# Patient Record
Sex: Female | Born: 1965 | Race: White | Hispanic: No | Marital: Married | Smoking: Former smoker
Health system: Southern US, Community
[De-identification: ages and names within clinical notes are randomized; demographics above are authoritative.]

## PROBLEM LIST (undated history)

## (undated) DIAGNOSIS — J189 Pneumonia, unspecified organism: Secondary | ICD-10-CM

## (undated) DIAGNOSIS — I6529 Occlusion and stenosis of unspecified carotid artery: Secondary | ICD-10-CM

## (undated) DIAGNOSIS — I2510 Atherosclerotic heart disease of native coronary artery without angina pectoris: Secondary | ICD-10-CM

## (undated) DIAGNOSIS — E119 Type 2 diabetes mellitus without complications: Secondary | ICD-10-CM

## (undated) DIAGNOSIS — F32A Depression, unspecified: Secondary | ICD-10-CM

## (undated) DIAGNOSIS — R0602 Shortness of breath: Secondary | ICD-10-CM

## (undated) DIAGNOSIS — Z951 Presence of aortocoronary bypass graft: Secondary | ICD-10-CM

## (undated) DIAGNOSIS — F1991 Other psychoactive substance use, unspecified, in remission: Secondary | ICD-10-CM

## (undated) DIAGNOSIS — E538 Deficiency of other specified B group vitamins: Secondary | ICD-10-CM

## (undated) DIAGNOSIS — E785 Hyperlipidemia, unspecified: Secondary | ICD-10-CM

## (undated) DIAGNOSIS — F419 Anxiety disorder, unspecified: Secondary | ICD-10-CM

## (undated) DIAGNOSIS — T7840XA Allergy, unspecified, initial encounter: Secondary | ICD-10-CM

## (undated) DIAGNOSIS — R011 Cardiac murmur, unspecified: Secondary | ICD-10-CM

## (undated) DIAGNOSIS — Z87442 Personal history of urinary calculi: Secondary | ICD-10-CM

## (undated) DIAGNOSIS — I4891 Unspecified atrial fibrillation: Secondary | ICD-10-CM

## (undated) DIAGNOSIS — M1990 Unspecified osteoarthritis, unspecified site: Secondary | ICD-10-CM

## (undated) DIAGNOSIS — I639 Cerebral infarction, unspecified: Secondary | ICD-10-CM

## (undated) DIAGNOSIS — Z83438 Family history of other disorder of lipoprotein metabolism and other lipidemia: Secondary | ICD-10-CM

## (undated) DIAGNOSIS — I10 Essential (primary) hypertension: Secondary | ICD-10-CM

## (undated) DIAGNOSIS — E669 Obesity, unspecified: Secondary | ICD-10-CM

## (undated) DIAGNOSIS — R079 Chest pain, unspecified: Secondary | ICD-10-CM

## (undated) HISTORY — DX: Hyperlipidemia, unspecified: E78.5

## (undated) HISTORY — DX: Family history of other disorder of lipoprotein metabolism and other lipidemia: Z83.438

## (undated) HISTORY — DX: Other psychoactive substance use, unspecified, in remission: F19.91

## (undated) HISTORY — DX: Chest pain, unspecified: R07.9

## (undated) HISTORY — DX: Cerebral infarction, unspecified: I63.9

## (undated) HISTORY — DX: Anxiety disorder, unspecified: F41.9

## (undated) HISTORY — DX: Deficiency of other specified B group vitamins: E53.8

## (undated) HISTORY — DX: Shortness of breath: R06.02

## (undated) HISTORY — PX: DILATION AND CURETTAGE OF UTERUS: SHX78

## (undated) HISTORY — DX: Occlusion and stenosis of unspecified carotid artery: I65.29

## (undated) HISTORY — DX: Allergy, unspecified, initial encounter: T78.40XA

## (undated) HISTORY — DX: Obesity, unspecified: E66.9

## (undated) HISTORY — DX: Presence of aortocoronary bypass graft: Z95.1

## (undated) HISTORY — DX: Cardiac murmur, unspecified: R01.1

---

## 1998-05-23 ENCOUNTER — Encounter: Admission: RE | Admit: 1998-05-23 | Discharge: 1998-08-21 | Payer: Self-pay | Admitting: *Deleted

## 1998-12-29 ENCOUNTER — Inpatient Hospital Stay (HOSPITAL_COMMUNITY): Admission: AD | Admit: 1998-12-29 | Discharge: 1998-12-31 | Payer: Self-pay | Admitting: *Deleted

## 1999-01-01 ENCOUNTER — Encounter (HOSPITAL_COMMUNITY): Admission: RE | Admit: 1999-01-01 | Discharge: 1999-04-01 | Payer: Self-pay | Admitting: *Deleted

## 1999-04-05 ENCOUNTER — Encounter (HOSPITAL_COMMUNITY): Admission: RE | Admit: 1999-04-05 | Discharge: 1999-07-04 | Payer: Self-pay | Admitting: *Deleted

## 1999-07-27 ENCOUNTER — Encounter (HOSPITAL_COMMUNITY): Admission: RE | Admit: 1999-07-27 | Discharge: 1999-10-25 | Payer: Self-pay | Admitting: *Deleted

## 1999-10-27 ENCOUNTER — Encounter: Admission: RE | Admit: 1999-10-27 | Discharge: 2000-01-25 | Payer: Self-pay | Admitting: *Deleted

## 2000-02-24 ENCOUNTER — Encounter: Admission: RE | Admit: 2000-02-24 | Discharge: 2000-05-24 | Payer: Self-pay | Admitting: *Deleted

## 2000-05-26 ENCOUNTER — Encounter: Admission: RE | Admit: 2000-05-26 | Discharge: 2000-08-24 | Payer: Self-pay | Admitting: *Deleted

## 2000-08-25 ENCOUNTER — Encounter: Admission: RE | Admit: 2000-08-25 | Discharge: 2000-11-02 | Payer: Self-pay | Admitting: *Deleted

## 2002-04-26 ENCOUNTER — Ambulatory Visit (HOSPITAL_COMMUNITY): Admission: RE | Admit: 2002-04-26 | Discharge: 2002-04-26 | Payer: Self-pay | Admitting: Obstetrics and Gynecology

## 2002-04-26 ENCOUNTER — Encounter: Payer: Self-pay | Admitting: *Deleted

## 2006-04-05 ENCOUNTER — Encounter: Admission: RE | Admit: 2006-04-05 | Discharge: 2006-04-05 | Payer: Self-pay | Admitting: Internal Medicine

## 2009-09-14 HISTORY — PX: HYSTEROSCOPY W/ ENDOMETRIAL ABLATION: SUR665

## 2009-09-23 ENCOUNTER — Observation Stay (HOSPITAL_COMMUNITY): Admission: AD | Admit: 2009-09-23 | Discharge: 2009-09-25 | Payer: Self-pay | Admitting: Obstetrics and Gynecology

## 2009-09-30 ENCOUNTER — Ambulatory Visit (HOSPITAL_COMMUNITY): Admission: RE | Admit: 2009-09-30 | Discharge: 2009-09-30 | Payer: Self-pay | Admitting: Obstetrics & Gynecology

## 2009-10-27 ENCOUNTER — Inpatient Hospital Stay (HOSPITAL_COMMUNITY): Admission: AD | Admit: 2009-10-27 | Discharge: 2009-10-27 | Payer: Self-pay | Admitting: Obstetrics

## 2009-11-06 ENCOUNTER — Other Ambulatory Visit: Payer: Self-pay | Admitting: Obstetrics & Gynecology

## 2009-11-06 ENCOUNTER — Observation Stay (HOSPITAL_COMMUNITY): Admission: AD | Admit: 2009-11-06 | Discharge: 2009-11-06 | Payer: Self-pay | Admitting: Obstetrics & Gynecology

## 2009-11-12 ENCOUNTER — Telehealth: Payer: Self-pay

## 2010-11-30 LAB — CBC
HCT: 21.7 % — ABNORMAL LOW (ref 36.0–46.0)
HCT: 28.2 % — ABNORMAL LOW (ref 36.0–46.0)
Hemoglobin: 7.2 g/dL — ABNORMAL LOW (ref 12.0–15.0)
MCHC: 33.3 g/dL (ref 30.0–36.0)
MCHC: 33.4 g/dL (ref 30.0–36.0)
MCV: 88.8 fL (ref 78.0–100.0)
MCV: 90.2 fL (ref 78.0–100.0)
MCV: 91.5 fL (ref 78.0–100.0)
Platelets: 281 10*3/uL (ref 150–400)
Platelets: 354 10*3/uL (ref 150–400)
RBC: 2.44 MIL/uL — ABNORMAL LOW (ref 3.87–5.11)
RBC: 3.45 MIL/uL — ABNORMAL LOW (ref 3.87–5.11)
RDW: 17.6 % — ABNORMAL HIGH (ref 11.5–15.5)
RDW: 18.3 % — ABNORMAL HIGH (ref 11.5–15.5)
WBC: 10.6 10*3/uL — ABNORMAL HIGH (ref 4.0–10.5)
WBC: 12.4 10*3/uL — ABNORMAL HIGH (ref 4.0–10.5)

## 2010-11-30 LAB — CROSSMATCH
ABO/RH(D): O POS
Antibody Screen: NEGATIVE

## 2010-11-30 LAB — ABO/RH: ABO/RH(D): O POS

## 2010-11-30 LAB — PREGNANCY, URINE: Preg Test, Ur: NEGATIVE

## 2010-11-30 LAB — DIFFERENTIAL
Basophils Relative: 0 % (ref 0–1)
Eosinophils Absolute: 0.1 10*3/uL (ref 0.0–0.7)
Eosinophils Relative: 1 % (ref 0–5)
Lymphs Abs: 2.8 10*3/uL (ref 0.7–4.0)
Neutrophils Relative %: 72 % (ref 43–77)

## 2010-11-30 LAB — HEMOGLOBIN AND HEMATOCRIT, BLOOD
HCT: 23 % — ABNORMAL LOW (ref 36.0–46.0)
Hemoglobin: 7.6 g/dL — ABNORMAL LOW (ref 12.0–15.0)

## 2010-12-04 LAB — HEMOGLOBIN AND HEMATOCRIT, BLOOD: HCT: 33.1 % — ABNORMAL LOW (ref 36.0–46.0)

## 2010-12-04 LAB — CROSSMATCH: Antibody Screen: NEGATIVE

## 2010-12-04 LAB — CBC
Hemoglobin: 8.2 g/dL — ABNORMAL LOW (ref 12.0–15.0)
Platelets: 344 10*3/uL (ref 150–400)
Platelets: 345 10*3/uL (ref 150–400)
RBC: 2.67 MIL/uL — ABNORMAL LOW (ref 3.87–5.11)
RDW: 15.2 % (ref 11.5–15.5)
WBC: 11.5 10*3/uL — ABNORMAL HIGH (ref 4.0–10.5)
WBC: 9.7 10*3/uL (ref 4.0–10.5)

## 2010-12-04 LAB — PREGNANCY, URINE: Preg Test, Ur: NEGATIVE

## 2011-02-17 ENCOUNTER — Ambulatory Visit (INDEPENDENT_AMBULATORY_CARE_PROVIDER_SITE_OTHER): Payer: BC Managed Care – PPO | Admitting: Family Medicine

## 2011-02-17 ENCOUNTER — Encounter: Payer: Self-pay | Admitting: Family Medicine

## 2011-02-17 DIAGNOSIS — J309 Allergic rhinitis, unspecified: Secondary | ICD-10-CM

## 2011-02-17 DIAGNOSIS — F419 Anxiety disorder, unspecified: Secondary | ICD-10-CM | POA: Insufficient documentation

## 2011-02-17 DIAGNOSIS — J302 Other seasonal allergic rhinitis: Secondary | ICD-10-CM | POA: Insufficient documentation

## 2011-02-17 DIAGNOSIS — F411 Generalized anxiety disorder: Secondary | ICD-10-CM

## 2011-02-17 DIAGNOSIS — R011 Cardiac murmur, unspecified: Secondary | ICD-10-CM

## 2011-02-17 MED ORDER — FLUTICASONE PROPIONATE 50 MCG/ACT NA SUSP: 1.0000 | Freq: Every day | NASAL | Status: DC

## 2011-02-17 MED ORDER — CLONAZEPAM 0.5 MG PO TABS: ORAL_TABLET | ORAL | Status: DC

## 2011-02-17 NOTE — Progress Notes (Signed)
  Subjective:     Penny Huerta is a 45 y.o. female who presents for new evaluation and treatment of anxiety disorder. She has the following anxiety symptoms: difficulty concentrating, dizziness, feelings of losing control and psychomotor agitation. Onset of symptoms was approximately several years ago. Symptoms have been gradually improving since that time. She denies current suicidal and homicidal ideation. Family history significant for no psychiatric illness. Risk factors: hx anxiety and panic attack . Previous treatment includes benzodiazepines klonopin. She complains of the following medication side effects: none. The following portions of the patient's history were reviewed and updated as appropriate: allergies, current medications, past family history, past medical history, past social history, past surgical history and problem list.  Review of Systems Pertinent items are noted in HPI.    Objective:    BP 124/72  Pulse 89  Temp(Src) 98.1 F (36.7 C) (Oral)  Ht 5' 6.25" (1.683 m)  Wt 271 lb 9.6 oz (123.197 kg)  BMI 43.51 kg/m2  SpO2 98%  LMP 01/26/2011 General appearance: alert, cooperative, appears stated age and no distress Lungs: clear to auscultation bilaterally Heart: regular rate and rhythm, S1, S2 normal, no murmur, click, rub or gallop Neurologic: Alert and oriented X 3, normal strength and tone. Normal symmetric reflexes. Normal coordination and gait psych-- not suicidal, not anxious    Assessment:    anxiety . Possible organic contributing causes are: none.  Seasonal allergies Heart murmur Plan:    Medications: benzodiazepines klonopin. Handouts describing disease, natural history, and treatment were given to the patient. Instructed patient to contact office or on-call physician promptly should condition worsen or any new symptoms appear and provided on-call telephone numbers. IF THE PATIENT HAS ANY SUICIDAL OR HOMICIDAL IDEATIONS, CALL THE OFFICE, DISCUSS WITH A  SUPPORT MEMBER, OR GO TO THE ER IMMEDIATELY. Patient was agreeable with this plan. Follow up: 3 months.  con't flonase 2D echo

## 2011-02-17 NOTE — Patient Instructions (Signed)
Anxiety and Panic Attacks Your caregiver has informed you that you are having an anxiety or panic attack. There may be many forms of this. Most of the time these attacks come suddenly and without warning. They come at any time of day, including periods of sleep, and at any time of life. They may be strong and unexplained. Although panic attacks are very scary, they are physically harmless. Sometimes the cause of your anxiety is not known. Anxiety is a protective mechanism of the body in its fight or flight mechanism. Most of these perceived danger situations are actually nonphysical situations (such as anxiety over losing a job). CAUSES The causes of an anxiety or panic attack are many. Panic attacks may occur in otherwise healthy people given a certain set of circumstances. There may be a genetic cause for panic attacks. Some medications may also have anxiety as a side effect. SYMPTOMS Some of the most common feelings are:  Intense terror.  Dizziness, feeling faint.   Hot and cold flashes.   Fear of going crazy.   Feelings that nothing is real.   Sweating.   Shaking.   Chest pain or a fast heartbeat (palpitations).  Smothering, choking sensations.   Feelings of impending doom and that death is near.   Tingling of extremities, this may be from over breathing.   Altered reality (derealization).   Being detached from yourself (depersonalization).   Several symptoms can be present to make up anxiety or panic attacks. DIAGNOSIS The evaluation by your caregiver will depend on the type of symptoms you are experiencing. The diagnosis of anxiety or pain attack is made when no physical illness can be determined to be a cause of the symptoms. TREATMENT Treatment to prevent anxiety and panic attacks may include:  Avoidance of circumstances that cause anxiety.   Reassurance and relaxation.   Regular exercise.   Relaxation therapies, such as yoga.   Psychotherapy with a psychiatrist  or therapist.   Avoidance of caffeine, alcohol and illegal drugs.   Prescribed medication.  SEEK IMMEDIATE MEDICAL CARE IF:  You experience panic attack symptoms that are different than your usual symptoms.   You have any worsening or concerning symptoms.  Document Released: 08/31/2005 Document Re-Released: 02/18/2010 [employer] Patient Information 2011 [employer]. 

## 2011-02-24 ENCOUNTER — Other Ambulatory Visit (HOSPITAL_COMMUNITY): Payer: Self-pay | Admitting: Family Medicine

## 2011-02-24 DIAGNOSIS — R011 Cardiac murmur, unspecified: Secondary | ICD-10-CM

## 2011-02-26 ENCOUNTER — Ambulatory Visit (HOSPITAL_COMMUNITY): Payer: BC Managed Care – PPO

## 2011-03-10 ENCOUNTER — Other Ambulatory Visit: Payer: Self-pay | Admitting: Cardiology

## 2011-03-10 ENCOUNTER — Ambulatory Visit (INDEPENDENT_AMBULATORY_CARE_PROVIDER_SITE_OTHER): Payer: BC Managed Care – PPO | Admitting: *Deleted

## 2011-03-10 DIAGNOSIS — R011 Cardiac murmur, unspecified: Secondary | ICD-10-CM

## 2011-03-10 DIAGNOSIS — R002 Palpitations: Secondary | ICD-10-CM

## 2011-05-21 ENCOUNTER — Encounter: Payer: Self-pay | Admitting: Family Medicine

## 2011-05-21 ENCOUNTER — Ambulatory Visit (INDEPENDENT_AMBULATORY_CARE_PROVIDER_SITE_OTHER): Payer: BC Managed Care – PPO | Admitting: Family Medicine

## 2011-05-21 VITALS — BP 110/74 | HR 85 | Temp 99.1°F | Ht 67.0 in | Wt 269.4 lb

## 2011-05-21 DIAGNOSIS — Z1231 Encounter for screening mammogram for malignant neoplasm of breast: Secondary | ICD-10-CM

## 2011-05-21 DIAGNOSIS — J302 Other seasonal allergic rhinitis: Secondary | ICD-10-CM

## 2011-05-21 DIAGNOSIS — Z0000 Encounter for general adult medical examination without abnormal findings: Secondary | ICD-10-CM

## 2011-05-21 DIAGNOSIS — J309 Allergic rhinitis, unspecified: Secondary | ICD-10-CM

## 2011-05-21 LAB — CBC WITH DIFFERENTIAL/PLATELET
Basophils Absolute: 0 10*3/uL (ref 0.0–0.1)
Basophils Relative: 0.4 % (ref 0.0–3.0)
Eosinophils Relative: 0.8 % (ref 0.0–5.0)
Hemoglobin: 13.3 g/dL (ref 12.0–15.0)
Lymphocytes Relative: 32.6 % (ref 12.0–46.0)
Monocytes Relative: 6.1 % (ref 3.0–12.0)
Neutro Abs: 4.7 10*3/uL (ref 1.4–7.7)
RBC: 4.43 Mil/uL (ref 3.87–5.11)
RDW: 13.8 % (ref 11.5–14.6)
WBC: 7.9 10*3/uL (ref 4.5–10.5)

## 2011-05-21 LAB — HEPATIC FUNCTION PANEL
AST: 22 U/L (ref 0–37)
Albumin: 3.7 g/dL (ref 3.5–5.2)
Alkaline Phosphatase: 64 U/L (ref 39–117)

## 2011-05-21 LAB — BASIC METABOLIC PANEL
Calcium: 8.8 mg/dL (ref 8.4–10.5)
GFR: 119.97 mL/min (ref >60.00)
Sodium: 141 mEq/L (ref 135–145)

## 2011-05-21 LAB — LDL CHOLESTEROL, DIRECT: Direct LDL: 228 mg/dL

## 2011-05-21 LAB — LIPID PANEL: VLDL: 42 mg/dL — ABNORMAL HIGH (ref 0.0–40.0)

## 2011-05-21 NOTE — Progress Notes (Signed)
Subjective:     Penny Huerta is a 45 y.o. female and is here for a comprehensive physical exam. The patient reports no problems.  History   Social History  . Marital Status: Married    Spouse Name: N/A    Number of Children: N/A  . Years of Education: N/A   Occupational History  . educating Curator    Social History Main Topics  . Smoking status: Never Smoker   . Smokeless tobacco: Never Used  . Alcohol Use: No  . Drug Use: No  . Sexually Active: Yes -- Female partner(s)   Other Topics Concern  . Not on file   Social History Narrative  . No narrative on file   Health Maintenance  Topic Date Due  . Pap Smear  09/19/2012  . Tetanus/tdap  09/14/2013    The following portions of the patient's history were reviewed and updated as appropriate: allergies, current medications, past family history, past medical history, past social history, past surgical history and problem list.  Review of Systems Review of Systems  Constitutional: Negative for activity change, appetite change and fatigue.  HENT: Negative for hearing loss, congestion, tinnitus and ear discharge.  dentist ---due Eyes: Negative for visual disturbance (see optho q2y -- vision 20/20 =-- has reading glasses.  Respiratory: Negative for cough, chest tightness and shortness of breath.   Cardiovascular: Negative for chest pain, palpitations and leg swelling.  Gastrointestinal: Negative for abdominal pain, diarrhea, constipation and abdominal distention.  Genitourinary: Negative for urgency, frequency, decreased urine volume and difficulty urinating.  Musculoskeletal: Negative for back pain, arthralgias and gait problem.  Skin: Negative for color change, pallor and rash.  Neurological: Negative for dizziness, light-headedness, numbness and headaches.  Hematological: Negative for adenopathy. Does not bruise/bleed easily.  Psychiatric/Behavioral: Negative for suicidal ideas, confusion, sleep  disturbance, self-injury, dysphoric mood, decreased concentration and agitation.   Pt sees derm in [place]    Objective:    BP 110/74  Pulse 85  Temp(Src) 99.1 F (37.3 C) (Oral)  Ht 5\' 7"  (1.702 m)  Wt 269 lb 6.4 oz (122.199 kg)  BMI 42.19 kg/m2  SpO2 96%  General Appearance:    Alert, cooperative, no distress, appears stated age  Head:    Normocephalic, without obvious abnormality, atraumatic  Eyes:    PERRL, conjunctiva/corneas clear, EOM's intact, fundi    benign, both eyes  Ears:    Normal TM's and external ear canals, both ears  Nose:   Nares normal, septum midline, mucosa normal, no drainage    or sinus tenderness  Throat:   Lips, mucosa, and tongue normal; teeth and gums normal  Neck:   Supple, symmetrical, trachea midline, no adenopathy;    thyroid:  no enlargement/tenderness/nodules; no carotid   bruit or JVD  Back:     Symmetric, no curvature, ROM normal, no CVA tenderness  Lungs:     Clear to auscultation bilaterally, respirations unlabored  Chest Wall:    No tenderness or deformity   Heart:    Regular rate and rhythm, S1 and S2 normal, no murmur, rub   or gallop  Breast Exam:    gyn  Abdomen:     Soft, non-tender, bowel sounds active all four quadrants,    no masses, no organomegaly  Genitalia:    gyn  Rectal:  gyn  Extremities:   Extremities normal, atraumatic, no cyanosis or edema  Pulses:   2+ and symmetric all extremities  Skin:   Skin color, texture,  turgor normal, no rashes or lesions  Lymph nodes:   Cervical, supraclavicular, and axillary nodes normal  Neurologic:   CNII-XII intact, normal strength, sensation and reflexes    throughout   Assessment:    Healthy female exam.  Seasonal allergies   Plan:  ghm utd Check fasting labs   See After Visit Summary for Counseling Recommendations

## 2011-05-21 NOTE — Patient Instructions (Signed)

## 2011-05-27 ENCOUNTER — Telehealth: Payer: Self-pay

## 2011-05-27 NOTE — Telephone Encounter (Signed)
Message copied by Arnette Norris on Wed May 27, 2011  9:13 AM ------      Message from: Lelon Perla      Created: Tue May 26, 2011  9:53 PM       Pt at high risk MI and stroke------Cholesterol--- LDL goal <100,  HDL >40,  TG < 150.  Diet and exercise will increase HDL and decrease LDL and TG.  Fish,  Fish Oil, Flaxseed oil will also help increase the HDL and decrease Triglycerides.   Start Crestor 20 mg #30  1 po qhs, 2 refills.    Recheck labs in 3 months.---------    HDL diagnostics,  Lipid, hep, bmp, hgba1c   272.4  790.6

## 2011-05-27 NOTE — Telephone Encounter (Signed)
Dicussed with patient and she stated she was Absolutley Not taking any medications for her cholesterol. She stated her cholesterol has been high since she was a child and medications in the past caused he so many issues and she was unable to walk. She has been working with an Actor at [employer] to help her get it under control. I discussed the risks with patient of MI and Stroke and she voiced understanding, she stated she would take some diet information but will not take any medications. I advised I would let Dr.Lowne Know.        KP

## 2011-05-27 NOTE — Telephone Encounter (Signed)
noted 

## 2011-06-04 ENCOUNTER — Ambulatory Visit
Admission: RE | Admit: 2011-06-04 | Discharge: 2011-06-04 | Disposition: A | Discharge status: Home / Self Care | Payer: BC Managed Care – PPO | Source: Ambulatory Visit | Attending: Family Medicine | Admitting: Family Medicine

## 2011-06-04 DIAGNOSIS — Z1231 Encounter for screening mammogram for malignant neoplasm of breast: Secondary | ICD-10-CM

## 2011-08-19 ENCOUNTER — Other Ambulatory Visit: Payer: Self-pay | Admitting: Family Medicine

## 2011-08-19 NOTE — Telephone Encounter (Signed)
Faxed.   KP 

## 2011-08-19 NOTE — Telephone Encounter (Signed)
Last seen 05/21/11 and filled 02/17/11 # 45 with 2 refills. Please advise    KP

## 2011-10-22 ENCOUNTER — Encounter: Payer: Self-pay | Admitting: Family Medicine

## 2011-10-22 ENCOUNTER — Ambulatory Visit (INDEPENDENT_AMBULATORY_CARE_PROVIDER_SITE_OTHER): Payer: BC Managed Care – PPO | Admitting: Family Medicine

## 2011-10-22 VITALS — BP 122/84 | HR 87 | Temp 98.8°F | Wt 264.0 lb

## 2011-10-22 DIAGNOSIS — J329 Chronic sinusitis, unspecified: Secondary | ICD-10-CM

## 2011-10-22 MED ORDER — GUAIFENESIN-CODEINE 100-10 MG/5ML PO SYRP: 5.0000 mL | ORAL_SOLUTION | Freq: Three times a day (TID) | ORAL | Status: AC | PRN

## 2011-10-22 MED ORDER — AMOXICILLIN-POT CLAVULANATE 875-125 MG PO TABS: 1.0000 | ORAL_TABLET | Freq: Two times a day (BID) | ORAL | Status: AC

## 2011-10-22 NOTE — Progress Notes (Signed)
  Subjective:     Penny Huerta is a 46 y.o. female who presents for evaluation of symptoms of a URI. Symptoms include congestion, facial pain, nasal congestion, no  fever, non productive cough, purulent nasal discharge, sinus pressure and sore throat. Onset of symptoms was 1 week ago, and has been gradually worsening since that time. Treatment to date: cough suppressants and decongestants.  The following portions of the patient's history were reviewed and updated as appropriate: allergies, current medications, past family history, past medical history, past social history, past surgical history and problem list.  Review of Systems Pertinent items are noted in HPI.   Objective:    BP 122/84  Pulse 87  Temp(Src) 98.8 F (37.1 C) (Oral)  Wt 264 lb (119.75 kg)  SpO2 95% General appearance: alert, cooperative, appears stated age and no distress Ears: tm dull b/l  Nose: green discharge, moderate congestion, turbinates red, swollen, edematous, sinus tenderness bilateral Throat: lips, mucosa, and tongue normal; teeth and gums normal Neck: mild anterior cervical adenopathy, supple, symmetrical, trachea midline and thyroid not enlarged, symmetric, no tenderness/mass/nodules Lungs: clear to auscultation bilaterally Heart: S1, S2 normal Extremities: extremities normal, atraumatic, no cyanosis or edema   Assessment:    sinusitis   Plan:    Discussed the diagnosis and treatment of sinusitis. Suggested symptomatic OTC remedies. Nasal saline spray for congestion. Augmentin per orders. Nasal steroids per orders. Follow up as needed.

## 2011-10-22 NOTE — Patient Instructions (Signed)

## 2011-12-27 ENCOUNTER — Other Ambulatory Visit: Payer: Self-pay | Admitting: Family Medicine

## 2012-01-27 ENCOUNTER — Encounter: Payer: Self-pay | Admitting: Family Medicine

## 2012-01-27 ENCOUNTER — Telehealth: Payer: Self-pay | Admitting: Family Medicine

## 2012-01-27 ENCOUNTER — Ambulatory Visit (INDEPENDENT_AMBULATORY_CARE_PROVIDER_SITE_OTHER): Payer: BC Managed Care – PPO | Admitting: Family Medicine

## 2012-01-27 VITALS — BP 151/98 | HR 86 | Temp 98.6°F | Ht 67.0 in | Wt 268.0 lb

## 2012-01-27 DIAGNOSIS — L559 Sunburn, unspecified: Secondary | ICD-10-CM

## 2012-01-27 NOTE — Telephone Encounter (Addendum)
Per call from CAN, patient called in, was told our schedules are full for today.  Per CAN, patient with severe sunburn of both legs with swelling.  Patient to leave out of town for a 10 hour drive/ride tomorrow, and states patient should be seen within 72 hours.  Please return patient's call at [phone].

## 2012-01-27 NOTE — Telephone Encounter (Signed)
Discussed with patient and she stated she could not come in tomorrow. I made her aware she can try calling the [facility] office to be seen today and she agreed. I gave her the number and she said she would call.      KP

## 2012-01-27 NOTE — Telephone Encounter (Signed)
Caller: Ellissa/Patient; PCP: Lelon Perla.; CB#: [phone];  Calling today 01/27/12 regarding Sunburn On Leg, States Feet &. Ankles Swollen.  She is going to be traveling in a car tomorrow x10 hours.  Wants to know what she should do.  Got sunburn on Saturday.  Has not had a sunburn this bad before.  No blistering.  Emergent symptoms r/o by Sunburn guidelines with exception of sunburn worse than usual for similar sun exposure and on prescription medicaiton.  Care advice given. Advised pt she should be seen before going on her trip tomorrow.  No appts available at office, advised pt to urgent care for evaluation.  [facility] contacted office and spoke with Ranea to make sure no appts had been cancelled.  She took pt name and phone number and will give her a call to see if she can be seen in [facility] office.

## 2012-01-27 NOTE — Telephone Encounter (Signed)
Pt needs appointment today or early tomorrow

## 2012-01-27 NOTE — Progress Notes (Signed)
OFFICE NOTE  01/27/2012  CC:  Chief Complaint  Patient presents with  . Sunburn    calves/feet worse, swollen feet; concerned about swelling in feet as she has a 10 hour drive tomorrow; using Aloe; happened Saturday     HPI: Patient is a 46 y.o. Caucasian female patient of Dr. Ernst Spell who is here for sunburn. Five days ago was sitting in sun long time watching daughter play beach volleyball-- with SPF 45 applied once prior, no reapplication. Sun exposed areas, esp lower legs, got burnt and hurt a moderate amount. Some swollen anterior tibial areas initially have gone down and now ankles/tops of feet a bit swollen. No calf pain.  She wonders about going on a long car trip tomorrow and whether any special things need to be done.   Pertinent PMH:  Past Medical History  Diagnosis Date  . Allergy   . Hyperlipidemia   . Heart murmur     ARRHYTHMIA  . Anxiety     MEDS:  Outpatient Prescriptions Prior to Visit  Medication Sig Dispense Refill  . clonazePAM (KLONOPIN) 0.5 MG tablet take 1/2 tablet by mouth three times a day if needed  45 tablet  2  . fluticasone (FLONASE) 50 MCG/ACT nasal spray instill 1 spray into each nostril once daily  16 g  5  . metroNIDAZOLE (METROCREAM) 0.75 % cream Apply 1 application topically 2 (two) times daily.        . Nutritional Supplements (NUTRITIONAL SUPPLEMENT PO) Take 2 scoop by mouth daily.        . Pseudoephedrine-Ibuprofen (ADVIL COLD/SINUS PO) Take 1 tablet by mouth as needed.          PE: Blood pressure 151/98, pulse 86, temperature 98.6 F (37 C), temperature source Temporal, height 5\' 7"  (1.702 m), weight 268 lb (121.564 kg), last menstrual period 01/06/2012. Gen: Alert, well appearing.  Patient is oriented to person, place, time, and situation. Pleasant affect. Deep erythematous hue to anteior tibial areas, area around neck, sun-exposed areas of arms.  No vesicles or visible peeling.  No bruising.  No pitting edmea.   Her ankles and tops  of feet have a bit of doughy edema.  IMPRESSION AND PLAN: Sunburn: first degree. Continue advil tid with food. Cold compresses prn, elevate legs prn. For her car ride tomorrow I recommended she get up and ambulate q2h and she asked for a letter written to her husband to get him to do this.  FOLLOW UP: prn

## 2012-02-12 ENCOUNTER — Telehealth: Payer: Self-pay

## 2012-02-12 NOTE — Telephone Encounter (Signed)
Patient walked in to the office and state dsh would like Clonazepam removed form her medication list.

## 2012-02-14 ENCOUNTER — Other Ambulatory Visit: Payer: Self-pay | Admitting: Family Medicine

## 2012-02-14 NOTE — Telephone Encounter (Signed)
If she is not taking it we can remove it

## 2012-02-15 NOTE — Telephone Encounter (Signed)
Spoke with patient 6.3.13, advised that Dr.Lowne did approve removal of Clonazepam as per patient request. Patient stated she discontinued medication 08/2011

## 2012-10-29 ENCOUNTER — Other Ambulatory Visit: Payer: Self-pay

## 2012-11-05 ENCOUNTER — Other Ambulatory Visit: Payer: Self-pay | Admitting: Internal Medicine

## 2013-01-05 ENCOUNTER — Other Ambulatory Visit: Payer: Self-pay | Admitting: Family Medicine

## 2013-03-10 ENCOUNTER — Encounter: Payer: Self-pay | Admitting: Family Medicine

## 2013-03-10 ENCOUNTER — Ambulatory Visit (INDEPENDENT_AMBULATORY_CARE_PROVIDER_SITE_OTHER): Payer: BC Managed Care – PPO | Admitting: Family Medicine

## 2013-03-10 VITALS — BP 124/82 | HR 85 | Temp 98.3°F | Ht 66.5 in | Wt 271.8 lb

## 2013-03-10 DIAGNOSIS — Z0000 Encounter for general adult medical examination without abnormal findings: Secondary | ICD-10-CM

## 2013-03-10 DIAGNOSIS — Z9109 Other allergy status, other than to drugs and biological substances: Secondary | ICD-10-CM

## 2013-03-10 DIAGNOSIS — J309 Allergic rhinitis, unspecified: Secondary | ICD-10-CM

## 2013-03-10 DIAGNOSIS — J302 Other seasonal allergic rhinitis: Secondary | ICD-10-CM

## 2013-03-10 LAB — HEPATIC FUNCTION PANEL
ALT: 32 U/L (ref 0–35)
AST: 23 U/L (ref 0–37)
Albumin: 3.7 g/dL (ref 3.5–5.2)
Total Bilirubin: 0.7 mg/dL (ref 0.3–1.2)

## 2013-03-10 LAB — CBC WITH DIFFERENTIAL/PLATELET
Basophils Absolute: 0 10*3/uL (ref 0.0–0.1)
Eosinophils Relative: 0.7 % (ref 0.0–5.0)
HCT: 39.5 % (ref 36.0–46.0)
Hemoglobin: 13.2 g/dL (ref 12.0–15.0)
Lymphs Abs: 1.9 10*3/uL (ref 0.7–4.0)
MCV: 89.7 fl (ref 78.0–100.0)
Monocytes Absolute: 0.6 10*3/uL (ref 0.1–1.0)
Neutro Abs: 3.9 10*3/uL (ref 1.4–7.7)
Platelets: 243 10*3/uL (ref 150.0–400.0)
RDW: 13.3 % (ref 11.5–14.6)

## 2013-03-10 LAB — BASIC METABOLIC PANEL
BUN: 10 mg/dL (ref 6–23)
Chloride: 103 mEq/L (ref 96–112)
Glucose, Bld: 96 mg/dL (ref 70–99)
Potassium: 3.8 mEq/L (ref 3.5–5.1)
Sodium: 136 mEq/L (ref 135–145)

## 2013-03-10 LAB — POCT URINALYSIS DIPSTICK
Bilirubin, UA: NEGATIVE
Blood, UA: NEGATIVE
Glucose, UA: NEGATIVE
Ketones, UA: NEGATIVE
Nitrite, UA: NEGATIVE
Spec Grav, UA: 1.015
pH, UA: 6.5

## 2013-03-10 LAB — LIPID PANEL
Cholesterol: 295 mg/dL — ABNORMAL HIGH (ref 0–200)
Total CHOL/HDL Ratio: 11
Triglycerides: 292 mg/dL — ABNORMAL HIGH (ref 0.0–149.0)

## 2013-03-10 LAB — MICROALBUMIN / CREATININE URINE RATIO
Creatinine,U: 274.8 mg/dL
Microalb Creat Ratio: 0.4 mg/g (ref 0.0–30.0)

## 2013-03-10 LAB — LDL CHOLESTEROL, DIRECT: Direct LDL: 214.4 mg/dL

## 2013-03-10 LAB — TSH: TSH: 1.32 u[IU]/mL (ref 0.35–5.50)

## 2013-03-10 MED ORDER — FLUTICASONE PROPIONATE 50 MCG/ACT NA SUSP: NASAL | Status: DC

## 2013-03-10 NOTE — Assessment & Plan Note (Signed)
Refill flonase

## 2013-03-10 NOTE — Progress Notes (Signed)
Subjective:     Penny Huerta is a 47 y.o. female and is here for a comprehensive physical exam. The patient reports no problems.  History   Social History  . Marital Status: Married    Spouse Name: Penny Huerta    Number of Children: Penny Huerta  . Years of Education: Penny Huerta   Occupational History  . educating Curator    Social History Main Topics  . Smoking status: Former Smoker -- 20 years    Types: Cigarettes    Quit date: 09/14/1997  . Smokeless tobacco: Never Used  . Alcohol Use: No  . Drug Use: No  . Sexually Active: Yes -- Female partner(s)   Other Topics Concern  . Not on file   Social History Narrative   Exercise-- none   Health Maintenance  Topic Date Due  . Mammogram  06/03/2012  . Influenza Vaccine  05/15/2013  . Tetanus/tdap  09/14/2013  . Pap Smear  02/24/2015    The following portions of the patient's history were reviewed and updated as appropriate:  She  has a past medical history of Allergy; Hyperlipidemia; Heart murmur; and Anxiety. She  does not have any pertinent problems on file. She  has past surgical history that includes Hysteroscopy w/ endometrial ablation (2011). Her family history includes Diabetes in her mother and paternal grandfather; Heart disease in her maternal grandfather and maternal grandmother; Hyperlipidemia in her maternal grandfather, maternal grandmother, and mother; Hypertension in her mother and paternal grandfather; Ovarian cancer in her paternal grandmother; Stroke in her paternal grandfather; Sudden death in her maternal uncle; and Transient ischemic attack in her father and mother. She  reports that she quit smoking about 15 years ago. Her smoking use included Cigarettes. She smoked 0.00 packs per day for 20 years. She has never used smokeless tobacco. She reports that she does not drink alcohol or use illicit drugs. She has a current medication list which includes the following prescription(s): fluticasone, ibuprofen, and  pseudoephedrine-ibuprofen. Current Outpatient Prescriptions on File Prior to Visit  Medication Sig Dispense Refill  . fluticasone (FLONASE) 50 MCG/ACT nasal spray 1 spray into each nostril once daily--- office visit due now  16 g  0  . ibuprofen (ADVIL,MOTRIN) 200 MG tablet Take 800 mg by mouth every 6 (six) hours as needed.      . Pseudoephedrine-Ibuprofen (ADVIL COLD/SINUS PO) Take 1 tablet by mouth as needed.         No current facility-administered medications on file prior to visit.   She is allergic to caffeine; alprazolam; citalopram hydrobromide; nitrofurantoin monohyd macro; statins; and sulfa drugs cross reactors..  Review of Systems Review of Systems  Constitutional: Negative for activity change, appetite change and fatigue.  HENT: Negative for hearing loss, congestion, tinnitus and ear discharge.  dentist last-- 1 1/2 years ago Eyes: Negative for visual disturbance (see optho --due) Respiratory: Negative for cough, chest tightness and shortness of breath.   Cardiovascular: Negative for chest pain, palpitations and leg swelling.  Gastrointestinal: Negative for abdominal pain, diarrhea, constipation and abdominal distention.  Genitourinary: Negative for urgency, frequency, decreased urine volume and difficulty urinating.  Musculoskeletal: Negative for back pain, arthralgias and gait problem.  Skin: Negative for color change, pallor and rash.  Neurological: Negative for dizziness, light-headedness, numbness and headaches.  Hematological: Negative for adenopathy. Does not bruise/bleed easily.  Psychiatric/Behavioral: Negative for suicidal ideas, confusion, sleep disturbance, self-injury, dysphoric mood, decreased concentration and agitation.       Objective:    BP 124/82  Pulse 85  Temp(Src) 98.3 F (36.8 C) (Oral)  Ht 5' 6.5" (1.689 m)  Wt 271 lb 12.8 oz (123.288 kg)  BMI 43.22 kg/m2  SpO2 96% General appearance: alert, cooperative, appears stated age and no  distress Head: Normocephalic, without obvious abnormality, atraumatic Eyes: conjunctivae/corneas clear. PERRL, EOM's intact. Fundi benign. Ears: normal TM's and external ear canals both ears Nose: Nares normal. Septum midline. Mucosa normal. No drainage or sinus tenderness. Throat: lips, mucosa, and tongue normal; teeth and gums normal Neck: no adenopathy, supple, symmetrical, trachea midline and thyroid not enlarged, symmetric, no tenderness/mass/nodules Back: symmetric, no curvature. ROM normal. No CVA tenderness. Lungs: clear to auscultation bilaterally Breasts: normal appearance, no masses or tenderness Heart: S1, S2 normal Abdomen: soft, non-tender; bowel sounds normal; no masses,  no organomegaly Pelvic: deferred--gyn Extremities: extremities normal, atraumatic, no cyanosis or edema Pulses: 2+ and symmetric Skin: Skin color, texture, turgor normal. No rashes or lesions Lymph nodes: Cervical, supraclavicular, and axillary nodes normal. Neurologic: Alert and oriented X 3, normal strength and tone. Normal symmetric reflexes. Normal coordination and gait pych--nodepression, no anxiety      Assessment:    Healthy female exam.      Plan:    ghm utd Checks labs See After Visit Summary for Counseling Recommendations

## 2013-03-10 NOTE — Patient Instructions (Signed)
Preventive Care for Adults, Female A healthy lifestyle and preventive care can promote health and wellness. Preventive health guidelines for women include the following key practices.  A routine yearly physical is a good way to check with your caregiver about your health and preventive screening. It is a chance to share any concerns and updates on your health, and to receive a thorough exam.  Visit your dentist for a routine exam and preventive care every 6 months. Brush your teeth twice a day and floss once a day. Good oral hygiene prevents tooth decay and gum disease.  The frequency of eye exams is based on your age, health, family medical history, use of contact lenses, and other factors. Follow your caregiver's recommendations for frequency of eye exams.  Eat a healthy diet. Foods like vegetables, fruits, whole grains, low-fat dairy products, and lean protein foods contain the nutrients you need without too many calories. Decrease your intake of foods high in solid fats, added sugars, and salt. Eat the right amount of calories for you.Get information about a proper diet from your caregiver, if necessary.  Regular physical exercise is one of the most important things you can do for your health. Most adults should get at least 150 minutes of moderate-intensity exercise (any activity that increases your heart rate and causes you to sweat) each week. In addition, most adults need muscle-strengthening exercises on 2 or more days a week.  Maintain a healthy weight. The body mass index (BMI) is a screening tool to identify possible weight problems. It provides an estimate of body fat based on height and weight. Your caregiver can help determine your BMI, and can help you achieve or maintain a healthy weight.For adults 20 years and older:  A BMI below 18.5 is considered underweight.  A BMI of 18.5 to 24.9 is normal.  A BMI of 25 to 29.9 is considered overweight.  A BMI of 30 and above is  considered obese.  Maintain normal blood lipids and cholesterol levels by exercising and minimizing your intake of saturated fat. Eat a balanced diet with plenty of fruit and vegetables. Blood tests for lipids and cholesterol should begin at age 20 and be repeated every 5 years. If your lipid or cholesterol levels are high, you are over 50, or you are at high risk for heart disease, you may need your cholesterol levels checked more frequently.Ongoing high lipid and cholesterol levels should be treated with medicines if diet and exercise are not effective.  If you smoke, find out from your caregiver how to quit. If you do not use tobacco, do not start.  If you are pregnant, do not drink alcohol. If you are breastfeeding, be very cautious about drinking alcohol. If you are not pregnant and choose to drink alcohol, do not exceed 1 drink per day. One drink is considered to be 12 ounces (355 mL) of beer, 5 ounces (148 mL) of wine, or 1.5 ounces (44 mL) of liquor.  Avoid use of street drugs. Do not share needles with anyone. Ask for help if you need support or instructions about stopping the use of drugs.  High blood pressure causes heart disease and increases the risk of stroke. Your blood pressure should be checked at least every 1 to 2 years. Ongoing high blood pressure should be treated with medicines if weight loss and exercise are not effective.  If you are 55 to 46 years old, ask your caregiver if you should take aspirin to prevent strokes.  Diabetes   screening involves taking a blood sample to check your fasting blood sugar level. This should be done once every 3 years, after age 45, if you are within normal weight and without risk factors for diabetes. Testing should be considered at a younger age or be carried out more frequently if you are overweight and have at least 1 risk factor for diabetes.  Breast cancer screening is essential preventive care for women. You should practice "breast  self-awareness." This means understanding the normal appearance and feel of your breasts and may include breast self-examination. Any changes detected, no matter how small, should be reported to a caregiver. Women in their 20s and 30s should have a clinical breast exam (CBE) by a caregiver as part of a regular health exam every 1 to 3 years. After age 40, women should have a CBE every year. Starting at age 40, women should consider having a mammography (breast X-ray test) every year. Women who have a family history of breast cancer should talk to their caregiver about genetic screening. Women at a high risk of breast cancer should talk to their caregivers about having magnetic resonance imaging (MRI) and a mammography every year.  The Pap test is a screening test for cervical cancer. A Pap test can show cell changes on the cervix that might become cervical cancer if left untreated. A Pap test is a procedure in which cells are obtained and examined from the lower end of the uterus (cervix).  Women should have a Pap test starting at age 21.  Between ages 21 and 29, Pap tests should be repeated every 2 years.  Beginning at age 30, you should have a Pap test every 3 years as long as the past 3 Pap tests have been normal.  Some women have medical problems that increase the chance of getting cervical cancer. Talk to your caregiver about these problems. It is especially important to talk to your caregiver if a new problem develops soon after your last Pap test. In these cases, your caregiver may recommend more frequent screening and Pap tests.  The above recommendations are the same for women who have or have not gotten the vaccine for human papillomavirus (HPV).  If you had a hysterectomy for a problem that was not cancer or a condition that could lead to cancer, then you no longer need Pap tests. Even if you no longer need a Pap test, a regular exam is a good idea to make sure no other problems are  starting.  If you are between ages 65 and 70, and you have had normal Pap tests going back 10 years, you no longer need Pap tests. Even if you no longer need a Pap test, a regular exam is a good idea to make sure no other problems are starting.  If you have had past treatment for cervical cancer or a condition that could lead to cancer, you need Pap tests and screening for cancer for at least 20 years after your treatment.  If Pap tests have been discontinued, risk factors (such as a new sexual partner) need to be reassessed to determine if screening should be resumed.  The HPV test is an additional test that may be used for cervical cancer screening. The HPV test looks for the virus that can cause the cell changes on the cervix. The cells collected during the Pap test can be tested for HPV. The HPV test could be used to screen women aged 30 years and older, and should   be used in women of any age who have unclear Pap test results. After the age of 30, women should have HPV testing at the same frequency as a Pap test.  Colorectal cancer can be detected and often prevented. Most routine colorectal cancer screening begins at the age of 50 and continues through age 75. However, your caregiver may recommend screening at an earlier age if you have risk factors for colon cancer. On a yearly basis, your caregiver may provide home test kits to check for hidden blood in the stool. Use of a small camera at the end of a tube, to directly examine the colon (sigmoidoscopy or colonoscopy), can detect the earliest forms of colorectal cancer. Talk to your caregiver about this at age 50, when routine screening begins. Direct examination of the colon should be repeated every 5 to 10 years through age 75, unless early forms of pre-cancerous polyps or small growths are found.  Hepatitis C blood testing is recommended for all people born from 1945 through 1965 and any individual with known risks for hepatitis C.  Practice  safe sex. Use condoms and avoid high-risk sexual practices to reduce the spread of sexually transmitted infections (STIs). STIs include gonorrhea, chlamydia, syphilis, trichomonas, herpes, HPV, and human immunodeficiency virus (HIV). Herpes, HIV, and HPV are viral illnesses that have no cure. They can result in disability, cancer, and death. Sexually active women aged 25 and younger should be checked for chlamydia. Older women with new or multiple partners should also be tested for chlamydia. Testing for other STIs is recommended if you are sexually active and at increased risk.  Osteoporosis is a disease in which the bones lose minerals and strength with aging. This can result in serious bone fractures. The risk of osteoporosis can be identified using a bone density scan. Women ages 65 and over and women at risk for fractures or osteoporosis should discuss screening with their caregivers. Ask your caregiver whether you should take a calcium supplement or vitamin D to reduce the rate of osteoporosis.  Menopause can be associated with physical symptoms and risks. Hormone replacement therapy is available to decrease symptoms and risks. You should talk to your caregiver about whether hormone replacement therapy is right for you.  Use sunscreen with sun protection factor (SPF) of 30 or more. Apply sunscreen liberally and repeatedly throughout the day. You should seek shade when your shadow is shorter than you. Protect yourself by wearing long sleeves, pants, a wide-brimmed hat, and sunglasses year round, whenever you are outdoors.  Once a month, do a whole body skin exam, using a mirror to look at the skin on your back. Notify your caregiver of new moles, moles that have irregular borders, moles that are larger than a pencil eraser, or moles that have changed in shape or color.  Stay current with required immunizations.  Influenza. You need a dose every fall (or winter). The composition of the flu vaccine  changes each year, so being vaccinated once is not enough.  Pneumococcal polysaccharide. You need 1 to 2 doses if you smoke cigarettes or if you have certain chronic medical conditions. You need 1 dose at age 65 (or older) if you have never been vaccinated.  Tetanus, diphtheria, pertussis (Tdap, Td). Get 1 dose of Tdap vaccine if you are younger than age 65, are over 65 and have contact with an infant, are a healthcare worker, are pregnant, or simply want to be protected from whooping cough. After that, you need a Td   booster dose every 10 years. Consult your caregiver if you have not had at least 3 tetanus and diphtheria-containing shots sometime in your life or have a deep or dirty wound.  HPV. You need this vaccine if you are a woman age 26 or younger. The vaccine is given in 3 doses over 6 months.  Measles, mumps, rubella (MMR). You need at least 1 dose of MMR if you were born in 1957 or later. You may also need a second dose.  Meningococcal. If you are age 19 to 21 and a first-year college student living in a residence hall, or have one of several medical conditions, you need to get vaccinated against meningococcal disease. You may also need additional booster doses.  Zoster (shingles). If you are age 60 or older, you should get this vaccine.  Varicella (chickenpox). If you have never had chickenpox or you were vaccinated but received only 1 dose, talk to your caregiver to find out if you need this vaccine.  Hepatitis A. You need this vaccine if you have a specific risk factor for hepatitis A virus infection or you simply wish to be protected from this disease. The vaccine is usually given as 2 doses, 6 to 18 months apart.  Hepatitis B. You need this vaccine if you have a specific risk factor for hepatitis B virus infection or you simply wish to be protected from this disease. The vaccine is given in 3 doses, usually over 6 months. Preventive Services / Frequency Ages 19 to 39  Blood  pressure check.** / Every 1 to 2 years.  Lipid and cholesterol check.** / Every 5 years beginning at age 20.  Clinical breast exam.** / Every 3 years for women in their 20s and 30s.  Pap test.** / Every 2 years from ages 21 through 29. Every 3 years starting at age 30 through age 65 or 70 with a history of 3 consecutive normal Pap tests.  HPV screening.** / Every 3 years from ages 30 through ages 65 to 70 with a history of 3 consecutive normal Pap tests.  Hepatitis C blood test.** / For any individual with known risks for hepatitis C.  Skin self-exam. / Monthly.  Influenza immunization.** / Every year.  Pneumococcal polysaccharide immunization.** / 1 to 2 doses if you smoke cigarettes or if you have certain chronic medical conditions.  Tetanus, diphtheria, pertussis (Tdap, Td) immunization. / A one-time dose of Tdap vaccine. After that, you need a Td booster dose every 10 years.  HPV immunization. / 3 doses over 6 months, if you are 26 and younger.  Measles, mumps, rubella (MMR) immunization. / You need at least 1 dose of MMR if you were born in 1957 or later. You may also need a second dose.  Meningococcal immunization. / 1 dose if you are age 19 to 21 and a first-year college student living in a residence hall, or have one of several medical conditions, you need to get vaccinated against meningococcal disease. You may also need additional booster doses.  Varicella immunization.** / Consult your caregiver.  Hepatitis A immunization.** / Consult your caregiver. 2 doses, 6 to 18 months apart.  Hepatitis B immunization.** / Consult your caregiver. 3 doses usually over 6 months. Ages 40 to 64  Blood pressure check.** / Every 1 to 2 years.  Lipid and cholesterol check.** / Every 5 years beginning at age 20.  Clinical breast exam.** / Every year after age 40.  Mammogram.** / Every year beginning at age 40   and continuing for as long as you are in good health. Consult with your  caregiver.  Pap test.** / Every 3 years starting at age 30 through age 65 or 70 with a history of 3 consecutive normal Pap tests.  HPV screening.** / Every 3 years from ages 30 through ages 65 to 70 with a history of 3 consecutive normal Pap tests.  Fecal occult blood test (FOBT) of stool. / Every year beginning at age 50 and continuing until age 75. You may not need to do this test if you get a colonoscopy every 10 years.  Flexible sigmoidoscopy or colonoscopy.** / Every 5 years for a flexible sigmoidoscopy or every 10 years for a colonoscopy beginning at age 50 and continuing until age 75.  Hepatitis C blood test.** / For all people born from 1945 through 1965 and any individual with known risks for hepatitis C.  Skin self-exam. / Monthly.  Influenza immunization.** / Every year.  Pneumococcal polysaccharide immunization.** / 1 to 2 doses if you smoke cigarettes or if you have certain chronic medical conditions.  Tetanus, diphtheria, pertussis (Tdap, Td) immunization.** / A one-time dose of Tdap vaccine. After that, you need a Td booster dose every 10 years.  Measles, mumps, rubella (MMR) immunization. / You need at least 1 dose of MMR if you were born in 1957 or later. You may also need a second dose.  Varicella immunization.** / Consult your caregiver.  Meningococcal immunization.** / Consult your caregiver.  Hepatitis A immunization.** / Consult your caregiver. 2 doses, 6 to 18 months apart.  Hepatitis B immunization.** / Consult your caregiver. 3 doses, usually over 6 months. Ages 65 and over  Blood pressure check.** / Every 1 to 2 years.  Lipid and cholesterol check.** / Every 5 years beginning at age 20.  Clinical breast exam.** / Every year after age 40.  Mammogram.** / Every year beginning at age 40 and continuing for as long as you are in good health. Consult with your caregiver.  Pap test.** / Every 3 years starting at age 30 through age 65 or 70 with a 3  consecutive normal Pap tests. Testing can be stopped between 65 and 70 with 3 consecutive normal Pap tests and no abnormal Pap or HPV tests in the past 10 years.  HPV screening.** / Every 3 years from ages 30 through ages 65 or 70 with a history of 3 consecutive normal Pap tests. Testing can be stopped between 65 and 70 with 3 consecutive normal Pap tests and no abnormal Pap or HPV tests in the past 10 years.  Fecal occult blood test (FOBT) of stool. / Every year beginning at age 50 and continuing until age 75. You may not need to do this test if you get a colonoscopy every 10 years.  Flexible sigmoidoscopy or colonoscopy.** / Every 5 years for a flexible sigmoidoscopy or every 10 years for a colonoscopy beginning at age 50 and continuing until age 75.  Hepatitis C blood test.** / For all people born from 1945 through 1965 and any individual with known risks for hepatitis C.  Osteoporosis screening.** / A one-time screening for women ages 65 and over and women at risk for fractures or osteoporosis.  Skin self-exam. / Monthly.  Influenza immunization.** / Every year.  Pneumococcal polysaccharide immunization.** / 1 dose at age 65 (or older) if you have never been vaccinated.  Tetanus, diphtheria, pertussis (Tdap, Td) immunization. / A one-time dose of Tdap vaccine if you are over   65 and have contact with an infant, are a healthcare worker, or simply want to be protected from whooping cough. After that, you need a Td booster dose every 10 years.  Varicella immunization.** / Consult your caregiver.  Meningococcal immunization.** / Consult your caregiver.  Hepatitis A immunization.** / Consult your caregiver. 2 doses, 6 to 18 months apart.  Hepatitis B immunization.** / Check with your caregiver. 3 doses, usually over 6 months. ** Family history and personal history of risk and conditions may change your caregiver's recommendations. Document Released: 10/27/2001 Document Revised: 11/23/2011  Document Reviewed: 01/26/2011 [employer] Patient Information 2014 [employer].  

## 2013-03-14 ENCOUNTER — Other Ambulatory Visit: Payer: Self-pay

## 2013-03-14 MED ORDER — ATORVASTATIN CALCIUM 20 MG PO TABS: 20.0000 mg | ORAL_TABLET | Freq: Every day | ORAL | Status: DC

## 2013-07-20 ENCOUNTER — Other Ambulatory Visit: Payer: Self-pay

## 2020-01-22 ENCOUNTER — Other Ambulatory Visit: Payer: Self-pay

## 2020-01-22 ENCOUNTER — Ambulatory Visit (INDEPENDENT_AMBULATORY_CARE_PROVIDER_SITE_OTHER): Payer: Managed Care, Other (non HMO) | Admitting: Surgical

## 2020-01-22 ENCOUNTER — Ambulatory Visit: Payer: Self-pay

## 2020-01-22 ENCOUNTER — Encounter: Payer: Self-pay | Admitting: Surgical

## 2020-01-22 VITALS — Ht 66.0 in | Wt 316.0 lb

## 2020-01-22 DIAGNOSIS — M1712 Unilateral primary osteoarthritis, left knee: Secondary | ICD-10-CM | POA: Diagnosis not present

## 2020-01-22 MED ORDER — MELOXICAM 15 MG PO TABS
15.0000 mg | ORAL_TABLET | Freq: Every day | ORAL | 0 refills | Status: DC
Start: 2020-01-22 — End: 2020-02-22

## 2020-01-22 MED ORDER — METHYLPREDNISOLONE ACETATE 40 MG/ML IJ SUSP
40.0000 mg | INTRAMUSCULAR | Status: AC | PRN
  Administered 2020-01-22: 40 mg via INTRA_ARTICULAR

## 2020-01-22 MED ORDER — BUPIVACAINE HCL 0.25 % IJ SOLN
4.0000 mL | INTRAMUSCULAR | Status: AC | PRN
  Administered 2020-01-22: 4 mL via INTRA_ARTICULAR

## 2020-01-22 MED ORDER — LIDOCAINE HCL 1 % IJ SOLN
5.0000 mL | INTRAMUSCULAR | Status: AC | PRN
  Administered 2020-01-22: 5 mL

## 2020-01-22 NOTE — Progress Notes (Addendum)
Office Visit Note   Patient: Penny Huerta           Date of Birth: 1965/10/23           MRN: [id number] Visit Date: 01/22/2020 Requested by: [address], [place], [address] [place],  [place] [address] PCP: Carollee Herter, Alferd Apa, DO  Subjective: Chief Complaint  Patient presents with  . Left Knee - Pain    HPI: Penny Huerta is a 54 y.o. female who presents to the office complaining of left knee pain.  Patient has had left knee pain for several months without acute incident or injury.  She complains of medial sided left knee pain.  Denies any groin pain, numbness/tingling, low back pain.  She does complain of her knee giving way on her on occasion but denies any mechanical symptoms.  Pain does wake her up at night.  She takes Advil as needed for pain.  She also has been doing straight leg raises which has helped with her pain to some degree.  She denies any history of knee surgery, knee injection, knee MRI.  She did have a brief bout of right Achilles tendinitis which caused her to favor her left leg and that is when this pain began.  She denies any history of diabetes..                ROS:  All systems reviewed are negative as they relate to the chief complaint within the history of present illness.  Patient denies fevers or chills.  Assessment & Plan: Visit Diagnoses:  1. Primary osteoarthritis of left knee     Plan: Patient is a 54 year old female presents complaint of left knee pain.  Pain is been ongoing for several months.  She complains of medial sided left knee pain without injury.  Radiographs taken today reveal left knee medial compartment narrowing without fracture or dislocation.  Impression is osteoarthritis of the left knee.  Discussed options available to patient.  Recommended nonloadbearing quad strengthening exercises.  Prescribed meloxicam for patient to take instead of Advil.  She has no history of heart attack/stroke, kidney disease, gastric bypass,  stomach ulcers.  Administered left knee injection today.  Patient tolerated the procedure well.  She will follow-up in 6 weeks for clinical recheck.  Follow-Up Instructions: No follow-ups on file.   Orders:  Orders Placed This Encounter  Procedures  . XR KNEE 3 VIEW LEFT   Meds ordered this encounter  Medications  . meloxicam (MOBIC) 15 MG tablet    Sig: Take 1 tablet (15 mg total) by mouth daily.    Dispense:  30 tablet    Refill:  0      Procedures: Large Joint Inj: L knee on 01/22/2020 2:47 PM Indications: diagnostic evaluation, joint swelling and pain Details: 18 G 1.5 in needle, superolateral approach  Arthrogram: No  Medications: 5 mL lidocaine 1 %; 40 mg methylPREDNISolone acetate 40 MG/ML; 4 mL bupivacaine 0.25 % Outcome: tolerated well, no immediate complications Procedure, treatment alternatives, risks and benefits explained, specific risks discussed. Consent was given by the patient. Immediately prior to procedure a time out was called to verify the correct patient, procedure, equipment, support staff and site/side marked as required. Patient was prepped and draped in the usual sterile fashion.       Clinical Data: No additional findings.  Objective: Vital Signs: Ht 5\' 6"  (1.676 m)   Wt (!) 316 lb (143.3 kg)  BMI 51.00 kg/m   Physical Exam:  Constitutional: Patient appears well-developed HEENT:  Head: Normocephalic Eyes:EOM are normal Neck: Normal range of motion Cardiovascular: Normal rate Pulmonary/chest: Effort normal Neurologic: Patient is alert Skin: Skin is warm Psychiatric: Patient has normal mood and affect  Ortho Exam:  Left knee Exam Tender to palpation over the medial joint line as well as the pes anserine bursa No effusion Extensor mechanism intact No TTP over the lateral jointlines, quad tendon, patellar tendon, patella, tibial tubercle, LCL/MCL insertions Stable to varus/valgus stresses.  Stable to anterior/posterior  drawer Extension to 0 degrees Flexion > 90 degrees  Specialty Comments:  No specialty comments available.  Imaging: No results found.   PMFS History: Patient Active Problem List   Diagnosis Date Noted  . Anxiety 02/17/2011  . Seasonal allergies 02/17/2011   Past Medical History:  Diagnosis Date  . Allergy   . Anxiety   . Heart murmur    ARRHYTHMIA  . Hyperlipidemia     Family History  Problem Relation Age of Onset  . Hyperlipidemia Mother   . Transient ischemic attack Mother   . Transient ischemic attack Father   . Hypertension Mother   . Diabetes Mother   . Ovarian cancer Paternal Grandmother   . Hyperlipidemia Maternal Grandfather   . Hyperlipidemia Maternal Grandmother   . Heart disease Maternal Grandfather   . Heart disease Maternal Grandmother   . Stroke Paternal Grandfather   . Hypertension Paternal Grandfather   . Diabetes Paternal Grandfather   . Sudden death Maternal Uncle     Past Surgical History:  Procedure Laterality Date  . HYSTEROSCOPY W/ ENDOMETRIAL ABLATION  2011   Social History   Occupational History  . Occupation: Best boy  Tobacco Use  . Smoking status: Former Smoker    Years: 20.00    Types: Cigarettes    Quit date: 09/14/1997    Years since quitting: 22.3  . Smokeless tobacco: Never Used  Substance and Sexual Activity  . Alcohol use: No  . Drug use: No  . Sexual activity: Yes    Partners: Male

## 2020-02-19 ENCOUNTER — Other Ambulatory Visit: Payer: Self-pay

## 2020-02-19 ENCOUNTER — Encounter: Payer: Self-pay | Admitting: Orthopedic Surgery

## 2020-02-19 ENCOUNTER — Ambulatory Visit (INDEPENDENT_AMBULATORY_CARE_PROVIDER_SITE_OTHER): Payer: Managed Care, Other (non HMO) | Admitting: Orthopedic Surgery

## 2020-02-19 VITALS — Ht 66.0 in | Wt 316.0 lb

## 2020-02-19 DIAGNOSIS — M1712 Unilateral primary osteoarthritis, left knee: Secondary | ICD-10-CM

## 2020-02-22 ENCOUNTER — Encounter: Payer: Self-pay | Admitting: Orthopedic Surgery

## 2020-02-22 MED ORDER — MELOXICAM 15 MG PO TABS: 15.0000 mg | ORAL_TABLET | Freq: Every day | ORAL | 0 refills | Status: DC

## 2020-02-22 NOTE — Progress Notes (Signed)
Office Visit Note   Patient: Penny Huerta           Date of Birth: Nov 22, 1965           MRN: [id number] Visit Date: 02/19/2020 Requested by: [address] [place], [place] [address] [place],   [id number] PCP: Carollee Herter, Alferd Apa, DO  Subjective: Chief Complaint  Patient presents with  . Left Knee - Pain, Follow-up    HPI: Alizia is a 54 year old patient with left knee pain.  Had injection 01/22/2020.  The injection helped and her knee is much better.  Does not hurt like it did before.  She has been taking Mobic on a daily basis.  That is refilled today.  Hurts her to walk at times.  Denies much in the way of mechanical symptoms.              ROS: All systems reviewed are negative as they relate to the chief complaint within the history of present illness.  Patient denies  fevers or chills.   Assessment & Plan: Visit Diagnoses: No diagnosis found.  Plan: Impression is left knee pain with improvement following injection.  Mobic refilled today.  Nonweightbearing quad strengthening exercises encouraged.  We will see her back as needed.  Follow-Up Instructions: Return if symptoms worsen or fail to improve.   Orders:  No orders of the defined types were placed in this encounter.  Meds ordered this encounter  Medications  . meloxicam (MOBIC) 15 MG tablet    Sig: Take 1 tablet (15 mg total) by mouth daily.    Dispense:  30 tablet    Refill:  0      Procedures: No procedures performed   Clinical Data: No additional findings.  Objective: Vital Signs: Ht 5\' 6"  (1.676 m)   Wt (!) 316 lb (143.3 kg)   BMI 51.00 kg/m   Physical Exam:   Constitutional: Patient appears well-developed HEENT:  Head: Normocephalic Eyes:EOM are normal Neck: Normal range of motion Cardiovascular: Normal rate Pulmonary/chest: Effort normal Neurologic: Patient is alert Skin: Skin is warm Psychiatric: Patient has normal mood and affect    Ortho Exam: Ortho exam  demonstrates no left knee effusion.  Extensor mechanism is intact.  Collateral and cruciate ligaments are stable.  Patient has little bit more medial than lateral joint line tenderness.  No masses lymphadenopathy or skin changes noted in that left knee region.  Gait is normal.  Specialty Comments:  No specialty comments available.  Imaging: No results found.   PMFS History: Patient Active Problem List   Diagnosis Date Noted  . Anxiety 02/17/2011  . Seasonal allergies 02/17/2011   Past Medical History:  Diagnosis Date  . Allergy   . Anxiety   . Heart murmur    ARRHYTHMIA  . Hyperlipidemia     Family History  Problem Relation Age of Onset  . Hyperlipidemia Mother   . Transient ischemic attack Mother   . Transient ischemic attack Father   . Hypertension Mother   . Diabetes Mother   . Ovarian cancer Paternal Grandmother   . Hyperlipidemia Maternal Grandfather   . Hyperlipidemia Maternal Grandmother   . Heart disease Maternal Grandfather   . Heart disease Maternal Grandmother   . Stroke Paternal Grandfather   . Hypertension Paternal Grandfather   . Diabetes Paternal Grandfather   . Sudden death Maternal Uncle     Past Surgical History:  Procedure Laterality Date  . HYSTEROSCOPY W/ ENDOMETRIAL ABLATION  2011   Social History   Occupational History  . Occupation: Best boy  Tobacco Use  . Smoking status: Former Smoker    Years: 20.00    Types: Cigarettes    Quit date: 09/14/1997    Years since quitting: 22.4  . Smokeless tobacco: Never Used  Substance and Sexual Activity  . Alcohol use: No  . Drug use: No  . Sexual activity: Yes    Partners: Male

## 2020-07-23 ENCOUNTER — Other Ambulatory Visit: Payer: Self-pay

## 2020-07-23 ENCOUNTER — Other Ambulatory Visit (HOSPITAL_COMMUNITY): Payer: Self-pay

## 2020-07-23 ENCOUNTER — Emergency Department (HOSPITAL_COMMUNITY)
Admission: EM | Admit: 2020-07-23 | Discharge: 2020-07-24 | Disposition: A | Discharge status: Home / Self Care | Payer: 59 | Source: Home / Self Care | Attending: Emergency Medicine | Admitting: Emergency Medicine

## 2020-07-23 ENCOUNTER — Encounter (HOSPITAL_COMMUNITY): Payer: Self-pay

## 2020-07-23 DIAGNOSIS — Z87891 Personal history of nicotine dependence: Secondary | ICD-10-CM | POA: Insufficient documentation

## 2020-07-23 DIAGNOSIS — R0902 Hypoxemia: Secondary | ICD-10-CM | POA: Diagnosis not present

## 2020-07-23 DIAGNOSIS — U071 COVID-19: Secondary | ICD-10-CM

## 2020-07-23 NOTE — ED Triage Notes (Signed)
Pt dx with COVID pneumonia.   Onset 3 days ago cough, runny nose.  Onset today pt started losing taste and smell.  Was given antibiotic and steroids today.  Someone from an infusion clinic was to call patient to up time to give infusion but no one every called.  Pt is concerned that she is very weak and more short of breath when walking to [place]. Talking in complete sentences.  NAD.  Voice sounds hoarse.

## 2020-07-24 ENCOUNTER — Other Ambulatory Visit (HOSPITAL_COMMUNITY): Payer: Self-pay | Admitting: Family

## 2020-07-24 ENCOUNTER — Encounter (HOSPITAL_COMMUNITY): Payer: Self-pay | Admitting: Student

## 2020-07-24 ENCOUNTER — Telehealth (HOSPITAL_COMMUNITY): Payer: Self-pay

## 2020-07-24 ENCOUNTER — Ambulatory Visit (HOSPITAL_COMMUNITY)
Admission: RE | Admit: 2020-07-24 | Discharge: 2020-07-24 | Disposition: A | Discharge status: Home / Self Care | Payer: 59 | Source: Ambulatory Visit | Attending: Pulmonary Disease | Admitting: Pulmonary Disease

## 2020-07-24 DIAGNOSIS — U071 COVID-19: Secondary | ICD-10-CM | POA: Insufficient documentation

## 2020-07-24 MED ORDER — ALBUTEROL SULFATE HFA 108 (90 BASE) MCG/ACT IN AERS
2.0000 | INHALATION_SPRAY | Freq: Once | RESPIRATORY_TRACT | Status: AC
  Administered 2020-07-24: 2 via RESPIRATORY_TRACT
  Filled 2020-07-24: qty 6.7

## 2020-07-24 MED ORDER — DIPHENHYDRAMINE HCL 50 MG/ML IJ SOLN: 50.0000 mg | Freq: Once | INTRAMUSCULAR | Status: DC | PRN

## 2020-07-24 MED ORDER — SODIUM CHLORIDE 0.9 % IV SOLN: INTRAVENOUS | Status: DC | PRN

## 2020-07-24 MED ORDER — METHYLPREDNISOLONE SODIUM SUCC 125 MG IJ SOLR: 125.0000 mg | Freq: Once | INTRAMUSCULAR | Status: DC | PRN

## 2020-07-24 MED ORDER — FAMOTIDINE IN NACL 20-0.9 MG/50ML-% IV SOLN: 20.0000 mg | Freq: Once | INTRAVENOUS | Status: DC | PRN

## 2020-07-24 MED ORDER — SOTROVIMAB 500 MG/8ML IV SOLN
500.0000 mg | Freq: Once | INTRAVENOUS | Status: AC
  Administered 2020-07-24: 500 mg via INTRAVENOUS

## 2020-07-24 MED ORDER — AEROCHAMBER PLUS FLO-VU LARGE MISC
1.0000 | Freq: Once | Status: AC
  Administered 2020-07-24: 1

## 2020-07-24 MED ORDER — BENZONATATE 100 MG PO CAPS: 100.0000 mg | ORAL_CAPSULE | Freq: Three times a day (TID) | ORAL | 0 refills | Status: DC

## 2020-07-24 MED ORDER — EPINEPHRINE 0.3 MG/0.3ML IJ SOAJ: 0.3000 mg | Freq: Once | INTRAMUSCULAR | Status: DC | PRN

## 2020-07-24 MED ORDER — ALBUTEROL SULFATE HFA 108 (90 BASE) MCG/ACT IN AERS: 2.0000 | INHALATION_SPRAY | Freq: Once | RESPIRATORY_TRACT | Status: DC | PRN

## 2020-07-24 NOTE — Progress Notes (Signed)
Diagnosis: COVID-19  Physician: Dr. Wright  Procedure: Covid Infusion Clinic Med: Sotrovimab infusion - Provided patient with Sotrovimab fact sheet for patients, parents and caregivers prior to infusion.  Complications: No immediate complications noted.  Discharge: Discharged home   Penny Huerta 07/24/2020 

## 2020-07-24 NOTE — Discharge Instructions (Signed)

## 2020-07-24 NOTE — Progress Notes (Signed)
I connected by phone with Penny Huerta on 07/24/2020 at 11:25 AM to discuss the potential use of a new treatment for mild to moderate COVID-19 viral infection in non-hospitalized patients.  This patient is a 54 y.o. female that meets the FDA criteria for Emergency Use Authorization of COVID monoclonal antibody casirivimab/imdevimab, bamlanivimab/eteseviamb, or sotrovimab.  Has a (+) direct SARS-CoV-2 viral test result  Has mild or moderate COVID-19   Is NOT hospitalized due to COVID-19  Is within 10 days of symptom onset  Has at least one of the high risk factor(s) for progression to severe COVID-19 and/or hospitalization as defined in EUA.  Specific high risk criteria : BMI > 25   Symptoms fever, chills, fatigue, cough, SOB began 07/21/20.   I have spoken and communicated the following to the patient or parent/caregiver regarding COVID monoclonal antibody treatment:  1. FDA has authorized the emergency use for the treatment of mild to moderate COVID-19 in adults and pediatric patients with positive results of direct SARS-CoV-2 viral testing who are 29 years of age and older weighing at least 40 kg, and who are at high risk for progressing to severe COVID-19 and/or hospitalization.  2. The significant known and potential risks and benefits of COVID monoclonal antibody, and the extent to which such potential risks and benefits are unknown.  3. Information on available alternative treatments and the risks and benefits of those alternatives, including clinical trials.  4. Patients treated with COVID monoclonal antibody should continue to self-isolate and use infection control measures (e.g., wear mask, isolate, social distance, avoid sharing personal items, clean and disinfect "high touch" surfaces, and frequent handwashing) according to CDC guidelines.   5. The patient or parent/caregiver has the option to accept or refuse COVID monoclonal antibody treatment.  After reviewing this  information with the patient, the patient has agreed to receive one of the available covid 19 monoclonal antibodies and will be provided an appropriate fact sheet prior to infusion. Morton Stall, NP 07/24/2020 11:25 AM

## 2020-07-24 NOTE — Telephone Encounter (Signed)
Called to Discuss with patient about Covid symptoms and the use of the monoclonal antibody infusion for those with mild to moderate Covid symptoms and at a high risk of hospitalization.     Pt appears to qualify for this infusion due to co-morbid conditions and/or a member of an at-risk group in accordance with the FDA Emergency Use Authorization.    Risk factors include: BMI >25, former smoker  Pt with sx onset 07/19/20. Positive test 07/23/20 at [facility].  Pre-screened by RN and ready for APP to call and further discuss and/or schedule appt for monoclonal antibody infusion.

## 2020-07-24 NOTE — ED Provider Notes (Addendum)
[facility] EMERGENCY DEPARTMENT Provider Note   CSN: [id number] Arrival date & time: 07/23/20  2026     History Chief Complaint  Patient presents with   Covid Positive    Penny Huerta is a 54 y.o. female with a history anxiety, hyperlipidemia, & seasonal allergies who presents to the ED with concern regarding her covid 19 diagnosis. Patient states that she has felt sick for 3 days now with fever, chills, fatigue, nasal congestion, dry cough, dyspnea, & mild diarrhea.  Had a positive COVID-19 test, chest x-ray that showed Covid pneumonia, and a telehealth visit where she was prescribed azithromycin and steroids.  She states that she is very nervous about her diagnosis, she states that at home she checked her SPO2 without home pulse oximeter and it was 91% which prompted her ER visit.  No alleviating or aggravating factors to her symptoms.  She denies chest pain, abdominal pain, nausea, vomiting, syncope, leg pain/swelling, hemoptysis, recent surgery/trauma, recent long travel, hormone use, personal hx of cancer, or hx of DVT/PE.    HPI     Past Medical History:  Diagnosis Date   Allergy    Anxiety    Heart murmur    ARRHYTHMIA   Hyperlipidemia     Patient Active Problem List   Diagnosis Date Noted   Anxiety 02/17/2011   Seasonal allergies 02/17/2011    Past Surgical History:  Procedure Laterality Date   HYSTEROSCOPY W/ ENDOMETRIAL ABLATION  2011     OB History   No obstetric history on file.     Family History  Problem Relation Age of Onset   Hyperlipidemia Mother    Transient ischemic attack Mother    Transient ischemic attack Father    Hypertension Mother    Diabetes Mother    Ovarian cancer Paternal Grandmother    Hyperlipidemia Maternal Grandfather    Hyperlipidemia Maternal Grandmother    Heart disease Maternal Grandfather    Heart disease Maternal Grandmother    Stroke Paternal Grandfather    Hypertension Paternal  Grandfather    Diabetes Paternal Grandfather    Sudden death Maternal Uncle     Social History   Tobacco Use   Smoking status: Former Smoker    Years: 20.00    Types: Cigarettes    Quit date: 09/14/1997    Years since quitting: 22.8   Smokeless tobacco: Never Used  Substance Use Topics   Alcohol use: No   Drug use: No    Home Medications Prior to Admission medications   Medication Sig Start Date End Date Taking? Authorizing Provider  atorvastatin (LIPITOR) 20 MG tablet Take 1 tablet (20 mg total) by mouth daily. 03/14/13   Donato Schultz, DO  fluticasone (FLONASE) 50 MCG/ACT nasal spray 2 sprays each nostril qd 03/10/13   Zola Button, Grayling Congress, DO  ibuprofen (ADVIL,MOTRIN) 200 MG tablet Take 800 mg by mouth every 6 (six) hours as needed.    [provider]  meloxicam (MOBIC) 15 MG tablet Take 1 tablet (15 mg total) by mouth daily. 02/22/20   Cammy Copa, MD  Pseudoephedrine-Ibuprofen (ADVIL COLD/SINUS PO) Take 1 tablet by mouth as needed.      [provider]    Allergies    Caffeine, Alprazolam, Citalopram hydrobromide, Nitrofurantoin monohyd macro, Statins, and Sulfa drugs cross reactors  Review of Systems   Review of Systems  Constitutional: Positive for chills, fatigue and fever.  HENT: Positive for congestion.   Respiratory: Positive for  cough and shortness of breath.   Cardiovascular: Negative for chest pain and leg swelling.  Gastrointestinal: Positive for diarrhea. Negative for abdominal pain, blood in stool, nausea and vomiting.  Genitourinary: Negative for dysuria.  Neurological: Negative for syncope.  All other systems reviewed and are negative.   Physical Exam Updated Vital Signs BP (!) 146/83 (BP Location: Right Arm)    Pulse 98    Temp 99 F (37.2 C) (Oral)    Resp 20    LMP 01/06/2012    SpO2 95%   Physical Exam Vitals and nursing note reviewed.  Constitutional:      General: She is not in acute distress.     Appearance: She is well-developed.  HENT:     Head: Normocephalic and atraumatic.     Right Ear: Ear canal normal. Tympanic membrane is not perforated, erythematous, retracted or bulging.     Left Ear: Ear canal normal. Tympanic membrane is not perforated, erythematous, retracted or bulging.     Ears:     Comments: No mastoid erythema/swelling/tenderness.     Nose:     Right Sinus: No maxillary sinus tenderness or frontal sinus tenderness.     Left Sinus: No maxillary sinus tenderness or frontal sinus tenderness.     Mouth/Throat:     Pharynx: Uvula midline. No oropharyngeal exudate or posterior oropharyngeal erythema.     Comments: Posterior oropharynx is symmetric appearing. Patient tolerating own secretions without difficulty. No trismus. No drooling. No hot potato voice. No swelling beneath the tongue, submandibular compartment is soft.  Eyes:     General:        Right eye: No discharge.        Left eye: No discharge.     Conjunctiva/sclera: Conjunctivae normal.     Pupils: Pupils are equal, round, and reactive to light.  Cardiovascular:     Rate and Rhythm: Normal rate and regular rhythm.     Heart sounds: No murmur heard.   Pulmonary:     Effort: Pulmonary effort is normal. No respiratory distress.     Breath sounds: Normal breath sounds. No wheezing, rhonchi or rales.  Abdominal:     General: There is no distension.     Palpations: Abdomen is soft.     Tenderness: There is no abdominal tenderness. There is no guarding or rebound.  Musculoskeletal:     Cervical back: Normal range of motion and neck supple. No edema or rigidity.  Lymphadenopathy:     Cervical: No cervical adenopathy.  Skin:    General: Skin is warm and dry.     Findings: No rash.  Neurological:     Mental Status: She is alert.  Psychiatric:        Behavior: Behavior normal.    ED Results / Procedures / Treatments   Labs (all labs ordered are listed, but only abnormal results are displayed) Labs  Reviewed - No data to display  EKG EKG Interpretation  Date/Time:  Tuesday July 23 2020 20:35:45 EST Ventricular Rate:  112 PR Interval:    QRS Duration: 84 QT Interval:  334 QTC Calculation: 455 R Axis:   -15 Text Interpretation: Sinus tachycardia Anterior infarct , age undetermined Abnormal ECG No old tracing to compare Confirmed by Dione Booze ([id number]) on 07/24/2020 12:14:43 AM   Radiology No results found.  Procedures Procedures (including critical care time)  Medications Ordered in ED Medications  albuterol (VENTOLIN HFA) 108 (90 Base) MCG/ACT inhaler 2 puff (has no administration in time  range)  AeroChamber Plus Flo-Vu Medium MISC 1 each (has no administration in time range)    ED Course  I have reviewed the triage vital signs and the nursing notes.  Pertinent labs & imaging results that were available during my care of the patient were reviewed by me and considered in my medical decision making (see chart for details).    Karstyn Birkey Milam was evaluated in Emergency Department on 07/24/2020 for the symptoms described in the history of present illness. He/she was evaluated in the context of the global COVID-19 pandemic, which necessitated consideration that the patient might be at risk for infection with the SARS-CoV-2 virus that causes COVID-19. Institutional protocols and algorithms that pertain to the evaluation of patients at risk for COVID-19 are in a state of rapid change based on information released by regulatory bodies including the CDC and federal and state organizations. These policies and algorithms were followed during the patient's care in the ED.  MDM Rules/Calculators/A&P                          Patient presents to the emergency department with concern for low SPO2 at home with known COVID-19.  She is nontoxic, resting comfortably, her vitals are without significant abnormality.  Her SPO2 is 94 to 97% on room air at rest, I personally had the patient  ambulate back and forth throughout the exam room at all times and she maintained SPO2 greater than 92% on room air.  She does not appear to be in respiratory distress.  Her lungs are clear.  Given she had a chest x-ray within the past 24 hours do not feel this needs repeating, her lungs are clear currently, given she has only had 3 days of symptoms I have a very low suspicion that she has a superimposed bacterial pneumonia, we discussed that she can likely discontinue azithromycin as well as the prednisone previously prescribed to her.  She is low risk Wells, doubt pulmonary embolism.  She has no chest pain, EKG does not show a STEMI.  She overall does not appear to require admission to the hospital for COVID-19 at this time.  She appears appropriate for discharge home with symptomatic care.  I have messaged our infusion clinic to help facilitate close follow-up. I discussed results, treatment plan, need for follow-up, and return precautions with the patient. Provided opportunity for questions, patient confirmed understanding and is in agreement with plan.    Final Clinical Impression(s) / ED Diagnoses Final diagnoses:  COVID-19    Rx / DC Orders ED Discharge Orders         Ordered    benzonatate (TESSALON) 100 MG capsule  Every 8 hours        07/24/20 0134           Cherly Anderson, PA-C 07/24/20 0136    Shon Baton, MD 07/24/20 0259    Cherly Anderson, PA-C 07/24/20 4010    Shon Baton, MD 07/24/20 [phone]

## 2020-07-24 NOTE — Discharge Instructions (Signed)
You are seen in the emergency department today for shortness of breath.  Your pulse oximeter was reassuring with SPO2 in the mid 90s. We are sending you home with inhaler to use 1 to 2 puffs every 4-6 hours as needed for shortness of breath, wheezing, or chest tightness as well as Tessalon to take every 8 hours as needed for coughing.  We have prescribed you new medication(s) today. Discuss the medications prescribed today with your pharmacist as they can have adverse effects and interactions with your other medicines including over the counter and prescribed medications. Seek medical evaluation if you start to experience new or abnormal symptoms after taking one of these medicines, seek care immediately if you start to experience difficulty breathing, feeling of your throat closing, facial swelling, or rash as these could be indications of a more serious allergic reaction  We are instructing patient's with COVID 19 or symptoms of COVID 19 to isolate themselves for 14 days. You may be able to discontinue self isolation if the following conditions are met:   Persons with COVID-19 who have symptoms and were directed to care for themselves at home may discontinue home isolation under the  following conditions: - It has been at least 7 days have passed since symptoms first appeared. - AND at least 3 days (72 hours) have passed since recovery defined as resolution of fever without the use of fever-reducing medications and improvement in respiratory symptoms (e.g., cough, shortness of breath)  Please follow the below instructions.   We have messaged the monoclonal antibody infusion center to help facilitate follow-up.  Please follow up with primary care within 3-5 days for re-evaluation- call prior to going to the office to make them aware of your symptoms as some offices are altering their method of seeing patients with COVID 19 symptoms, we have also provided our [place] covid clinic for follow up as well.   Return to the ER for new or worsening symptoms including but not limited to increased work of breathing, chest pain, passing out, or any other concerns.       Person Under Monitoring Name: Penny Huerta  Location: [address] [place] [address]   Infection Prevention Recommendations for Individuals Confirmed to have, or Being Evaluated for, 2019 Novel Coronavirus (COVID-19) Infection Who Receive Care at Home  Individuals who are confirmed to have, or are being evaluated for, COVID-19 should follow the prevention steps below until a healthcare provider or local or state health department says they can return to normal activities.  Stay home except to get medical care You should restrict activities outside your home, except for getting medical care. Do not go to work, school, or public areas, and do not use public transportation or taxis.  Call ahead before visiting your doctor Before your medical appointment, call the healthcare provider and tell them that you have, or are being evaluated for, COVID-19 infection. This will help the healthcare providers office take steps to keep other people from getting infected. Ask your healthcare provider to call the local or state health department.  Monitor your symptoms Seek prompt medical attention if your illness is worsening (e.g., difficulty breathing). Before going to your medical appointment, call the healthcare provider and tell them that you have, or are being evaluated for, COVID-19 infection. Ask your healthcare provider to call the local or state health department.  Wear a facemask You should wear a facemask that covers your nose and mouth when you are in  the same room with other people and when you visit a healthcare provider. People who live with or visit you should also wear a facemask while they are in the same room with you.  Separate yourself from other people in your home As much as possible, you  should stay in a different room from other people in your home. Also, you should use a separate bathroom, if available.  Avoid sharing household items You should not share dishes, drinking glasses, cups, eating utensils, towels, bedding, or other items with other people in your home. After using these items, you should wash them thoroughly with soap and water.  Cover your coughs and sneezes Cover your mouth and nose with a tissue when you cough or sneeze, or you can cough or sneeze into your sleeve. Throw used tissues in a lined trash can, and immediately wash your hands with soap and water for at least 20 seconds or use an alcohol-based hand rub.  Wash your [facility] your hands often and thoroughly with soap and water for at least 20 seconds. You can use an alcohol-based hand sanitizer if soap and water are not available and if your hands are not visibly dirty. Avoid touching your eyes, nose, and mouth with unwashed hands.   Prevention Steps for Caregivers and Household Members of Individuals Confirmed to have, or Being Evaluated for, COVID-19 Infection Being Cared for in the Home  If you live with, or provide care at home for, a person confirmed to have, or being evaluated for, COVID-19 infection please follow these guidelines to prevent infection:  Follow healthcare providers instructions Make sure that you understand and can help the patient follow any healthcare provider instructions for all care.  Provide for the patients basic needs You should help the patient with basic needs in the home and provide support for getting groceries, prescriptions, and other personal needs.  Monitor the patients symptoms If they are getting sicker, call his or her medical provider and tell them that the patient has, or is being evaluated for, COVID-19 infection. This will help the healthcare providers office take steps to keep other people from getting infected. Ask the healthcare provider  to call the local or state health department.  Limit the number of people who have contact with the patient If possible, have only one caregiver for the patient. Other household members should stay in another home or place of residence. If this is not possible, they should stay in another room, or be separated from the patient as much as possible. Use a separate bathroom, if available. Restrict visitors who do not have an essential need to be in the home.  Keep older adults, very young children, and other sick people away from the patient Keep older adults, very young children, and those who have compromised immune systems or chronic health conditions away from the patient. This includes people with chronic heart, lung, or kidney conditions, diabetes, and cancer.  Ensure good ventilation Make sure that shared spaces in the home have good air flow, such as from an air conditioner or an opened window, weather permitting.  Wash your hands often Wash your hands often and thoroughly with soap and water for at least 20 seconds. You can use an alcohol based hand sanitizer if soap and water are not available and if your hands are not visibly dirty. Avoid touching your eyes, nose, and mouth with unwashed hands. Use disposable paper towels to dry your hands. If not available, use dedicated cloth  towels and replace them when they become wet.  Wear a facemask and gloves Wear a disposable facemask at all times in the room and gloves when you touch or have contact with the patients blood, body fluids, and/or secretions or excretions, such as sweat, saliva, sputum, nasal mucus, vomit, urine, or feces.  Ensure the mask fits over your nose and mouth tightly, and do not touch it during use. Throw out disposable facemasks and gloves after using them. Do not reuse. Wash your hands immediately after removing your facemask and gloves. If your personal clothing becomes contaminated, carefully remove clothing and  launder. Wash your hands after handling contaminated clothing. Place all used disposable facemasks, gloves, and other waste in a lined container before disposing them with other household waste. Remove gloves and wash your hands immediately after handling these items.  Do not share dishes, glasses, or other household items with the patient Avoid sharing household items. You should not share dishes, drinking glasses, cups, eating utensils, towels, bedding, or other items with a patient who is confirmed to have, or being evaluated for, COVID-19 infection. After the person uses these items, you should wash them thoroughly with soap and water.  Wash laundry thoroughly Immediately remove and wash clothes or bedding that have blood, body fluids, and/or secretions or excretions, such as sweat, saliva, sputum, nasal mucus, vomit, urine, or feces, on them. Wear gloves when handling laundry from the patient. Read and follow directions on labels of laundry or clothing items and detergent. In general, wash and dry with the warmest temperatures recommended on the label.  Clean all areas the individual has used often Clean all touchable surfaces, such as counters, tabletops, doorknobs, bathroom fixtures, toilets, phones, keyboards, tablets, and bedside tables, every day. Also, clean any surfaces that may have blood, body fluids, and/or secretions or excretions on them. Wear gloves when cleaning surfaces the patient has come in contact with. Use a diluted bleach solution (e.g., dilute bleach with 1 part bleach and 10 parts water) or a household disinfectant with a label that says EPA-registered for coronaviruses. To make a bleach solution at home, add 1 tablespoon of bleach to 1 quart (4 cups) of water. For a larger supply, add  cup of bleach to 1 gallon (16 cups) of water. Read labels of cleaning products and follow recommendations provided on product labels. Labels contain instructions for safe and effective  use of the cleaning product including precautions you should take when applying the product, such as wearing gloves or eye protection and making sure you have good ventilation during use of the product. Remove gloves and wash hands immediately after cleaning.  Monitor yourself for signs and symptoms of illness Caregivers and household members are considered close contacts, should monitor their health, and will be asked to limit movement outside of the home to the extent possible. Follow the monitoring steps for close contacts listed on the symptom monitoring form.   ? If you have additional questions, contact your local health department or call the epidemiologist on call at [phone] (available 24/7). ? This guidance is subject to change. For the most up-to-date guidance from [facility], please refer to their website: [email or web address]

## 2020-07-26 ENCOUNTER — Other Ambulatory Visit: Payer: Self-pay

## 2020-07-26 ENCOUNTER — Inpatient Hospital Stay (HOSPITAL_COMMUNITY)
Admission: EM | Admit: 2020-07-26 | Discharge: 2020-08-12 | DRG: 177 | Disposition: A | Discharge status: Home Health | Payer: 59 | Attending: Internal Medicine | Admitting: Internal Medicine

## 2020-07-26 ENCOUNTER — Encounter (HOSPITAL_COMMUNITY): Payer: Self-pay

## 2020-07-26 ENCOUNTER — Emergency Department (HOSPITAL_COMMUNITY): Payer: 59

## 2020-07-26 DIAGNOSIS — I951 Orthostatic hypotension: Secondary | ICD-10-CM | POA: Diagnosis not present

## 2020-07-26 DIAGNOSIS — D62 Acute posthemorrhagic anemia: Secondary | ICD-10-CM | POA: Diagnosis not present

## 2020-07-26 DIAGNOSIS — M7981 Nontraumatic hematoma of soft tissue: Secondary | ICD-10-CM | POA: Diagnosis not present

## 2020-07-26 DIAGNOSIS — Z79899 Other long term (current) drug therapy: Secondary | ICD-10-CM | POA: Diagnosis not present

## 2020-07-26 DIAGNOSIS — E878 Other disorders of electrolyte and fluid balance, not elsewhere classified: Secondary | ICD-10-CM | POA: Diagnosis not present

## 2020-07-26 DIAGNOSIS — R739 Hyperglycemia, unspecified: Secondary | ICD-10-CM

## 2020-07-26 DIAGNOSIS — E785 Hyperlipidemia, unspecified: Secondary | ICD-10-CM | POA: Diagnosis present

## 2020-07-26 DIAGNOSIS — Z791 Long term (current) use of non-steroidal anti-inflammatories (NSAID): Secondary | ICD-10-CM | POA: Diagnosis not present

## 2020-07-26 DIAGNOSIS — E860 Dehydration: Secondary | ICD-10-CM | POA: Diagnosis not present

## 2020-07-26 DIAGNOSIS — E871 Hypo-osmolality and hyponatremia: Secondary | ICD-10-CM | POA: Diagnosis not present

## 2020-07-26 DIAGNOSIS — I480 Paroxysmal atrial fibrillation: Secondary | ICD-10-CM | POA: Diagnosis present

## 2020-07-26 DIAGNOSIS — K760 Fatty (change of) liver, not elsewhere classified: Secondary | ICD-10-CM | POA: Diagnosis present

## 2020-07-26 DIAGNOSIS — E1165 Type 2 diabetes mellitus with hyperglycemia: Secondary | ICD-10-CM | POA: Diagnosis present

## 2020-07-26 DIAGNOSIS — T380X5A Adverse effect of glucocorticoids and synthetic analogues, initial encounter: Secondary | ICD-10-CM | POA: Diagnosis present

## 2020-07-26 DIAGNOSIS — Z87891 Personal history of nicotine dependence: Secondary | ICD-10-CM

## 2020-07-26 DIAGNOSIS — Z794 Long term (current) use of insulin: Secondary | ICD-10-CM

## 2020-07-26 DIAGNOSIS — E876 Hypokalemia: Secondary | ICD-10-CM | POA: Diagnosis not present

## 2020-07-26 DIAGNOSIS — R0902 Hypoxemia: Secondary | ICD-10-CM

## 2020-07-26 DIAGNOSIS — R1011 Right upper quadrant pain: Secondary | ICD-10-CM | POA: Diagnosis not present

## 2020-07-26 DIAGNOSIS — I5043 Acute on chronic combined systolic (congestive) and diastolic (congestive) heart failure: Secondary | ICD-10-CM | POA: Diagnosis not present

## 2020-07-26 DIAGNOSIS — K5900 Constipation, unspecified: Secondary | ICD-10-CM | POA: Diagnosis not present

## 2020-07-26 DIAGNOSIS — I4891 Unspecified atrial fibrillation: Secondary | ICD-10-CM | POA: Diagnosis not present

## 2020-07-26 DIAGNOSIS — I5023 Acute on chronic systolic (congestive) heart failure: Secondary | ICD-10-CM | POA: Diagnosis not present

## 2020-07-26 DIAGNOSIS — U071 COVID-19: Secondary | ICD-10-CM | POA: Diagnosis present

## 2020-07-26 DIAGNOSIS — Z6841 Body Mass Index (BMI) 40.0 and over, adult: Secondary | ICD-10-CM

## 2020-07-26 DIAGNOSIS — K8020 Calculus of gallbladder without cholecystitis without obstruction: Secondary | ICD-10-CM | POA: Diagnosis present

## 2020-07-26 DIAGNOSIS — J9601 Acute respiratory failure with hypoxia: Secondary | ICD-10-CM | POA: Diagnosis present

## 2020-07-26 DIAGNOSIS — J1282 Pneumonia due to coronavirus disease 2019: Secondary | ICD-10-CM | POA: Diagnosis present

## 2020-07-26 HISTORY — DX: Unspecified atrial fibrillation: I48.91

## 2020-07-26 LAB — CBC
HCT: 38.6 % (ref 36.0–46.0)
Hemoglobin: 12.9 g/dL (ref 12.0–15.0)
MCH: 29.7 pg (ref 26.0–34.0)
MCHC: 33.4 g/dL (ref 30.0–36.0)
MCV: 88.7 fL (ref 80.0–100.0)
Platelets: 208 10*3/uL (ref 150–400)
RBC: 4.35 MIL/uL (ref 3.87–5.11)
RDW: 14.2 % (ref 11.5–15.5)
WBC: 8.4 10*3/uL (ref 4.0–10.5)
nRBC: 0 % (ref 0.0–0.2)

## 2020-07-26 LAB — COMPREHENSIVE METABOLIC PANEL
ALT: 83 U/L — ABNORMAL HIGH (ref 0–44)
AST: 51 U/L — ABNORMAL HIGH (ref 15–41)
Albumin: 3.3 g/dL — ABNORMAL LOW (ref 3.5–5.0)
Alkaline Phosphatase: 54 U/L (ref 38–126)
Anion gap: 12 (ref 5–15)
BUN: 22 mg/dL — ABNORMAL HIGH (ref 6–20)
CO2: 23 mmol/L (ref 22–32)
Calcium: 8.6 mg/dL — ABNORMAL LOW (ref 8.9–10.3)
Chloride: 100 mmol/L (ref 98–111)
Creatinine, Ser: 0.55 mg/dL (ref 0.44–1.00)
GFR, Estimated: >60 mL/min (ref >60)
Glucose, Bld: 222 mg/dL — ABNORMAL HIGH (ref 70–99)
Potassium: 4 mmol/L (ref 3.5–5.1)
Sodium: 135 mmol/L (ref 135–145)
Total Bilirubin: 0.8 mg/dL (ref 0.3–1.2)
Total Protein: 7.7 g/dL (ref 6.5–8.1)

## 2020-07-26 LAB — FERRITIN: Ferritin: 678 ng/mL — ABNORMAL HIGH (ref 11–307)

## 2020-07-26 LAB — CBC WITH DIFFERENTIAL/PLATELET
Abs Immature Granulocytes: 0.06 10*3/uL (ref 0.00–0.07)
Basophils Absolute: 0 10*3/uL (ref 0.0–0.1)
Basophils Relative: 0 %
Eosinophils Absolute: 0 10*3/uL (ref 0.0–0.5)
Eosinophils Relative: 0 %
HCT: 41.3 % (ref 36.0–46.0)
Hemoglobin: 13.2 g/dL (ref 12.0–15.0)
Immature Granulocytes: 1 %
Lymphocytes Relative: 14 %
Lymphs Abs: 1.3 10*3/uL (ref 0.7–4.0)
MCH: 28.9 pg (ref 26.0–34.0)
MCHC: 32 g/dL (ref 30.0–36.0)
MCV: 90.6 fL (ref 80.0–100.0)
Monocytes Absolute: 0.5 10*3/uL (ref 0.1–1.0)
Monocytes Relative: 5 %
Neutro Abs: 7.9 10*3/uL — ABNORMAL HIGH (ref 1.7–7.7)
Neutrophils Relative %: 80 %
Platelets: 221 10*3/uL (ref 150–400)
RBC: 4.56 MIL/uL (ref 3.87–5.11)
RDW: 14 % (ref 11.5–15.5)
WBC: 9.8 10*3/uL (ref 4.0–10.5)
nRBC: 0 % (ref 0.0–0.2)

## 2020-07-26 LAB — LACTATE DEHYDROGENASE: LDH: 284 U/L — ABNORMAL HIGH (ref 98–192)

## 2020-07-26 LAB — TRIGLYCERIDES: Triglycerides: 373 mg/dL — ABNORMAL HIGH (ref <150)

## 2020-07-26 LAB — D-DIMER, QUANTITATIVE: D-Dimer, Quant: 0.46 ug/mL-FEU (ref 0.00–0.50)

## 2020-07-26 LAB — LIPID PANEL
Cholesterol: 357 mg/dL — ABNORMAL HIGH (ref 0–200)
HDL: 19 mg/dL — ABNORMAL LOW (ref >40)
LDL Cholesterol: 266 mg/dL — ABNORMAL HIGH (ref 0–99)
Total CHOL/HDL Ratio: 18.8 RATIO
Triglycerides: 359 mg/dL — ABNORMAL HIGH (ref <150)
VLDL: 72 mg/dL — ABNORMAL HIGH (ref 0–40)

## 2020-07-26 LAB — LACTIC ACID, PLASMA
Lactic Acid, Venous: 1.4 mmol/L (ref 0.5–1.9)
Lactic Acid, Venous: 1.2 mmol/L (ref 0.5–1.9)

## 2020-07-26 LAB — CREATININE, SERUM
Creatinine, Ser: 0.62 mg/dL (ref 0.44–1.00)
GFR, Estimated: >60 mL/min (ref >60)

## 2020-07-26 LAB — CBG MONITORING, ED: Glucose-Capillary: 268 mg/dL — ABNORMAL HIGH (ref 70–99)

## 2020-07-26 LAB — C-REACTIVE PROTEIN: CRP: 18.7 mg/dL — ABNORMAL HIGH (ref <1.0)

## 2020-07-26 LAB — GLUCOSE, CAPILLARY: Glucose-Capillary: 286 mg/dL — ABNORMAL HIGH (ref 70–99)

## 2020-07-26 LAB — FIBRINOGEN: Fibrinogen: 662 mg/dL — ABNORMAL HIGH (ref 210–475)

## 2020-07-26 LAB — PROCALCITONIN: Procalcitonin: <0.1 ng/mL

## 2020-07-26 LAB — BRAIN NATRIURETIC PEPTIDE: B Natriuretic Peptide: 30.5 pg/mL (ref 0.0–100.0)

## 2020-07-26 MED ORDER — SODIUM CHLORIDE 0.9 % IV SOLN: 250.0000 mL | INTRAVENOUS | Status: DC | PRN

## 2020-07-26 MED ORDER — IPRATROPIUM-ALBUTEROL 20-100 MCG/ACT IN AERS
1.0000 | INHALATION_SPRAY | Freq: Four times a day (QID) | RESPIRATORY_TRACT | Status: DC
  Administered 2020-07-26 – 2020-07-31 (×19): 1 via RESPIRATORY_TRACT
  Filled 2020-07-26: qty 4

## 2020-07-26 MED ORDER — SODIUM CHLORIDE 0.9% FLUSH
3.0000 mL | INTRAVENOUS | Status: DC | PRN
  Administered 2020-08-04: 3 mL via INTRAVENOUS

## 2020-07-26 MED ORDER — ONDANSETRON HCL 4 MG PO TABS
4.0000 mg | ORAL_TABLET | Freq: Four times a day (QID) | ORAL | Status: DC | PRN
  Administered 2020-08-11: 4 mg via ORAL
  Filled 2020-07-26: qty 1

## 2020-07-26 MED ORDER — GUAIFENESIN-DM 100-10 MG/5ML PO SYRP
10.0000 mL | ORAL_SOLUTION | ORAL | Status: DC | PRN
  Administered 2020-07-31: 10 mL via ORAL
  Filled 2020-07-26: qty 10

## 2020-07-26 MED ORDER — FUROSEMIDE 10 MG/ML IJ SOLN
40.0000 mg | Freq: Once | INTRAMUSCULAR | Status: AC
  Administered 2020-07-26: 40 mg via INTRAVENOUS
  Filled 2020-07-26: qty 4

## 2020-07-26 MED ORDER — SODIUM CHLORIDE 0.9% FLUSH
3.0000 mL | Freq: Two times a day (BID) | INTRAVENOUS | Status: DC
  Administered 2020-07-26 – 2020-08-11 (×31): 3 mL via INTRAVENOUS

## 2020-07-26 MED ORDER — SODIUM CHLORIDE 0.9 % IV SOLN
100.0000 mg | Freq: Every day | INTRAVENOUS | Status: AC
  Administered 2020-07-27 – 2020-07-30 (×4): 100 mg via INTRAVENOUS
  Filled 2020-07-26 (×4): qty 20

## 2020-07-26 MED ORDER — ONDANSETRON HCL 4 MG/2ML IJ SOLN: 4.0000 mg | Freq: Four times a day (QID) | INTRAMUSCULAR | Status: DC | PRN

## 2020-07-26 MED ORDER — BARICITINIB 2 MG PO TABS
4.0000 mg | ORAL_TABLET | Freq: Every day | ORAL | Status: DC
  Administered 2020-07-26 – 2020-08-04 (×10): 4 mg via ORAL
  Filled 2020-07-26 (×9): qty 2

## 2020-07-26 MED ORDER — INSULIN DETEMIR 100 UNIT/ML ~~LOC~~ SOLN
0.1500 [IU]/kg | Freq: Two times a day (BID) | SUBCUTANEOUS | Status: DC
  Administered 2020-07-26 – 2020-07-27 (×3): 20 [IU] via SUBCUTANEOUS
  Filled 2020-07-26 (×4): qty 0.2

## 2020-07-26 MED ORDER — SODIUM CHLORIDE 0.9 % IV SOLN: 100.0000 mg | Freq: Every day | INTRAVENOUS | Status: DC

## 2020-07-26 MED ORDER — SODIUM CHLORIDE 0.9 % IV SOLN: 200.0000 mg | Freq: Once | INTRAVENOUS | Status: DC

## 2020-07-26 MED ORDER — SODIUM CHLORIDE 0.9 % IV SOLN
200.0000 mg | Freq: Once | INTRAVENOUS | Status: AC
  Administered 2020-07-26: 200 mg via INTRAVENOUS
  Filled 2020-07-26: qty 40

## 2020-07-26 MED ORDER — INSULIN ASPART 100 UNIT/ML ~~LOC~~ SOLN
0.0000 [IU] | Freq: Three times a day (TID) | SUBCUTANEOUS | Status: DC
  Administered 2020-07-26: 8 [IU] via SUBCUTANEOUS
  Administered 2020-07-27: 5 [IU] via SUBCUTANEOUS
  Filled 2020-07-26: qty 0.15

## 2020-07-26 MED ORDER — ENOXAPARIN SODIUM 80 MG/0.8ML ~~LOC~~ SOLN
70.0000 mg | SUBCUTANEOUS | Status: DC
  Administered 2020-07-26 – 2020-08-05 (×11): 70 mg via SUBCUTANEOUS
  Filled 2020-07-26 (×7): qty 0.8
  Filled 2020-07-26: qty 0.7
  Filled 2020-07-26 (×4): qty 0.8

## 2020-07-26 MED ORDER — DEXAMETHASONE SODIUM PHOSPHATE 10 MG/ML IJ SOLN
6.0000 mg | INTRAMUSCULAR | Status: DC
  Administered 2020-07-26: 6 mg via INTRAVENOUS
  Filled 2020-07-26: qty 1

## 2020-07-26 MED ORDER — SODIUM CHLORIDE 0.9 % IV SOLN
200.0000 mg | Freq: Once | INTRAVENOUS | Status: AC
  Administered 2020-07-26: 200 mg via INTRAVENOUS
  Filled 2020-07-26: qty 200

## 2020-07-26 MED ORDER — ACETAMINOPHEN 325 MG PO TABS
650.0000 mg | ORAL_TABLET | Freq: Four times a day (QID) | ORAL | Status: DC | PRN
  Administered 2020-07-30 – 2020-08-10 (×7): 650 mg via ORAL
  Filled 2020-07-26 (×8): qty 2

## 2020-07-26 NOTE — ED Provider Notes (Signed)
[facility]   CSN: [id number] Arrival date & time: 07/26/20  1312     History Chief Complaint  Patient presents with  . Shortness of Breath  . Covid Positive    Penny Huerta is a 54 y.o. female.  Patient brought in by EMS.  Patient became symptomatic with Covid infection 6 days ago.  On Tuesday patient had positive test.  Yesterday patient received monoclonal antibody infusion.  Today patient with oxygen saturations at home in the 70s.  Also confirmed by EMS prior to bring her in.  Patient has had increased feeling of shortness of breath.  Denies any chest pain.  Fevers have resolved.        Past Medical History:  Diagnosis Date  . Allergy   . Anxiety   . Heart murmur    ARRHYTHMIA  . Hyperlipidemia     Patient Active Problem List   Diagnosis Date Noted  . Anxiety 02/17/2011  . Seasonal allergies 02/17/2011    Past Surgical History:  Procedure Laterality Date  . HYSTEROSCOPY W/ ENDOMETRIAL ABLATION  2011     OB History   No obstetric history on file.     Family History  Problem Relation Age of Onset  . Hyperlipidemia Mother   . Transient ischemic attack Mother   . Hypertension Mother   . Diabetes Mother   . Transient ischemic attack Father   . Ovarian cancer Paternal Grandmother   . Hyperlipidemia Maternal Grandfather   . Heart disease Maternal Grandfather   . Hyperlipidemia Maternal Grandmother   . Heart disease Maternal Grandmother   . Stroke Paternal Grandfather   . Hypertension Paternal Grandfather   . Diabetes Paternal Grandfather   . Sudden death Maternal Uncle     Social History   Tobacco Use  . Smoking status: Former Smoker    Years: 20.00    Types: Cigarettes    Quit date: 09/14/1997    Years since quitting: 22.8  . Smokeless tobacco: Never Used  Substance Use Topics  . Alcohol use: No  . Drug use: No    Home Medications Prior to Admission medications   Medication Sig Start  Date End Date Taking? Authorizing Provider  benzonatate (TESSALON) 100 MG capsule Take 1 capsule (100 mg total) by mouth every 8 (eight) hours. 07/24/20   Petrucelli, Samantha R, PA-C  fluticasone (FLONASE) 50 MCG/ACT nasal spray 2 sprays each nostril qd Patient taking differently: Place 2 sprays into both nostrils in the morning and at bedtime. 2 sprays each nostril qd 03/10/13   Zola Button, Myrene Buddy R, DO  Pseudoephedrine-Ibuprofen (ADVIL COLD/SINUS PO) Take 1 tablet by mouth daily as needed (allergies).     [provider]    Allergies    Alprazolam, Citalopram hydrobromide, Nitrofurantoin monohyd macro, Statins, and Sulfa drugs cross reactors  Review of Systems   Review of Systems  Constitutional: Negative for chills and fever.  HENT: Positive for congestion. Negative for rhinorrhea and sore throat.   Eyes: Negative for visual disturbance.  Respiratory: Positive for shortness of breath. Negative for cough.   Cardiovascular: Negative for chest pain and leg swelling.  Gastrointestinal: Negative for abdominal pain, diarrhea, nausea and vomiting.  Genitourinary: Negative for dysuria.  Musculoskeletal: Negative for back pain and neck pain.  Skin: Negative for rash.  Neurological: Negative for dizziness, light-headedness and headaches.  Hematological: Does not bruise/bleed easily.  Psychiatric/Behavioral: Negative for confusion.    Physical Exam Updated Vital Signs BP 139/67 (BP  Location: Right Arm)   Pulse 80   Temp 97.7 F (36.5 C) (Oral)   Resp (!) 23   LMP 01/06/2012   SpO2 91%   Physical Exam Vitals and nursing note reviewed.  Constitutional:      General: She is not in acute distress.    Appearance: Normal appearance. She is well-developed.  HENT:     Head: Normocephalic and atraumatic.  Eyes:     Extraocular Movements: Extraocular movements intact.     Conjunctiva/sclera: Conjunctivae normal.     Pupils: Pupils are equal, round, and reactive to light.    Cardiovascular:     Rate and Rhythm: Normal rate and regular rhythm.     Heart sounds: No murmur heard.   Pulmonary:     Effort: Pulmonary effort is normal. No respiratory distress.     Breath sounds: Normal breath sounds. No wheezing.  Abdominal:     Palpations: Abdomen is soft.     Tenderness: There is no abdominal tenderness.  Musculoskeletal:        General: No swelling.     Cervical back: Neck supple.  Skin:    General: Skin is warm and dry.     Capillary Refill: Capillary refill takes less than 2 seconds.  Neurological:     General: No focal deficit present.     Mental Status: She is alert and oriented to person, place, and time.     Cranial Nerves: No cranial nerve deficit.     Sensory: No sensory deficit.     ED Results / Procedures / Treatments   Labs (all labs ordered are listed, but only abnormal results are displayed) Labs Reviewed - No data to display  EKG None  Radiology No results found.  Procedures Procedures (including critical care time)  CRITICAL CARE Performed by: Vanetta Mulders Total critical care time: 35 minutes Critical care time was exclusive of separately billable procedures and treating other patients. Critical care was necessary to treat or prevent imminent or life-threatening deterioration. Critical care was time spent personally by me on the following activities: development of treatment plan with patient and/or surrogate as well as nursing, discussions with consultants, evaluation of patient's response to treatment, examination of patient, obtaining history from patient or surrogate, ordering and performing treatments and interventions, ordering and review of laboratory studies, ordering and review of radiographic studies, pulse oximetry and re-evaluation of patient's condition.   Medications Ordered in ED Medications - No data to display  ED Course  I have reviewed the triage vital signs and the nursing notes.  Pertinent labs &  imaging results that were available during my care of the patient were reviewed by me and considered in my medical decision making (see chart for details).    MDM Rules/Calculators/A&P                           Patient oxygen saturations here on 5 L nasal cannula oxygen is 89 to 91%.  The patient in no acute distress.  Feels more comfortable with the oxygen on.  Chest x-ray consistent with multifocal pneumonia.  Covid labs ordered.  Remdesivir ordered.  Patient did have monoclonal antibody yesterday.  Contact hospitalist for admission for Covid pneumonia with hypoxia.  Patient nontoxic no acute distress.  No chest pain.  No leg swelling.  No clinical concern for pulmonary embolus.    Final Clinical Impression(s) / ED Diagnoses Final diagnoses:  COVID  Hypoxia  Rx / DC Orders ED Discharge Orders    None       Vanetta Mulders, MD 07/26/20 [phone]

## 2020-07-26 NOTE — ED Notes (Addendum)
Per provider, respiratory called to assess need for HFNC. Respiratory at bedside.

## 2020-07-26 NOTE — H&P (Signed)
History and Physical        Hospital Admission Note Date: 07/26/2020  Patient name: Penny Huerta Medical record number: [id number] Date of birth: June 02, 1966 Age: 54 y.o. Gender: female  PCP: Patient, No Pcp Per  Patient coming from: home   Chief Complaint    Chief Complaint  Patient presents with  . Shortness of Breath  . Covid Positive      HPI:   This is a 54 year old female who was not vaccinated against COVID-19 with past medical history of obesity, atrial fibrillation not on any medications, hyperlipidemia, anxiety who had progressively worsening shortness of breath over the past week.  Started with change in taste on Saturday followed by fever on Sunday which has been intermittent throughout the week up to 102 F.  She tested positive for COVID-19 on Tuesday and received monoclonal antibody yesterday, Thursday.  Today her shortness of breath had become much more significant and she contacted 911.  Per EMS SPO2 at 70% on room air requiring NRB and given Solu-Medrol.  She was also in active A. fib at the time.  Denies any chest pain, nausea or vomiting but admits to diarrhea.  Very distant history of tobacco use, no alcohol use, no asthma or COPD or VTE's.  ED Course: Afebrile hemodynamically stable initially requiring 5 LPM which has increased to 8 HFNC to maintain SPO2 above 90%.  Notable labs: Glucose 222, AST 51, ALT 83, LDH 284, triglycerides 373, ferritin 678, CRP 18.7, lactic acid 1.4, WBC 9.8, D-dimer 0.46, fibrinogen 662.  CXR with cardiomegaly with pulmonary vascular congestion and multifocal pneumonia.  She received Solu-Medrol in route via EMS and received remdesivir in the ED.  Vitals:   07/26/20 1615 07/26/20 1630  BP: (!) 142/62 (!) 139/116  Pulse: 81 82  Resp: 16 17  Temp:    SpO2: 93% 90%     Review of Systems:  Review of Systems  Gastrointestinal:  Positive for diarrhea.  All other systems reviewed and are negative.   Medical/Social/Family History   Past Medical History: Past Medical History:  Diagnosis Date  . Allergy   . Anxiety   . Atrial fibrillation (HCC)   . Heart murmur    ARRHYTHMIA  . Hyperlipidemia     Past Surgical History:  Procedure Laterality Date  . HYSTEROSCOPY W/ ENDOMETRIAL ABLATION  2011    Medications: Prior to Admission medications   Medication Sig Start Date End Date Taking? Authorizing Provider  albuterol (VENTOLIN HFA) 108 (90 Base) MCG/ACT inhaler Inhale 1-2 puffs into the lungs every 6 (six) hours as needed for wheezing or shortness of breath.   Yes [provider]  azithromycin (ZITHROMAX) 250 MG tablet Take 500 mg by mouth daily. 5 day supply 07/23/20  Yes [provider]  benzonatate (TESSALON) 100 MG capsule Take 1 capsule (100 mg total) by mouth every 8 (eight) hours. 07/24/20  Yes Petrucelli, Samantha R, PA-C  dexamethasone (DECADRON) 4 MG tablet Take 4-12 mg by mouth See admin instructions. 12mg  for 3 days 8mg  for 3 days 4mg  for 3 days 07/23/20  Yes [provider]  ibuprofen (ADVIL) 200 MG tablet Take 800 mg by mouth every 6 (six) hours as needed for  fever, headache or mild pain.   Yes [provider]  polyvinyl alcohol (LIQUIFILM TEARS) 1.4 % ophthalmic solution Place 1 drop into both eyes as needed for dry eyes.   Yes [provider]    Allergies:   Allergies  Allergen Reactions  . Alprazolam Other (See Comments)    Caused mood changes  . Citalopram Hydrobromide Other (See Comments)    Mood changes  . Nitrofurantoin Monohyd Macro Nausea And Vomiting  . Statins Other (See Comments)    MUSCLE LOSS   . Sulfa Drugs Cross Reactors Nausea And Vomiting    Social History:  reports that she quit smoking about 22 years ago. Her smoking use included cigarettes. She quit after 20.00 years of use. She has never used smokeless tobacco. She reports  that she does not drink alcohol and does not use drugs.  Family History: Family History  Problem Relation Age of Onset  . Hyperlipidemia Mother   . Transient ischemic attack Mother   . Hypertension Mother   . Diabetes Mother   . Transient ischemic attack Father   . Ovarian cancer Paternal Grandmother   . Hyperlipidemia Maternal Grandfather   . Heart disease Maternal Grandfather   . Hyperlipidemia Maternal Grandmother   . Heart disease Maternal Grandmother   . Stroke Paternal Grandfather   . Hypertension Paternal Grandfather   . Diabetes Paternal Grandfather   . Sudden death Maternal Uncle      Objective   Physical Exam: Blood pressure (!) 139/116, pulse 82, temperature 97.7 F (36.5 C), temperature source Oral, resp. rate 17, height 5\' 6"  (1.676 m), weight 136.1 kg, last menstrual period 01/06/2012, SpO2 90 %.  Physical Exam Vitals and nursing note reviewed.  Constitutional:      General: She is not in acute distress.    Appearance: She is obese.  HENT:     Head: Normocephalic.  Eyes:     Comments: Bilateral xanthelasma  Cardiovascular:     Rate and Rhythm: Normal rate and regular rhythm.     Comments: Sinus rhythm on tele Pulmonary:     Effort: No accessory muscle usage.     Comments: Conversational dyspnea Abdominal:     Palpations: Abdomen is soft.     Tenderness: There is no guarding.  Musculoskeletal:     Right lower leg: No tenderness. No edema.     Left lower leg: No tenderness. No edema.  Skin:    General: Skin is warm.  Neurological:     General: No focal deficit present.     Mental Status: She is alert.  Psychiatric:        Mood and Affect: Mood normal.        Behavior: Behavior normal.     LABS on Admission: I have personally reviewed all the labs and imaging below    Basic Metabolic Panel: Recent Labs  Lab 07/26/20 1425  NA 135  K 4.0  CL 100  CO2 23  GLUCOSE 222*  BUN 22*  CREATININE 0.55  CALCIUM 8.6*   Liver Function  Tests: Recent Labs  Lab 07/26/20 1425  AST 51*  ALT 83*  ALKPHOS 54  BILITOT 0.8  PROT 7.7  ALBUMIN 3.3*   No results for input(s): LIPASE, AMYLASE in the last 168 hours. No results for input(s): AMMONIA in the last 168 hours. CBC: Recent Labs  Lab 07/26/20 1425  WBC 9.8  NEUTROABS 7.9*  HGB 13.2  HCT 41.3  MCV 90.6  PLT 221  Cardiac Enzymes: No results for input(s): CKTOTAL, CKMB, CKMBINDEX, TROPONINI in the last 168 hours. BNP: Invalid input(s): POCBNP CBG: No results for input(s): GLUCAP in the last 168 hours.  Radiological Exams on Admission:  DG Chest Port 1 View  Result Date: 07/26/2020 CLINICAL DATA:  Shortness of breath with cough.  COVID-19 positive EXAM: PORTABLE CHEST 1 VIEW COMPARISON:  None. FINDINGS: There is cardiomegaly with pulmonary vascular congestion. There is airspace opacity throughout the lungs bilaterally, somewhat more on the left than on the right and most notable in the left mid lung and both base regions. No adenopathy evident. No bone lesions. IMPRESSION: Cardiomegaly with pulmonary vascular congestion. Multifocal areas of airspace opacity likely due to multifocal pneumonia. It should be noted that pulmonary edema could present similarly. Both pneumonia and pulmonary edema may be present concurrently. Electronically Signed   By: Bretta BangWilliam  Woodruff III M.D.   On: 07/26/2020 13:51      EKG: Independently reviewed. Sinus rhythm   A & P   Principal Problem:   Acute hypoxemic respiratory failure due to COVID-19 [facility](HCC) Active Problems:   Hyperlipidemia   Hyperglycemia   1. Acute hypoxic respiratory failure secondary to COVID-19 and possibly with pulmonary edema O2 Requirements: 0 L/min at baseline, increasing requirement since being in the ED, currently 8 L/min CXR with multifocal pneumonia consistent with COVID-19 but also cardiomegaly with pulmonary vascular congestion - no recent echo on file, no history of CHF -> BNP CRP 18.7, D Dimer  0.46, Procalcitonin pending but does not seem to have superimposed bacterial infection- hold off antibiotics for now Remdesivir x 5 days, Steroids x 10 days, Start Baricitinib (risks and benefits of this medication were discussed with the patient who agreed to start) Inhalers, antitussives Encourage Incentive Spirometry, Flutter Valve and Pronation as tolerated Lasix 40 mg IV x 1  2. Hyperglycemia, diabetes vs steroid-induced a. Was prescribed steroids earlier in the week, glucose currently 222 b. No known history of diabetes c. Start on insulin with steroids d. HbA1c  3. Atrial fibrillation, currently sinus rhythm a. Noted per EMS b. Not on medications c. Monitor on telemetry  4. Hyperlipidemia a. Lipid panel    DVT prophylaxis: Lovenox   Code Status: Full Code  Diet: Heart healthy, carb modified Family Communication: Admission, patients condition and plan of care including tests being ordered have been discussed with the patient who indicates understanding and agrees with the plan and Code Status.  Disposition Plan: The appropriate patient status for this patient is INPATIENT. Inpatient status is judged to be reasonable and necessary in order to provide the required intensity of service to ensure the patient's safety. The patient's presenting symptoms, physical exam findings, and initial radiographic and laboratory data in the context of their chronic comorbidities is felt to place them at high risk for further clinical deterioration. Furthermore, it is not anticipated that the patient will be medically stable for discharge from the hospital within 2 midnights of admission. The following factors support the patient status of inpatient.   " The patient's presenting symptoms include shortness of breath. " The worrisome physical exam findings include increasing O2 requirements, conversational dyspnea. " The initial radiographic and laboratory data are worrisome because of elevated  inflammatory markers, COVID-19 positive. " The chronic co-morbidities include obesity, hyperlipidemia, A. fib.   * I certify that at the point of admission it is my clinical judgment that the patient will require inpatient hospital care spanning beyond 2 midnights from the point of admission due  to high intensity of service, high risk for further deterioration and high frequency of surveillance required.*   Status is: Inpatient  Remains inpatient appropriate because:IV treatments appropriate due to intensity of illness or inability to take PO and Inpatient level of care appropriate due to severity of illness   Dispo: The patient is from: Home              Anticipated d/c is to: Home              Anticipated d/c date is: > 3 days              Patient currently is not medically stable to d/c.      Consultants  . None  Procedures  . None  Time Spent on Admission: 64 minutes    Jae Dire, DO [facility]  07/26/2020, 4:55 PM

## 2020-07-26 NOTE — ED Triage Notes (Signed)
Pt bib ems from home d/t SOB. Pt tested positive for covid Tuesday 11/9. Had O2 sat of 70% on RA prior to arrival. On NRB pt sat increased to 93%. Given solumedrol with EMS.  Pt also in active a-fib.   EMS Vitals:   130/81 HR 86 RR 32 70% on RA. 93% on NRB

## 2020-07-26 NOTE — ED Notes (Signed)
Report given to [place], RN [phone]

## 2020-07-26 NOTE — Progress Notes (Signed)
Spoke to Husband Greig Castilla over the phone and updated about patient condition

## 2020-07-26 NOTE — Plan of Care (Signed)
  Problem: Education: Goal: Knowledge of risk factors and measures for prevention of condition will improve Outcome: Progressing   Problem: Coping: Goal: Psychosocial and spiritual needs will be supported Outcome: Progressing   Problem: Respiratory: Goal: Will maintain a patent airway Outcome: Progressing Goal: Complications related to the disease process, condition or treatment will be avoided or minimized Outcome: Progressing   

## 2020-07-27 ENCOUNTER — Inpatient Hospital Stay (HOSPITAL_COMMUNITY): Payer: 59

## 2020-07-27 DIAGNOSIS — E785 Hyperlipidemia, unspecified: Secondary | ICD-10-CM | POA: Diagnosis not present

## 2020-07-27 DIAGNOSIS — I5023 Acute on chronic systolic (congestive) heart failure: Secondary | ICD-10-CM | POA: Diagnosis not present

## 2020-07-27 DIAGNOSIS — I480 Paroxysmal atrial fibrillation: Secondary | ICD-10-CM | POA: Diagnosis not present

## 2020-07-27 DIAGNOSIS — U071 COVID-19: Secondary | ICD-10-CM | POA: Diagnosis not present

## 2020-07-27 DIAGNOSIS — R739 Hyperglycemia, unspecified: Secondary | ICD-10-CM | POA: Diagnosis not present

## 2020-07-27 LAB — CBC WITH DIFFERENTIAL/PLATELET
Abs Immature Granulocytes: 0.1 10*3/uL — ABNORMAL HIGH (ref 0.00–0.07)
Basophils Absolute: 0 10*3/uL (ref 0.0–0.1)
Basophils Relative: 0 %
Eosinophils Absolute: 0 10*3/uL (ref 0.0–0.5)
Eosinophils Relative: 0 %
HCT: 40.6 % (ref 36.0–46.0)
Hemoglobin: 12.9 g/dL (ref 12.0–15.0)
Immature Granulocytes: 1 %
Lymphocytes Relative: 19 %
Lymphs Abs: 1.8 10*3/uL (ref 0.7–4.0)
MCH: 29 pg (ref 26.0–34.0)
MCHC: 31.8 g/dL (ref 30.0–36.0)
MCV: 91.2 fL (ref 80.0–100.0)
Monocytes Absolute: 0.5 10*3/uL (ref 0.1–1.0)
Monocytes Relative: 5 %
Neutro Abs: 7.2 10*3/uL (ref 1.7–7.7)
Neutrophils Relative %: 75 %
Platelets: 239 10*3/uL (ref 150–400)
RBC: 4.45 MIL/uL (ref 3.87–5.11)
RDW: 14 % (ref 11.5–15.5)
WBC: 9.6 10*3/uL (ref 4.0–10.5)
nRBC: 0 % (ref 0.0–0.2)

## 2020-07-27 LAB — ECHOCARDIOGRAM COMPLETE
Area-P 1/2: 2.76 cm2
Height: 66 in
S' Lateral: 3.4 cm
Weight: 4800 oz

## 2020-07-27 LAB — COMPREHENSIVE METABOLIC PANEL
ALT: 72 U/L — ABNORMAL HIGH (ref 0–44)
AST: 33 U/L (ref 15–41)
Albumin: 3 g/dL — ABNORMAL LOW (ref 3.5–5.0)
Alkaline Phosphatase: 57 U/L (ref 38–126)
Anion gap: 11 (ref 5–15)
BUN: 27 mg/dL — ABNORMAL HIGH (ref 6–20)
CO2: 26 mmol/L (ref 22–32)
Calcium: 8.5 mg/dL — ABNORMAL LOW (ref 8.9–10.3)
Chloride: 105 mmol/L (ref 98–111)
Creatinine, Ser: 0.63 mg/dL (ref 0.44–1.00)
GFR, Estimated: >60 mL/min (ref >60)
Glucose, Bld: 240 mg/dL — ABNORMAL HIGH (ref 70–99)
Potassium: 3.9 mmol/L (ref 3.5–5.1)
Sodium: 142 mmol/L (ref 135–145)
Total Bilirubin: 0.7 mg/dL (ref 0.3–1.2)
Total Protein: 7.5 g/dL (ref 6.5–8.1)

## 2020-07-27 LAB — D-DIMER, QUANTITATIVE: D-Dimer, Quant: 0.31 ug/mL-FEU (ref 0.00–0.50)

## 2020-07-27 LAB — HIV ANTIBODY (ROUTINE TESTING W REFLEX): HIV Screen 4th Generation wRfx: NONREACTIVE

## 2020-07-27 LAB — GLUCOSE, CAPILLARY
Glucose-Capillary: 231 mg/dL — ABNORMAL HIGH (ref 70–99)
Glucose-Capillary: 261 mg/dL — ABNORMAL HIGH (ref 70–99)
Glucose-Capillary: 226 mg/dL — ABNORMAL HIGH (ref 70–99)
Glucose-Capillary: 241 mg/dL — ABNORMAL HIGH (ref 70–99)
Glucose-Capillary: 206 mg/dL — ABNORMAL HIGH (ref 70–99)

## 2020-07-27 LAB — C-REACTIVE PROTEIN: CRP: 10.6 mg/dL — ABNORMAL HIGH (ref <1.0)

## 2020-07-27 LAB — FERRITIN: Ferritin: 652 ng/mL — ABNORMAL HIGH (ref 11–307)

## 2020-07-27 LAB — HEMOGLOBIN A1C
Hgb A1c MFr Bld: 7.6 % — ABNORMAL HIGH (ref 4.8–5.6)
Mean Plasma Glucose: 171.42 mg/dL

## 2020-07-27 MED ORDER — FUROSEMIDE 10 MG/ML IJ SOLN
40.0000 mg | Freq: Every day | INTRAMUSCULAR | Status: DC
  Administered 2020-07-27 – 2020-07-28 (×2): 40 mg via INTRAVENOUS
  Filled 2020-07-27 (×2): qty 4

## 2020-07-27 MED ORDER — INSULIN ASPART 100 UNIT/ML ~~LOC~~ SOLN
0.0000 [IU] | Freq: Every day | SUBCUTANEOUS | Status: DC
  Administered 2020-07-27 – 2020-07-30 (×3): 2 [IU] via SUBCUTANEOUS
  Administered 2020-07-31: 3 [IU] via SUBCUTANEOUS
  Administered 2020-08-01: 2 [IU] via SUBCUTANEOUS

## 2020-07-27 MED ORDER — LINAGLIPTIN 5 MG PO TABS
5.0000 mg | ORAL_TABLET | Freq: Every day | ORAL | Status: DC
  Administered 2020-07-27 – 2020-08-12 (×17): 5 mg via ORAL
  Filled 2020-07-27 (×17): qty 1

## 2020-07-27 MED ORDER — METHYLPREDNISOLONE SODIUM SUCC 125 MG IJ SOLR
80.0000 mg | Freq: Two times a day (BID) | INTRAMUSCULAR | Status: DC
  Administered 2020-07-27 – 2020-08-02 (×12): 80 mg via INTRAVENOUS
  Filled 2020-07-27 (×9): qty 2

## 2020-07-27 MED ORDER — INSULIN ASPART 100 UNIT/ML ~~LOC~~ SOLN
0.0000 [IU] | Freq: Three times a day (TID) | SUBCUTANEOUS | Status: DC
  Administered 2020-07-27 – 2020-07-28 (×3): 7 [IU] via SUBCUTANEOUS
  Administered 2020-07-28: 11 [IU] via SUBCUTANEOUS
  Administered 2020-07-28 – 2020-07-29 (×2): 7 [IU] via SUBCUTANEOUS
  Administered 2020-07-29 (×2): 11 [IU] via SUBCUTANEOUS
  Administered 2020-07-30: 7 [IU] via SUBCUTANEOUS
  Administered 2020-07-30: 15 [IU] via SUBCUTANEOUS
  Administered 2020-07-30: 7 [IU] via SUBCUTANEOUS
  Administered 2020-07-31: 11 [IU] via SUBCUTANEOUS
  Administered 2020-07-31: 7 [IU] via SUBCUTANEOUS
  Administered 2020-07-31: 11 [IU] via SUBCUTANEOUS
  Administered 2020-08-01 (×3): 7 [IU] via SUBCUTANEOUS
  Administered 2020-08-02: 15 [IU] via SUBCUTANEOUS
  Administered 2020-08-02: 7 [IU] via SUBCUTANEOUS
  Administered 2020-08-03: 11 [IU] via SUBCUTANEOUS
  Administered 2020-08-03: 7 [IU] via SUBCUTANEOUS
  Administered 2020-08-03: 11 [IU] via SUBCUTANEOUS
  Administered 2020-08-04: 3 [IU] via SUBCUTANEOUS
  Administered 2020-08-04 (×2): 7 [IU] via SUBCUTANEOUS
  Administered 2020-08-05 – 2020-08-06 (×2): 4 [IU] via SUBCUTANEOUS
  Administered 2020-08-06 (×2): 7 [IU] via SUBCUTANEOUS
  Administered 2020-08-07: 4 [IU] via SUBCUTANEOUS
  Administered 2020-08-07: 3 [IU] via SUBCUTANEOUS
  Administered 2020-08-07: 4 [IU] via SUBCUTANEOUS
  Administered 2020-08-08 – 2020-08-10 (×4): 3 [IU] via SUBCUTANEOUS
  Administered 2020-08-10: 4 [IU] via SUBCUTANEOUS
  Administered 2020-08-12: 3 [IU] via SUBCUTANEOUS

## 2020-07-27 NOTE — Progress Notes (Signed)
  Echocardiogram 2D Echocardiogram has been performed.  Priyanka Causey G Alnisa Hasley 07/27/2020, 2:54 PM

## 2020-07-27 NOTE — Progress Notes (Signed)
PROGRESS NOTE  Penny Huerta  ELF:[id number] DOB: 15-Jun-1966 DOA: 07/26/2020 PCP: Patient, No Pcp Per Brief Narrative: Penny Huerta is a 54 y.o. female with a history of obesity, PAF, HLD, anxiety, and antigen-positive covid-19 infection diagnosed 11/9 s/p sotrovimab 11/10 who presented by EMS to the ED 11/12 with hypoxia and shortness of breath requiring nonrebreather initially, later transitioned to HFNC. CXR revealed cardiomegaly, vascular congestion, and multifocal infiltrates consistent with pulmonary edema mixed with covid-19 pneumonia. CRP 18.7, PCT negative. Lasix was given as well as remdesivir, decadron, baricitinib and hypoxia worsened requiring 15L HFNC.   Assessment & Plan: Principal Problem:   Acute hypoxemic respiratory failure due to COVID-19 [facility]) Active Problems:   Hyperlipidemia   Hyperglycemia  Acute hypoxemic respiratory failure due to covid-19 pneumonia and pulmonary edema: SARS-CoV-2 Ag positive on 11/9 s/p mAb 11/10.  - Continue remdesivir x5 days (11/12 - 11/16) - Continue steroids, augment to solumedrol - Continue baricitinib, started 11/12, due to cytokine storm.  - Encourage OOB, IS, FV, and awake proning if able - Continue airborne, contact precautions for 21 days from positive testing. - Monitor CMP and inflammatory markers - Enoxaparin prophylactic dose.   Acute pulmonary edema: Suspected based on CXR.  - Continue lasix (metabolic parameters stable), repeat this AM.  - Strict I/O, daily weights - Check echo w/cardiomegaly.   T2DM: New diagnosis with HbA1c 7.6% with steroid-induced hyperglycemia (started PTA).  - Continue SSI - augment to resistant scale. Add HS correction, continue 0.3u/kg total daily dose levemir, add linagliptin  PAF: Current NSR, rate controlled with in AFib.  - Continue telemetry for now.   Morbid obesity: Estimated body mass index is 48.42 kg/m as calculated from the following:   Height as of this encounter: 5\' 6"  (1.676 m).    Weight as of this encounter: 136.1 kg.   HLD: LDL calculated to be 266, HDL 19, and triglycerides (does not appear to be fasting study).  - Pt has listed allergy to statins.   DVT prophylaxis: Lovenox 0.5mg /kg q24h Code Status: Full Family Communication: Husband called with no answer this morning. Disposition Plan:  Status is: Inpatient  Remains inpatient appropriate because:Inpatient level of care appropriate due to severity of illness  Dispo: The patient is from: Home              Anticipated d/c is to: Home              Anticipated d/c date is: > 3 days              Patient currently is not medically stable to d/c.  Consultants:   None  Procedures:   None  Antimicrobials:  Remdesivir   Subjective: Shortness of breath is stable, though generally felt better after lasix administration yesterday. Some cough, no reported chest pain or leg swelling. Did not report orthopnea. No hx VTE.  Objective: Vitals:   07/26/20 2300 07/27/20 0328 07/27/20 0752 07/27/20 0800  BP: (!) 144/76 (!) 141/73 (!) 160/81   Pulse: 83 75 77   Resp:   (!) 24   Temp: 98.6 F (37 C) 98.7 F (37.1 C) 99 F (37.2 C)   TempSrc: Oral Oral Oral   SpO2: (!) 87% (!) 87% (!) 85% (!) 86%  Weight:      Height:        Intake/Output Summary (Last 24 hours) at 07/27/2020 1025 Last data filed at 07/27/2020 0700 Gross per 24 hour  Intake 1966 ml  Output 1010  ml  Net 956 ml   Filed Weights   07/26/20 1350  Weight: 136.1 kg    Gen: 54 y.o. female in no distress, tired-appearing Pulm: Non-labored but tachypneic. Diminished aeration, decreased with crackles at bases.  CV: Regular rate and rhythm. No murmur, rub, or gallop. UTD JVD, Trace pitting pedal edema. GI: Abdomen soft, non-tender, non-distended, with normoactive bowel sounds. No organomegaly or masses felt. Ext: Warm, no deformities Skin: No rashes, lesions or ulcers Neuro: Alert and oriented. No focal neurological deficits. Psych:  Judgement and insight appear normal. Mood & affect appropriate.   Data Reviewed: I have personally reviewed following labs and imaging studies  CBC: Recent Labs  Lab 07/26/20 1425 07/26/20 1728 07/27/20 0552  WBC 9.8 8.4 9.6  NEUTROABS 7.9*  --  7.2  HGB 13.2 12.9 12.9  HCT 41.3 38.6 40.6  MCV 90.6 88.7 91.2  PLT 221 208 239   Basic Metabolic Panel: Recent Labs  Lab 07/26/20 1425 07/26/20 1728 07/27/20 0552  NA 135  --  142  K 4.0  --  3.9  CL 100  --  105  CO2 23  --  26  GLUCOSE 222*  --  240*  BUN 22*  --  27*  CREATININE 0.55 0.62 0.63  CALCIUM 8.6*  --  8.5*   GFR: Estimated Creatinine Clearance: 115.5 mL/min (by C-G formula based on SCr of 0.63 mg/dL). Liver Function Tests: Recent Labs  Lab 07/26/20 1425 07/27/20 0552  AST 51* 33  ALT 83* 72*  ALKPHOS 54 57  BILITOT 0.8 0.7  PROT 7.7 7.5  ALBUMIN 3.3* 3.0*   No results for input(s): LIPASE, AMYLASE in the last 168 hours. No results for input(s): AMMONIA in the last 168 hours. Coagulation Profile: No results for input(s): INR, PROTIME in the last 168 hours. Cardiac Enzymes: No results for input(s): CKTOTAL, CKMB, CKMBINDEX, TROPONINI in the last 168 hours. BNP (last 3 results) No results for input(s): PROBNP in the last 8760 hours. HbA1C: Recent Labs    07/26/20 1728  HGBA1C 7.6*   CBG: Recent Labs  Lab 07/26/20 1754 07/26/20 2019 07/26/20 2359 07/27/20 0331 07/27/20 0749  GLUCAP 268* 286* 261* 231* 226*   Lipid Profile: Recent Labs    07/26/20 1425 07/26/20 1728  CHOL  --  357*  HDL  --  19*  LDLCALC  --  266*  TRIG 373* 359*  CHOLHDL  --  18.8   Thyroid Function Tests: No results for input(s): TSH, T4TOTAL, FREET4, T3FREE, THYROIDAB in the last 72 hours. Anemia Panel: Recent Labs    07/26/20 1425 07/27/20 0552  FERRITIN 678* 652*   Urine analysis:    Component Value Date/Time   BILIRUBINUR Neg 03/10/2013 1510   PROTEINUR Neg 03/10/2013 1510   UROBILINOGEN 0.2  03/10/2013 1510   NITRITE Neg 03/10/2013 1510   LEUKOCYTESUR Negative 03/10/2013 1510   Recent Results (from the past 240 hour(s))  Blood Culture (routine x 2)     Status: None (Preliminary result)   Collection Time: 07/26/20  2:25 PM   Specimen: Site Not Specified; Blood  Result Value Ref Range Status   Specimen Description   Final    SITE NOT SPECIFIED Performed at [facility], [address] [place], [place] [address]    Special Requests   Final    BOTTLES DRAWN AEROBIC AND ANAEROBIC Blood Culture adequate volume Performed at [facility], [address] [place], [place] [address]    Culture  Final    NO GROWTH < 24 HOURS Performed at [facility], [address] [place], [place] [address]    Report Status PENDING  Incomplete  Blood Culture (routine x 2)     Status: None (Preliminary result)   Collection Time: 07/26/20  2:25 PM   Specimen: Site Not Specified; Blood  Result Value Ref Range Status   Specimen Description   Final    SITE NOT SPECIFIED Performed at [facility], [address] [place], [place] [address]    Special Requests   Final    BOTTLES DRAWN AEROBIC AND ANAEROBIC Blood Culture results may not be optimal due to an inadequate volume of blood received in culture bottles Performed at [facility], [address] [place], [place] [address]    Culture   Final    NO GROWTH < 24 HOURS Performed at [facility], [address], [place], [place] [address]    Report Status PENDING  Incomplete      Radiology Studies: DG Chest Port 1 View  Result Date: 07/26/2020 CLINICAL DATA:  Shortness of breath with cough.  COVID-19 positive EXAM: PORTABLE CHEST 1 VIEW COMPARISON:  None. FINDINGS: There is cardiomegaly with pulmonary vascular congestion. There is airspace opacity throughout the lungs bilaterally, somewhat more on the left than on the right and most notable in the  left mid lung and both base regions. No adenopathy evident. No bone lesions. IMPRESSION: Cardiomegaly with pulmonary vascular congestion. Multifocal areas of airspace opacity likely due to multifocal pneumonia. It should be noted that pulmonary edema could present similarly. Both pneumonia and pulmonary edema may be present concurrently. Electronically Signed   By: Bretta Bang III M.D.   On: 07/26/2020 13:51    Scheduled Meds: . baricitinib  4 mg Oral Daily  . dexamethasone (DECADRON) injection  6 mg Intravenous Q24H  . enoxaparin (LOVENOX) injection  70 mg Subcutaneous Q24H  . furosemide  40 mg Intravenous Daily  . insulin aspart  0-15 Units Subcutaneous TID WC  . insulin detemir  0.15 Units/kg Subcutaneous BID  . Ipratropium-Albuterol  1 puff Inhalation Q6H  . sodium chloride flush  3 mL Intravenous Q12H   Continuous Infusions: . sodium chloride    . remdesivir 100 mg in NS 100 mL 100 mg (07/27/20 0912)     LOS: 1 day   Time spent: 35 minutes.  Tyrone Nine, MD [facility] [email or web address] 07/27/2020, 10:25 AM

## 2020-07-28 DIAGNOSIS — E785 Hyperlipidemia, unspecified: Secondary | ICD-10-CM | POA: Diagnosis not present

## 2020-07-28 DIAGNOSIS — I480 Paroxysmal atrial fibrillation: Secondary | ICD-10-CM | POA: Diagnosis not present

## 2020-07-28 DIAGNOSIS — U071 COVID-19: Secondary | ICD-10-CM | POA: Diagnosis not present

## 2020-07-28 DIAGNOSIS — R739 Hyperglycemia, unspecified: Secondary | ICD-10-CM | POA: Diagnosis not present

## 2020-07-28 LAB — COMPREHENSIVE METABOLIC PANEL
ALT: 56 U/L — ABNORMAL HIGH (ref 0–44)
AST: 28 U/L (ref 15–41)
Albumin: 3 g/dL — ABNORMAL LOW (ref 3.5–5.0)
Alkaline Phosphatase: 54 U/L (ref 38–126)
Anion gap: 12 (ref 5–15)
BUN: 28 mg/dL — ABNORMAL HIGH (ref 6–20)
CO2: 25 mmol/L (ref 22–32)
Calcium: 8.3 mg/dL — ABNORMAL LOW (ref 8.9–10.3)
Chloride: 103 mmol/L (ref 98–111)
Creatinine, Ser: 0.66 mg/dL (ref 0.44–1.00)
GFR, Estimated: >60 mL/min (ref >60)
Glucose, Bld: 239 mg/dL — ABNORMAL HIGH (ref 70–99)
Potassium: 3.5 mmol/L (ref 3.5–5.1)
Sodium: 140 mmol/L (ref 135–145)
Total Bilirubin: 0.8 mg/dL (ref 0.3–1.2)
Total Protein: 7.3 g/dL (ref 6.5–8.1)

## 2020-07-28 LAB — CBC WITH DIFFERENTIAL/PLATELET
Abs Immature Granulocytes: 0.11 10*3/uL — ABNORMAL HIGH (ref 0.00–0.07)
Basophils Absolute: 0 10*3/uL (ref 0.0–0.1)
Basophils Relative: 0 %
Eosinophils Absolute: 0 10*3/uL (ref 0.0–0.5)
Eosinophils Relative: 0 %
HCT: 42.7 % (ref 36.0–46.0)
Hemoglobin: 13.6 g/dL (ref 12.0–15.0)
Immature Granulocytes: 1 %
Lymphocytes Relative: 13 %
Lymphs Abs: 2 10*3/uL (ref 0.7–4.0)
MCH: 28.6 pg (ref 26.0–34.0)
MCHC: 31.9 g/dL (ref 30.0–36.0)
MCV: 89.9 fL (ref 80.0–100.0)
Monocytes Absolute: 0.6 10*3/uL (ref 0.1–1.0)
Monocytes Relative: 4 %
Neutro Abs: 12 10*3/uL — ABNORMAL HIGH (ref 1.7–7.7)
Neutrophils Relative %: 82 %
Platelets: 302 10*3/uL (ref 150–400)
RBC: 4.75 MIL/uL (ref 3.87–5.11)
RDW: 14 % (ref 11.5–15.5)
WBC: 14.7 10*3/uL — ABNORMAL HIGH (ref 4.0–10.5)
nRBC: 0 % (ref 0.0–0.2)

## 2020-07-28 LAB — C-REACTIVE PROTEIN: CRP: 4.3 mg/dL — ABNORMAL HIGH (ref <1.0)

## 2020-07-28 LAB — FERRITIN: Ferritin: 520 ng/mL — ABNORMAL HIGH (ref 11–307)

## 2020-07-28 LAB — GLUCOSE, CAPILLARY
Glucose-Capillary: 202 mg/dL — ABNORMAL HIGH (ref 70–99)
Glucose-Capillary: 241 mg/dL — ABNORMAL HIGH (ref 70–99)
Glucose-Capillary: 269 mg/dL — ABNORMAL HIGH (ref 70–99)
Glucose-Capillary: 211 mg/dL — ABNORMAL HIGH (ref 70–99)
Glucose-Capillary: 145 mg/dL — ABNORMAL HIGH (ref 70–99)

## 2020-07-28 LAB — D-DIMER, QUANTITATIVE: D-Dimer, Quant: 0.46 ug/mL-FEU (ref 0.00–0.50)

## 2020-07-28 MED ORDER — POTASSIUM CHLORIDE CRYS ER 20 MEQ PO TBCR
40.0000 meq | EXTENDED_RELEASE_TABLET | Freq: Once | ORAL | Status: AC
  Administered 2020-07-28: 40 meq via ORAL
  Filled 2020-07-28: qty 2

## 2020-07-28 MED ORDER — INSULIN DETEMIR 100 UNIT/ML ~~LOC~~ SOLN
25.0000 [IU] | Freq: Two times a day (BID) | SUBCUTANEOUS | Status: DC
  Administered 2020-07-28 – 2020-07-29 (×3): 25 [IU] via SUBCUTANEOUS
  Filled 2020-07-28 (×3): qty 0.25

## 2020-07-28 MED ORDER — INSULIN ASPART 100 UNIT/ML ~~LOC~~ SOLN
4.0000 [IU] | Freq: Three times a day (TID) | SUBCUTANEOUS | Status: DC
  Administered 2020-07-28 – 2020-07-29 (×4): 4 [IU] via SUBCUTANEOUS

## 2020-07-28 NOTE — Progress Notes (Signed)
PROGRESS NOTE  Ivory BroadKelly M Magda  VWU:[id number] DOB: [birth year] DOA: 07/26/2020 PCP: Patient, No Pcp Per Brief Narrative: Gwynneth MacleodKelly M Sarkisyan is a 54 y.o. female with a history of obesity, PAF, HLD, anxiety, and antigen-positive covid-19 infection diagnosed 11/9 s/p sotrovimab 11/10 who presented by EMS to the ED 11/12 with hypoxia and shortness of breath requiring nonrebreather initially, later transitioned to HFNC. CXR revealed cardiomegaly, vascular congestion, and multifocal infiltrates consistent with pulmonary edema mixed with covid-19 pneumonia. CRP 18.7, PCT negative. Lasix was given as well as remdesivir, decadron, baricitinib and hypoxia worsened requiring 15L HFNC.   Assessment & Plan: Principal Problem:   Acute hypoxemic respiratory failure due to COVID-19 [facility](HCC) Active Problems:   Hyperlipidemia   Hyperglycemia  Acute hypoxemic respiratory failure due to covid-19 pneumonia and pulmonary edema: SARS-CoV-2 Ag positive on 11/9 s/p mAb 11/10.  - Continue remdesivir x5 days (11/12 - 11/16) - Continue solumedrol, CRP down >50%. - Continue baricitinib, started 11/12  - Encourage OOB, IS, FV, and awake proning if able - Continue airborne, contact precautions for 21 days from positive testing. - Monitor CMP and inflammatory markers - Enoxaparin weight-based prophylactic dose.   Acute pulmonary edema, acute diastolic heart feailure: LVEF preserved with G1DD, plethoric IVC. - With good UOP, will keep lasix dose stable and monitor Cr (stable). K 3.5, will give supplement empirically and monitor. - Strict I/O, daily weights  T2DM: New diagnosis with HbA1c 7.6% with steroid-induced hyperglycemia (started PTA).  - Continue resistant SSI, HS correction. Increase levemir this AM. Will add mealtime novolog as well.   PAF: Current NSR, rate controlled with in AFib.  - Continue telemetry for now.   Morbid obesity: Estimated body mass index is 48.48 kg/m as calculated from the following:   Height as  of this encounter: 5\' 6"  (1.676 m).   Weight as of this encounter: 136.3 kg.   HLD: LDL calculated to be 266, HDL 19, and triglycerides [id number] (does not appear to be fasting study).  - Pt has listed allergy to statins. Consider repeat fasting panel, outpatient follow up/lipid clinic.  DVT prophylaxis: Lovenox 0.5mg /kg q24h Code Status: Full Family Communication: Husband by phone. Disposition Plan:  Status is: Inpatient  Remains inpatient appropriate because:Inpatient level of care appropriate due to severity of illness  Dispo: The patient is from: Home              Anticipated d/c is to: Home              Anticipated d/c date is: > 3 days              Patient currently is not medically stable to d/c.  Consultants:   None  Procedures:   None  Antimicrobials:  Remdesivir   Subjective: Feels modest improvement from yesterday but still very short of breath limiting any exertion. Dyspnea is constant, severe, improved with supplemental oxygen. Had significant UOP (5L) over past 24 hours.  Objective: Vitals:   07/28/20 0609 07/28/20 0800 07/28/20 0824 07/28/20 1145  BP: (!) 162/77   136/78  Pulse: 76  68 71  Resp: 20  (!) 23   Temp: 98.5 F (36.9 C)   98.9 F (37.2 C)  TempSrc: Oral   Oral  SpO2: 90% (!) 88% (!) 88% (!) 82%  Weight:      Height:        Intake/Output Summary (Last 24 hours) at 07/28/2020 1443 Last data filed at 07/28/2020 0500 Gross per 24 hour  Intake 300  ml  Output 510 ml  Net -210 ml   Filed Weights   07/26/20 1350 07/28/20 0500  Weight: 136.1 kg (!) 136.3 kg   Gen: 54 y.o. female in no distress Pulm: Tachypneic with crackles diffusely, diminished diffusely. CV: Regular rate and rhythm. No murmur, rub, or gallop. No definite JVD, no pitting dependent edema. GI: Abdomen soft, non-tender, non-distended, with normoactive bowel sounds.  Ext: Warm, no deformities Skin: No rashes, lesions or ulcers on visualized skin. Neuro: Alert and oriented. No  focal neurological deficits. Psych: Judgement and insight appear fair. Mood euthymic & affect congruent. Behavior is appropriate.    Data Reviewed: I have personally reviewed following labs and imaging studies  CBC: Recent Labs  Lab 07/26/20 1425 07/26/20 1728 07/27/20 0552 07/28/20 0509  WBC 9.8 8.4 9.6 14.7*  NEUTROABS 7.9*  --  7.2 12.0*  HGB 13.2 12.9 12.9 13.6  HCT 41.3 38.6 40.6 42.7  MCV 90.6 88.7 91.2 89.9  PLT 221 208 239 302   Basic Metabolic Panel: Recent Labs  Lab 07/26/20 1425 07/26/20 1728 07/27/20 0552 07/28/20 0509  NA 135  --  142 140  K 4.0  --  3.9 3.5  CL 100  --  105 103  CO2 23  --  26 25  GLUCOSE 222*  --  240* 239*  BUN 22*  --  27* 28*  CREATININE 0.55 0.62 0.63 0.66  CALCIUM 8.6*  --  8.5* 8.3*   GFR: Estimated Creatinine Clearance: 115.7 mL/min (by C-G formula based on SCr of 0.66 mg/dL). Liver Function Tests: Recent Labs  Lab 07/26/20 1425 07/27/20 0552 07/28/20 0509  AST 51* 33 28  ALT 83* 72* 56*  ALKPHOS 54 57 54  BILITOT 0.8 0.7 0.8  PROT 7.7 7.5 7.3  ALBUMIN 3.3* 3.0* 3.0*   HbA1C: Recent Labs    07/26/20 1728  HGBA1C 7.6*   CBG: Recent Labs  Lab 07/27/20 1153 07/27/20 1630 07/27/20 2121 07/28/20 0737 07/28/20 1139  GLUCAP 241* 206* 202* 241* 269*   Lipid Profile: Recent Labs    07/26/20 1425 07/26/20 1728  CHOL  --  357*  HDL  --  19*  LDLCALC  --  266*  TRIG 373* 359*  CHOLHDL  --  18.8   Thyroid Function Tests: No results for input(s): TSH, T4TOTAL, FREET4, T3FREE, THYROIDAB in the last 72 hours. Anemia Panel: Recent Labs    07/27/20 0552 07/28/20 0509  FERRITIN 652* 520*   Urine analysis:    Component Value Date/Time   BILIRUBINUR Neg 03/10/2013 1510   PROTEINUR Neg 03/10/2013 1510   UROBILINOGEN 0.2 03/10/2013 1510   NITRITE Neg 03/10/2013 1510   LEUKOCYTESUR Negative 03/10/2013 1510   Recent Results (from the past 240 hour(s))  Blood Culture (routine x 2)     Status: None  (Preliminary result)   Collection Time: 07/26/20  2:25 PM   Specimen: Site Not Specified; Blood  Result Value Ref Range Status   Specimen Description   Final    SITE NOT SPECIFIED Performed at [facility], [address] [place], [place] [address]    Special Requests   Final    BOTTLES DRAWN AEROBIC AND ANAEROBIC Blood Culture adequate volume Performed at [facility], [address] [place], [place] [address]    Culture   Final    NO GROWTH 2 DAYS Performed at [facility], [address], [place], [place] [address]    Report Status PENDING  Incomplete  Blood Culture (routine x 2)     Status: None (Preliminary result)   Collection Time: 07/26/20  2:25 PM   Specimen: Site Not Specified; Blood  Result Value Ref Range Status   Specimen Description   Final    SITE NOT SPECIFIED Performed at [facility], [address], [place], [place] [address]    Special Requests   Final    BOTTLES DRAWN AEROBIC AND ANAEROBIC Blood Culture results may not be optimal due to an inadequate volume of blood received in culture bottles Performed at [facility], [address], [place], [place] [address]    Culture   Final    NO GROWTH 2 DAYS Performed at [facility], [address], [place], [place] [address]    Report Status PENDING  Incomplete      Radiology Studies: ECHOCARDIOGRAM COMPLETE  Result Date: 07/27/2020    ECHOCARDIOGRAM REPORT   Patient Name:   PRESLYNN BIER Date of Exam: 07/27/2020 Medical Rec #:  [id number]     Height:       66.0 in Accession #:    [id number]    Weight:       300.0 lb Date of Birth:  1966-01-17     BSA:          2.376 m Patient Age:    54 years      BP:           160/81 mmHg Patient Gender: F             HR:           75 bpm. Exam Location:  Inpatient Procedure: 2D Echo, Cardiac Doppler and Color Doppler Indications:    I50.23 Acute on chronic systolic (congestive) heart  failure  History:        Patient has prior history of Echocardiogram examinations, most                 recent 03/10/2011. Arrythmias:Atrial Fibrillation,                 Signs/Symptoms:Murmur; Risk Factors:Dyslipidemia. COVID-19                 Positive.  Sonographer:    Elmarie Shiley Dance Referring Phys: [id number] Tyrone Nine IMPRESSIONS  1. Left ventricular ejection fraction, by estimation, is 55 to 60%. The left ventricle has normal function. The left ventricle has no regional wall motion abnormalities. Left ventricular diastolic parameters are consistent with Grade I diastolic dysfunction (impaired relaxation).  2. Right ventricular systolic function is normal. The right ventricular size is normal.  3. The mitral valve is normal in structure. No evidence of mitral valve regurgitation. No evidence of mitral stenosis.  4. The aortic valve has an indeterminant number of cusps. Aortic valve regurgitation is not visualized. No aortic stenosis is present.  5. The inferior vena cava is dilated in size with >50% respiratory variability, suggesting right atrial pressure of 8 mmHg. FINDINGS  Left Ventricle: Left ventricular ejection fraction, by estimation, is 55 to 60%. The left ventricle has normal function. The left ventricle has no regional wall motion abnormalities. The left ventricular internal cavity size was normal in size. There is  no left ventricular hypertrophy. Left ventricular diastolic parameters are consistent with Grade I diastolic dysfunction (impaired relaxation). Right Ventricle: The right ventricular size is normal. Right ventricular systolic function is normal. Left Atrium: Left atrial size was normal in size. Right Atrium: Right  atrial size was normal in size. Pericardium: There is no evidence of pericardial effusion. Mitral Valve: The mitral valve is normal in structure. Mild mitral annular calcification. No evidence of mitral valve regurgitation. No evidence of mitral valve stenosis. Tricuspid Valve: The  tricuspid valve is normal in structure. Tricuspid valve regurgitation is trivial. No evidence of tricuspid stenosis. Aortic Valve: The aortic valve has an indeterminant number of cusps. Aortic valve regurgitation is not visualized. No aortic stenosis is present. Pulmonic Valve: The pulmonic valve was not well visualized. Pulmonic valve regurgitation is not visualized. No evidence of pulmonic stenosis. Aorta: The aortic root is normal in size and structure. Venous: The inferior vena cava is dilated in size with greater than 50% respiratory variability, suggesting right atrial pressure of 8 mmHg. IAS/Shunts: No atrial level shunt detected by color flow Doppler.  LEFT VENTRICLE PLAX 2D LVIDd:         5.30 cm  Diastology LVIDs:         3.40 cm  LV e' medial:    7.29 cm/s LV PW:         1.10 cm  LV E/e' medial:  15.4 LV IVS:        1.10 cm  LV e' lateral:   5.00 cm/s LVOT diam:     1.80 cm  LV E/e' lateral: 22.4 LV SV:         53 LV SV Index:   22 LVOT Area:     2.54 cm  RIGHT VENTRICLE             IVC RV Basal diam:  2.20 cm     IVC diam: 2.50 cm RV S prime:     17.30 cm/s TAPSE (M-mode): 1.8 cm LEFT ATRIUM             Index       RIGHT ATRIUM           Index LA diam:        4.00 cm 1.68 cm/m  RA Area:     19.70 cm LA Vol (A2C):   72.6 ml 30.56 ml/m RA Volume:   57.90 ml  24.37 ml/m LA Vol (A4C):   54.7 ml 23.02 ml/m LA Biplane Vol: 65.7 ml 27.65 ml/m  AORTIC VALVE LVOT Vmax:   94.80 cm/s LVOT Vmean:  69.000 cm/s LVOT VTI:    0.210 m  AORTA Ao Root diam: 3.00 cm Ao Asc diam:  3.00 cm MITRAL VALVE MV Area (PHT): 2.76 cm     SHUNTS MV Decel Time: 275 msec     Systemic VTI:  0.21 m MV E velocity: 112.00 cm/s  Systemic Diam: 1.80 cm MV A velocity: 109.00 cm/s MV E/A ratio:  1.03 Olga Millers MD Electronically signed by Olga Millers MD Signature Date/Time: 07/27/2020/3:00:21 PM    Final     Scheduled Meds: . baricitinib  4 mg Oral Daily  . enoxaparin (LOVENOX) injection  70 mg Subcutaneous Q24H  .  furosemide  40 mg Intravenous Daily  . insulin aspart  0-20 Units Subcutaneous TID WC  . insulin aspart  0-5 Units Subcutaneous QHS  . insulin detemir  25 Units Subcutaneous BID  . Ipratropium-Albuterol  1 puff Inhalation Q6H  . linagliptin  5 mg Oral Daily  . methylPREDNISolone (SOLU-MEDROL) injection  80 mg Intravenous Q12H  . sodium chloride flush  3 mL Intravenous Q12H   Continuous Infusions: . sodium chloride    . remdesivir 100 mg in NS 100 mL  100 mg (07/28/20 1049)     LOS: 2 days   Time spent: 35 minutes.  Tyrone Nine, MD [facility] [email or web address] 07/28/2020, 2:43 PM

## 2020-07-29 DIAGNOSIS — R739 Hyperglycemia, unspecified: Secondary | ICD-10-CM | POA: Diagnosis not present

## 2020-07-29 DIAGNOSIS — I480 Paroxysmal atrial fibrillation: Secondary | ICD-10-CM | POA: Diagnosis not present

## 2020-07-29 DIAGNOSIS — U071 COVID-19: Secondary | ICD-10-CM | POA: Diagnosis not present

## 2020-07-29 DIAGNOSIS — E785 Hyperlipidemia, unspecified: Secondary | ICD-10-CM | POA: Diagnosis not present

## 2020-07-29 LAB — COMPREHENSIVE METABOLIC PANEL
ALT: 59 U/L — ABNORMAL HIGH (ref 0–44)
AST: 65 U/L — ABNORMAL HIGH (ref 15–41)
Albumin: 3 g/dL — ABNORMAL LOW (ref 3.5–5.0)
Alkaline Phosphatase: 53 U/L (ref 38–126)
Anion gap: 12 (ref 5–15)
BUN: 31 mg/dL — ABNORMAL HIGH (ref 6–20)
CO2: 25 mmol/L (ref 22–32)
Calcium: 8.2 mg/dL — ABNORMAL LOW (ref 8.9–10.3)
Chloride: 103 mmol/L (ref 98–111)
Creatinine, Ser: 0.77 mg/dL (ref 0.44–1.00)
GFR, Estimated: >60 mL/min (ref >60)
Glucose, Bld: 196 mg/dL — ABNORMAL HIGH (ref 70–99)
Potassium: 3.5 mmol/L (ref 3.5–5.1)
Sodium: 140 mmol/L (ref 135–145)
Total Bilirubin: 1 mg/dL (ref 0.3–1.2)
Total Protein: 7 g/dL (ref 6.5–8.1)

## 2020-07-29 LAB — GLUCOSE, CAPILLARY
Glucose-Capillary: 266 mg/dL — ABNORMAL HIGH (ref 70–99)
Glucose-Capillary: 216 mg/dL — ABNORMAL HIGH (ref 70–99)
Glucose-Capillary: 193 mg/dL — ABNORMAL HIGH (ref 70–99)
Glucose-Capillary: 201 mg/dL — ABNORMAL HIGH (ref 70–99)

## 2020-07-29 LAB — C-REACTIVE PROTEIN: CRP: 2.2 mg/dL — ABNORMAL HIGH (ref <1.0)

## 2020-07-29 LAB — D-DIMER, QUANTITATIVE: D-Dimer, Quant: 0.54 ug/mL-FEU — ABNORMAL HIGH (ref 0.00–0.50)

## 2020-07-29 MED ORDER — FUROSEMIDE 10 MG/ML IJ SOLN
80.0000 mg | Freq: Every day | INTRAMUSCULAR | Status: DC
  Administered 2020-07-29 – 2020-07-30 (×2): 80 mg via INTRAVENOUS
  Filled 2020-07-29 (×2): qty 8

## 2020-07-29 MED ORDER — POTASSIUM CHLORIDE CRYS ER 20 MEQ PO TBCR
40.0000 meq | EXTENDED_RELEASE_TABLET | Freq: Once | ORAL | Status: AC
  Administered 2020-07-29: 40 meq via ORAL

## 2020-07-29 MED ORDER — INSULIN DETEMIR 100 UNIT/ML ~~LOC~~ SOLN
28.0000 [IU] | Freq: Two times a day (BID) | SUBCUTANEOUS | Status: DC
  Administered 2020-07-29 – 2020-07-30 (×2): 28 [IU] via SUBCUTANEOUS
  Filled 2020-07-29 (×2): qty 0.28

## 2020-07-29 NOTE — Progress Notes (Signed)
PROGRESS NOTE  Penny Huerta  ZOX:[id number] DOB: 1965-12-21 DOA: 07/26/2020 PCP: Patient, No Pcp Per Brief Narrative: Penny Huerta is a 54 y.o. female with a history of obesity, PAF, HLD, anxiety, and antigen-positive covid-19 infection diagnosed 11/9 s/p sotrovimab 11/10 who presented by EMS to the ED 11/12 with hypoxia and shortness of breath requiring nonrebreather initially, later transitioned to HFNC. CXR revealed cardiomegaly, vascular congestion, and multifocal infiltrates consistent with pulmonary edema mixed with covid-19 pneumonia. CRP 18.7, PCT negative. Lasix was given as well as remdesivir, decadron, baricitinib and hypoxia worsened requiring 15L HFNC.   Assessment & Plan: Principal Problem:   Acute hypoxemic respiratory failure due to COVID-19 [facility]) Active Problems:   Hyperlipidemia   Hyperglycemia  Acute hypoxemic respiratory failure due to covid-19 pneumonia and pulmonary edema: SARS-CoV-2 Ag positive on 11/9 s/p mAb 11/10.  - Continue remdesivir x5 days (11/12 - 11/16) - Continue solumedrol, CRP continues abrupt decline. - Continue baricitinib, started 11/12; planning 14 days Tx or can DC when stable for discharge. - Encourage OOB, IS, FV, and awake proning if able - Continue airborne, contact precautions for 21 days from positive testing. - Monitor CMP and inflammatory markers - Enoxaparin weight-based prophylactic dose.   Acute pulmonary edema, acute diastolic heart feailure: LVEF preserved with G1DD, plethoric IVC. - UOP fell off a bit on  IV lasix and Cr stable, will increase IV lasix. Weight essentially unchanged, though unable to get standing weight due to tenuous respiratory status. Repeat supplemental K as well. - Strict I/O, daily weights  T2DM: New diagnosis with HbA1c 7.6% with steroid-induced hyperglycemia (started PTA).  - Continue resistant SSI, HS correction. Increase levemir modestly this AM with elevated fasting CBG. Continue mealtime insulin,  though will need to hold this if eats < 50% of meal  PAF: Current NSR, rate controlled with in AFib.  - Continue telemetry for now.   Morbid obesity: Estimated body mass index is 48.93 kg/m as calculated from the following:   Height as of this encounter:  (1.676 m).   Weight as of this encounter: 137.5 kg.   HLD: LDL calculated to be 266, HDL 19, and triglycerides 811 (does not appear to be fasting study).  - Pt has listed allergy to statins. Consider repeat fasting panel, outpatient follow up/lipid clinic.  DVT prophylaxis: Lovenox 0.5mg /kg q24h Code Status: Full Family Communication: Husband by phone daily. Disposition Plan:  Status is: Inpatient  Remains inpatient appropriate because:Inpatient level of care appropriate due to severity of illness  Dispo: The patient is from: Home              Anticipated d/c is to: Home              Anticipated d/c date is: > 3 days              Patient currently is not medically stable to d/c.  Consultants:   None  Procedures:   None  Antimicrobials:  Remdesivir   Subjective: No significant improvement in shortness of breath, though perhaps marginally less fatigued. Eating fair, but not a ton. No chest pain. No leg swelling.   Objective: Vitals:   07/28/20 2135 07/29/20 0329 07/29/20 0353 07/29/20 0800  BP: (!) 160/84 133/65    Pulse: 66 71    Resp: (!) 22 20    Temp: 98.1 F (36.7 C) 98 F (36.7 C)    TempSrc: Oral Oral    SpO2: (!) 88% 90%  (!) 88%  Weight:   Marland Kitchen)  137.5 kg   Height:        Intake/Output Summary (Last 24 hours) at 07/29/2020 1115 Last data filed at 07/29/2020 0353 Gross per 24 hour  Intake 750 ml  Output 1300 ml  Net -550 ml   Filed Weights   07/26/20 1350 07/28/20 0500 07/29/20 0353  Weight: 136.1 kg (!) 136.3 kg (!) 137.5 kg   Gen: 54 y.o. female in no distress Pulm: Mildly labored at rest, tachypneic, Unable to rise for posterior exam, no crackles/wheezes anteriorly. CV: Regular rate and  rhythm. No murmur, rub, or gallop. UTD JVD, no pitting dependent edema. GI: Abdomen soft, non-tender, non-distended, with normoactive bowel sounds.  Ext: Warm, no deformities Skin: No rashes, lesions or ulcers on visualized skin. Neuro: Alert and oriented. No focal neurological deficits. Psych: Judgement and insight appear fair. Mood euthymic & affect congruent. Behavior is appropriate.    Data Reviewed: I have personally reviewed following labs and imaging studies  CBC: Recent Labs  Lab 07/26/20 1425 07/26/20 1728 07/27/20 0552 07/28/20 0509  WBC 9.8 8.4 9.6 14.7*  NEUTROABS 7.9*  --  7.2 12.0*  HGB 13.2 12.9 12.9 13.6  HCT 41.3 38.6 40.6 42.7  MCV 90.6 88.7 91.2 89.9  PLT 221 208 239 302   Basic Metabolic Panel: Recent Labs  Lab 07/26/20 1425 07/26/20 1728 07/27/20 0552 07/28/20 0509 07/29/20 0429  NA 135  --  142 140 140  K 4.0  --  3.9 3.5 3.5  CL 100  --  105 103 103  CO2 23  --  26 25 25   GLUCOSE 222*  --  240* 239* 196*  BUN 22*  --  27* 28* 31*  CREATININE 0.55 0.62 0.63 0.66 0.77  CALCIUM 8.6*  --  8.5* 8.3* 8.2*   GFR: Estimated Creatinine Clearance: 116.3 mL/min (by C-G formula based on SCr of 0.77 mg/dL). Liver Function Tests: Recent Labs  Lab 07/26/20 1425 07/27/20 0552 07/28/20 0509 07/29/20 0429  AST 51* 33 28 65*  ALT 83* 72* 56* 59*  ALKPHOS 54 57 54 53  BILITOT 0.8 0.7 0.8 1.0  PROT 7.7 7.5 7.3 7.0  ALBUMIN 3.3* 3.0* 3.0* 3.0*   HbA1C: Recent Labs    07/26/20 1728  HGBA1C 7.6*   CBG: Recent Labs  Lab 07/28/20 0737 07/28/20 1139 07/28/20 1642 07/28/20 2138 07/29/20 0845  GLUCAP 241* 269* 211* 145* 266*   Lipid Profile: Recent Labs    07/26/20 1425 07/26/20 1728  CHOL  --  357*  HDL  --  19*  LDLCALC  --  266*  TRIG 373* 359*  CHOLHDL  --  18.8   Thyroid Function Tests: No results for input(s): TSH, T4TOTAL, FREET4, T3FREE, THYROIDAB in the last 72 hours. Anemia Panel: Recent Labs    07/27/20 0552 07/28/20 0509    FERRITIN 652* 520*   Urine analysis:    Component Value Date/Time   BILIRUBINUR Neg 03/10/2013 1510   PROTEINUR Neg 03/10/2013 1510   UROBILINOGEN 0.2 03/10/2013 1510   NITRITE Neg 03/10/2013 1510   LEUKOCYTESUR Negative 03/10/2013 1510   Recent Results (from the past 240 hour(s))  Blood Culture (routine x 2)     Status: None (Preliminary result)   Collection Time: 07/26/20  2:25 PM   Specimen: Site Not Specified; Blood  Result Value Ref Range Status   Specimen Description   Final    SITE NOT SPECIFIED Performed at [facility], [address], [place], [place]  Special Requests   Final    BOTTLES DRAWN AEROBIC AND ANAEROBIC Blood Culture adequate volume Performed at [facility], [address] [place], [place] [address]    Culture   Final    NO GROWTH 2 DAYS Performed at [facility], [address], [place], [place] [address]    Report Status PENDING  Incomplete  Blood Culture (routine x 2)     Status: None (Preliminary result)   Collection Time: 07/26/20  2:25 PM   Specimen: Site Not Specified; Blood  Result Value Ref Range Status   Specimen Description   Final    SITE NOT SPECIFIED Performed at [facility], [address], [place], [place] [address]    Special Requests   Final    BOTTLES DRAWN AEROBIC AND ANAEROBIC Blood Culture results may not be optimal due to an inadequate volume of blood received in culture bottles Performed at [facility], [address] [place], [place] [address]    Culture   Final    NO GROWTH 2 DAYS Performed at [facility], [address], [place], [place] [address]    Report Status PENDING  Incomplete      Radiology Studies: ECHOCARDIOGRAM COMPLETE  Result Date: 07/27/2020    ECHOCARDIOGRAM REPORT   Patient Name:   SHERITA DECOSTE Date of Exam: 07/27/2020 Medical Rec #:  [id number]     Height:       66.0 in Accession #:     [id number]    Weight:       300.0 lb Date of Birth:  [birth year]     BSA:          2.376 m Patient Age:    54 years      BP:           160/81 mmHg Patient Gender: F             HR:           75 bpm. Exam Location:  Inpatient Procedure: 2D Echo, Cardiac Doppler and Color Doppler Indications:    I50.23 Acute on chronic systolic (congestive) heart failure  History:        Patient has prior history of Echocardiogram examinations, most                 recent 03/10/2011. Arrythmias:Atrial Fibrillation,                 Signs/Symptoms:Murmur; Risk Factors:Dyslipidemia. COVID-19                 Positive.  Sonographer:    Elmarie Shiley Dance Referring Phys: [id number] Tyrone Nine IMPRESSIONS  1. Left ventricular ejection fraction, by estimation, is 55 to 60%. The left ventricle has normal function. The left ventricle has no regional wall motion abnormalities. Left ventricular diastolic parameters are consistent with Grade I diastolic dysfunction (impaired relaxation).  2. Right ventricular systolic function is normal. The right ventricular size is normal.  3. The mitral valve is normal in structure. No evidence of mitral valve regurgitation. No evidence of mitral stenosis.  4. The aortic valve has an indeterminant number of cusps. Aortic valve regurgitation is not visualized. No aortic stenosis is present.  5. The inferior vena cava is dilated in size with >50% respiratory variability, suggesting right atrial pressure of 8 mmHg. FINDINGS  Left Ventricle: Left ventricular ejection fraction, by estimation, is 55 to 60%. The left ventricle has normal function. The  left ventricle has no regional wall motion abnormalities. The left ventricular internal cavity size was normal in size. There is  no left ventricular hypertrophy. Left ventricular diastolic parameters are consistent with Grade I diastolic dysfunction (impaired relaxation). Right Ventricle: The right ventricular size is normal. Right ventricular systolic function is normal. Left  Atrium: Left atrial size was normal in size. Right Atrium: Right atrial size was normal in size. Pericardium: There is no evidence of pericardial effusion. Mitral Valve: The mitral valve is normal in structure. Mild mitral annular calcification. No evidence of mitral valve regurgitation. No evidence of mitral valve stenosis. Tricuspid Valve: The tricuspid valve is normal in structure. Tricuspid valve regurgitation is trivial. No evidence of tricuspid stenosis. Aortic Valve: The aortic valve has an indeterminant number of cusps. Aortic valve regurgitation is not visualized. No aortic stenosis is present. Pulmonic Valve: The pulmonic valve was not well visualized. Pulmonic valve regurgitation is not visualized. No evidence of pulmonic stenosis. Aorta: The aortic root is normal in size and structure. Venous: The inferior vena cava is dilated in size with greater than 50% respiratory variability, suggesting right atrial pressure of 8 mmHg. IAS/Shunts: No atrial level shunt detected by color flow Doppler.  LEFT VENTRICLE PLAX 2D LVIDd:         5.30 cm  Diastology LVIDs:         3.40 cm  LV e' medial:    7.29 cm/s LV PW:         1.10 cm  LV E/e' medial:  15.4 LV IVS:        1.10 cm  LV e' lateral:   5.00 cm/s LVOT diam:     1.80 cm  LV E/e' lateral: 22.4 LV SV:         53 LV SV Index:   22 LVOT Area:     2.54 cm  RIGHT VENTRICLE             IVC RV Basal diam:  2.20 cm     IVC diam: 2.50 cm RV S prime:     17.30 cm/s TAPSE (M-mode): 1.8 cm LEFT ATRIUM             Index       RIGHT ATRIUM           Index LA diam:        4.00 cm 1.68 cm/m  RA Area:     19.70 cm LA Vol (A2C):   72.6 ml 30.56 ml/m RA Volume:   57.90 ml  24.37 ml/m LA Vol (A4C):   54.7 ml 23.02 ml/m LA Biplane Vol: 65.7 ml 27.65 ml/m  AORTIC VALVE LVOT Vmax:   94.80 cm/s LVOT Vmean:  69.000 cm/s LVOT VTI:    0.210 m  AORTA Ao Root diam: 3.00 cm Ao Asc diam:  3.00 cm MITRAL VALVE MV Area (PHT): 2.76 cm     SHUNTS MV Decel Time: 275 msec     Systemic VTI:   0.21 m MV E velocity: 112.00 cm/s  Systemic Diam: 1.80 cm MV A velocity: 109.00 cm/s MV E/A ratio:  1.03 Olga MillersBrian Crenshaw MD Electronically signed by Olga MillersBrian Crenshaw MD Signature Date/Time: 07/27/2020/3:00:21 PM    Final     Scheduled Meds:  baricitinib  4 mg Oral Daily   enoxaparin (LOVENOX) injection  70 mg Subcutaneous Q24H   furosemide  80 mg Intravenous Daily   insulin aspart  0-20 Units Subcutaneous TID WC   insulin aspart  0-5 Units Subcutaneous  QHS   insulin aspart  4 Units Subcutaneous TID WC   insulin detemir  25 Units Subcutaneous BID   Ipratropium-Albuterol  1 puff Inhalation Q6H   linagliptin  5 mg Oral Daily   methylPREDNISolone (SOLU-MEDROL) injection  80 mg Intravenous Q12H   sodium chloride flush  3 mL Intravenous Q12H   Continuous Infusions:  sodium chloride     remdesivir 100 mg in NS 100 mL 100 mg (07/29/20 0949)     LOS: 3 days   Time spent: 35 minutes.  Tyrone Nine, MD [facility] [email or web address] 07/29/2020, 11:15 AM

## 2020-07-30 DIAGNOSIS — U071 COVID-19: Secondary | ICD-10-CM | POA: Diagnosis not present

## 2020-07-30 DIAGNOSIS — R739 Hyperglycemia, unspecified: Secondary | ICD-10-CM | POA: Diagnosis not present

## 2020-07-30 DIAGNOSIS — I480 Paroxysmal atrial fibrillation: Secondary | ICD-10-CM | POA: Diagnosis not present

## 2020-07-30 DIAGNOSIS — E785 Hyperlipidemia, unspecified: Secondary | ICD-10-CM | POA: Diagnosis not present

## 2020-07-30 LAB — COMPREHENSIVE METABOLIC PANEL
ALT: 62 U/L — ABNORMAL HIGH (ref 0–44)
AST: 44 U/L — ABNORMAL HIGH (ref 15–41)
Albumin: 3.2 g/dL — ABNORMAL LOW (ref 3.5–5.0)
Alkaline Phosphatase: 48 U/L (ref 38–126)
Anion gap: 12 (ref 5–15)
BUN: 30 mg/dL — ABNORMAL HIGH (ref 6–20)
CO2: 25 mmol/L (ref 22–32)
Calcium: 8.4 mg/dL — ABNORMAL LOW (ref 8.9–10.3)
Chloride: 101 mmol/L (ref 98–111)
Creatinine, Ser: 0.63 mg/dL (ref 0.44–1.00)
GFR, Estimated: >60 mL/min (ref >60)
Glucose, Bld: 180 mg/dL — ABNORMAL HIGH (ref 70–99)
Potassium: 3.5 mmol/L (ref 3.5–5.1)
Sodium: 138 mmol/L (ref 135–145)
Total Bilirubin: 1 mg/dL (ref 0.3–1.2)
Total Protein: 7.6 g/dL (ref 6.5–8.1)

## 2020-07-30 LAB — GLUCOSE, CAPILLARY
Glucose-Capillary: 240 mg/dL — ABNORMAL HIGH (ref 70–99)
Glucose-Capillary: 331 mg/dL — ABNORMAL HIGH (ref 70–99)
Glucose-Capillary: 211 mg/dL — ABNORMAL HIGH (ref 70–99)
Glucose-Capillary: 206 mg/dL — ABNORMAL HIGH (ref 70–99)

## 2020-07-30 LAB — D-DIMER, QUANTITATIVE: D-Dimer, Quant: 0.33 ug/mL-FEU (ref 0.00–0.50)

## 2020-07-30 LAB — C-REACTIVE PROTEIN: CRP: 2.5 mg/dL — ABNORMAL HIGH (ref <1.0)

## 2020-07-30 LAB — BRAIN NATRIURETIC PEPTIDE: B Natriuretic Peptide: 30.2 pg/mL (ref 0.0–100.0)

## 2020-07-30 MED ORDER — INSULIN ASPART 100 UNIT/ML ~~LOC~~ SOLN
8.0000 [IU] | Freq: Three times a day (TID) | SUBCUTANEOUS | Status: DC
  Administered 2020-07-30 – 2020-07-31 (×4): 8 [IU] via SUBCUTANEOUS

## 2020-07-30 MED ORDER — POTASSIUM CHLORIDE CRYS ER 20 MEQ PO TBCR
40.0000 meq | EXTENDED_RELEASE_TABLET | Freq: Every day | ORAL | Status: DC
  Administered 2020-07-30 – 2020-08-04 (×7): 40 meq via ORAL
  Filled 2020-07-30 (×6): qty 2

## 2020-07-30 MED ORDER — SENNOSIDES-DOCUSATE SODIUM 8.6-50 MG PO TABS
1.0000 | ORAL_TABLET | Freq: Every evening | ORAL | Status: DC | PRN
  Filled 2020-07-30: qty 1

## 2020-07-30 MED ORDER — INSULIN ASPART 100 UNIT/ML ~~LOC~~ SOLN
6.0000 [IU] | Freq: Three times a day (TID) | SUBCUTANEOUS | Status: DC
  Administered 2020-07-30 (×2): 6 [IU] via SUBCUTANEOUS

## 2020-07-30 MED ORDER — INSULIN DETEMIR 100 UNIT/ML ~~LOC~~ SOLN
30.0000 [IU] | Freq: Two times a day (BID) | SUBCUTANEOUS | Status: DC
  Administered 2020-07-30 – 2020-07-31 (×2): 30 [IU] via SUBCUTANEOUS
  Filled 2020-07-30 (×2): qty 0.3

## 2020-07-30 MED ORDER — FUROSEMIDE 10 MG/ML IJ SOLN
80.0000 mg | Freq: Two times a day (BID) | INTRAMUSCULAR | Status: DC
  Administered 2020-07-30 – 2020-08-04 (×11): 80 mg via INTRAVENOUS
  Filled 2020-07-30 (×11): qty 8

## 2020-07-30 MED ORDER — LIVING WELL WITH DIABETES BOOK
Freq: Once | Status: AC
  Filled 2020-07-30: qty 1

## 2020-07-30 MED ORDER — POLYETHYLENE GLYCOL 3350 17 G PO PACK: 17.0000 g | PACK | Freq: Every day | ORAL | Status: DC | PRN

## 2020-07-30 NOTE — Progress Notes (Signed)
PROGRESS NOTE  Penny Huerta  TIW:[id number] DOB: [birth year] DOA: 07/26/2020 PCP: Patient, No Pcp Per Brief Narrative: Penny Huerta is a 54 y.o. female with a history of obesity, PAF, HLD, anxiety, and antigen-positive covid-19 infection diagnosed 11/9 s/p sotrovimab 11/10 who presented by EMS to the ED 11/12 with hypoxia and shortness of breath requiring nonrebreather initially, later transitioned to HFNC. CXR revealed cardiomegaly, vascular congestion, and multifocal infiltrates consistent with pulmonary edema mixed with covid-19 pneumonia. CRP 18.7, PCT negative. Lasix was given as well as remdesivir, decadron, baricitinib and hypoxia worsened requiring 15L HFNC.   Assessment & Plan: Principal Problem:   Acute hypoxemic respiratory failure due to COVID-19 [facility]) Active Problems:   Hyperlipidemia   Hyperglycemia  Acute hypoxemic respiratory failure due to covid-19 pneumonia and pulmonary edema: SARS-CoV-2 Ag positive on 11/9 s/p mAb 11/10.  - Complete remdesivir x5 days (11/12 - 11/16) - Continue solumedrol, CRP stable today, recheck in AM - Continue baricitinib, started 11/12; planning 14 days Tx or can DC when stable for discharge. - Encourage OOB, IS, FV, and awake proning if able - Continue airborne, contact precautions for 21 days from positive testing. - Monitor CMP and inflammatory markers - Enoxaparin weight-based prophylactic dose.   Acute pulmonary edema, acute diastolic heart feailure: LVEF preserved with G1DD, plethoric IVC. BNP 30 (possibly falsely low due to habitus) - Augment IV lasix 80mg  IV BID. Only modest UOP, weight stable. BUN:Cr continues widening with stable creatinine. Continue supplemental K (standing order) - Strict I/O, daily weights  T2DM: New diagnosis with HbA1c 7.6% with steroid-induced hyperglycemia (steroid started PTA).  - Continue resistant SSI, HS correction. Increased levemir yesterday PM with improved fasting CBG. Postprandials elevated, so increase  mealtime insulin and dose adjust as indicated. - On linagliptin.  PAF: Current NSR, rate controlled with in AFib.  - Continue telemetry for now.   Morbid obesity: Estimated body mass index is 48.89 kg/m as calculated from the following:   Height as of this encounter: 5\' 6"  (1.676 m).   Weight as of this encounter: 137.4 kg.   HLD: LDL calculated to be 266, HDL 19, and triglycerides (does not appear to be fasting study).  - Pt has listed allergy to statins. Consider repeat fasting panel, outpatient follow up/lipid clinic.  DVT prophylaxis: Lovenox 0.5mg /kg q24h Code Status: Full Family Communication: Husband by phone daily. Disposition Plan:  Status is: Inpatient  Remains inpatient appropriate because:Inpatient level of care appropriate due to severity of illness  Dispo: The patient is from: Home              Anticipated d/c is to: Home              Anticipated d/c date is: > 3 days              Patient currently is not medically stable to d/c.  Consultants:   None  Procedures:   None  Antimicrobials:  Remdesivir   Subjective: Feels like crap. Shortness of breath remains moderate-severe at rest. Has not gotten OOB yet, but is willing to try (d/w RN at bedside as well) today. No chest pain or leg swelling. Eating ok.   Objective: Vitals:   07/30/20 0333 07/30/20 0500 07/30/20 0629 07/30/20 0806  BP:   (!) 151/83   Pulse: 76  72 (!) 56  Resp:   18 (!) 23  Temp:   97.9 F (36.6 C)   TempSrc:   Oral   SpO2: (!) 89%  91% 93%  Weight:  (!) 137.4 kg    Height:        Intake/Output Summary (Last 24 hours) at 07/30/2020 1158 Last data filed at 07/30/2020 0957 Gross per 24 hour  Intake 1143 ml  Output 1350 ml  Net -207 ml   Filed Weights   07/28/20 0500 07/29/20 0353 07/30/20 0500  Weight: (!) 136.3 kg (!) 137.5 kg (!) 137.4 kg   Gen: 54 y.o. female in no distress Pulm: Nonlabored tachypnea at rest with crackles laterally. CV: Regular rate and rhythm. No  murmur, rub, or gallop. No JVD, no significant dependent edema. GI: Abdomen soft, non-tender, non-distended, with normoactive bowel sounds.  Ext: Warm, no deformities Skin: No rashes, lesions or ulcers on visualized skin. Neuro: Alert and oriented. No focal neurological deficits. Psych: Judgement and insight appear fair. Mood euthymic & affect congruent. Behavior is appropriate.   Data Reviewed: I have personally reviewed following labs and imaging studies  CBC: Recent Labs  Lab 07/26/20 1425 07/26/20 1728 07/27/20 0552 07/28/20 0509  WBC 9.8 8.4 9.6 14.7*  NEUTROABS 7.9*  --  7.2 12.0*  HGB 13.2 12.9 12.9 13.6  HCT 41.3 38.6 40.6 42.7  MCV 90.6 88.7 91.2 89.9  PLT 221 208 239 302   Basic Metabolic Panel: Recent Labs  Lab 07/26/20 1425 07/26/20 1425 07/26/20 1728 07/27/20 0552 07/28/20 0509 07/29/20 0429 07/30/20 0451  NA 135  --   --  142 140 140 138  K 4.0  --   --  3.9 3.5 3.5 3.5  CL 100  --   --  105 103 103 101  CO2 23  --   --  26 25 25 25   GLUCOSE 222*  --   --  240* 239* 196* 180*  BUN 22*  --   --  27* 28* 31* 30*  CREATININE 0.55   < > 0.62 0.63 0.66 0.77 0.63  CALCIUM 8.6*  --   --  8.5* 8.3* 8.2* 8.4*   < > = values in this interval not displayed.   GFR: Estimated Creatinine Clearance: 116.2 mL/min (by C-G formula based on SCr of 0.63 mg/dL). Liver Function Tests: Recent Labs  Lab 07/26/20 1425 07/27/20 0552 07/28/20 0509 07/29/20 0429 07/30/20 0451  AST 51* 33 28 65* 44*  ALT 83* 72* 56* 59* 62*  ALKPHOS 54 57 54 53 48  BILITOT 0.8 0.7 0.8 1.0 1.0  PROT 7.7 7.5 7.3 7.0 7.6  ALBUMIN 3.3* 3.0* 3.0* 3.0* 3.2*   HbA1C: No results for input(s): HGBA1C in the last 72 hours. CBG: Recent Labs  Lab 07/29/20 1516 07/29/20 1741 07/29/20 2111 07/30/20 0845 07/30/20 1155  GLUCAP 216* 193* 201* 240* 331*   Lipid Profile: No results for input(s): CHOL, HDL, LDLCALC, TRIG, CHOLHDL, LDLDIRECT in the last 72 hours. Thyroid Function Tests: No  results for input(s): TSH, T4TOTAL, FREET4, T3FREE, THYROIDAB in the last 72 hours. Anemia Panel: Recent Labs    07/28/20 0509  FERRITIN 520*   Urine analysis:    Component Value Date/Time   BILIRUBINUR Neg 03/10/2013 1510   PROTEINUR Neg 03/10/2013 1510   UROBILINOGEN 0.2 03/10/2013 1510   NITRITE Neg 03/10/2013 1510   LEUKOCYTESUR Negative 03/10/2013 1510   Recent Results (from the past 240 hour(s))  Blood Culture (routine x 2)     Status: None (Preliminary result)   Collection Time: 07/26/20  2:25 PM   Specimen: Site Not Specified; Blood  Result Value Ref Range Status   Specimen  Description   Final    SITE NOT SPECIFIED Performed at [facility], [address], [place], [place] [address]    Special Requests   Final    BOTTLES DRAWN AEROBIC AND ANAEROBIC Blood Culture adequate volume Performed at [facility], [address], [place], [place] [address]    Culture   Final    NO GROWTH 3 DAYS Performed at [facility], [address] [place], [place] [address]    Report Status PENDING  Incomplete  Blood Culture (routine x 2)     Status: None (Preliminary result)   Collection Time: 07/26/20  2:25 PM   Specimen: Site Not Specified; Blood  Result Value Ref Range Status   Specimen Description   Final    SITE NOT SPECIFIED Performed at [facility], [address], [place], [place] [address]    Special Requests   Final    BOTTLES DRAWN AEROBIC AND ANAEROBIC Blood Culture results may not be optimal due to an inadequate volume of blood received in culture bottles Performed at [facility], [address], [place], [place] [address]    Culture   Final    NO GROWTH 3 DAYS Performed at [facility], [address] [place], [place] [address]    Report Status PENDING  Incomplete      Radiology Studies: No results found.  Scheduled Meds:  baricitinib  4 mg Oral Daily   enoxaparin  (LOVENOX) injection  70 mg Subcutaneous Q24H   furosemide  80 mg Intravenous Daily   insulin aspart  0-20 Units Subcutaneous TID WC   insulin aspart  0-5 Units Subcutaneous QHS   insulin aspart  6 Units Subcutaneous TID WC   insulin detemir  28 Units Subcutaneous BID   Ipratropium-Albuterol  1 puff Inhalation Q6H   linagliptin  5 mg Oral Daily   living well with diabetes book   Does not apply Once   methylPREDNISolone (SOLU-MEDROL) injection  80 mg Intravenous Q12H   potassium chloride  40 mEq Oral Daily   sodium chloride flush  3 mL Intravenous Q12H   Continuous Infusions:  sodium chloride       LOS: 4 days   Time spent: 35 minutes.  Tyrone Nine, MD [facility] [email or web address] 07/30/2020, 11:58 AM

## 2020-07-30 NOTE — Plan of Care (Signed)
  Problem: Education: Goal: Knowledge of risk factors and measures for prevention of condition will improve Outcome: Progressing   Problem: Coping: Goal: Psychosocial and spiritual needs will be supported Outcome: Progressing   Problem: Respiratory: Goal: Will maintain a patent airway Outcome: Progressing Goal: Complications related to the disease process, condition or treatment will be avoided or minimized Outcome: Progressing   

## 2020-07-30 NOTE — TOC Progression Note (Signed)
Transition of Care [facility]) - Progression Note    Patient Details  Name: Penny Huerta MRN: [id number] Date of Birth: 1965-09-25  Transition of Care [facility]) CM/SW Contact  Geni Bers, RN Phone Number: 07/30/2020, 2:28 PM  Clinical Narrative:     Pt from home with spouse and plan to return home. TOC will continue to follow.  Expected Discharge Plan: Home/Self Care Barriers to Discharge: No Barriers Identified  Expected Discharge Plan and Services Expected Discharge Plan: Home/Self Care       Living arrangements for the past 2 months: Single Family Home                                       Social Determinants of Health (SDOH) Interventions    Readmission Risk Interventions No flowsheet data found.

## 2020-07-30 NOTE — Progress Notes (Signed)
Inpatient Diabetes Program Recommendations  AACE/ADA: New Consensus Statement on Inpatient Glycemic Control (2015)  Target Ranges:  Prepandial:   less than 140 mg/dL      Peak postprandial:   less than 180 mg/dL (1-2 hours)      Critically ill patients:  140 - 180 mg/dL   Lab Results  Component Value Date   GLUCAP 240 (H) 07/30/2020   HGBA1C 7.6 (H) 07/26/2020    Review of Glycemic Control  Diabetes history: New-onset DM Outpatient Diabetes medications: None Current orders for Inpatient glycemic control: Levemir 28 units bid, Novolog 0-20 units tidwc and hs + 6 units tidwc, tradjenta 5 mg QD  On Solumedrol 80 mg Q12H. HgbA1C - 7.6%. Steroid-induced hyperglycemia  Inpatient Diabetes Program Recommendations:     Prandial insulin increased to 6 units tid today. Will likely need 8-10 units tid.  Will speak with pt about new diagnosis, with emphasis on lifestyle modifications including weight loss, exercise and healthy diet. Stress importance of making changes to reduce risk of diabetes complications in the future.  Continue to follow closely.   Thank you. Ailene Ards, RD, LDN, CDE Inpatient Diabetes Coordinator [phone]

## 2020-07-31 DIAGNOSIS — I4891 Unspecified atrial fibrillation: Secondary | ICD-10-CM | POA: Diagnosis not present

## 2020-07-31 DIAGNOSIS — J9601 Acute respiratory failure with hypoxia: Secondary | ICD-10-CM | POA: Diagnosis not present

## 2020-07-31 DIAGNOSIS — R739 Hyperglycemia, unspecified: Secondary | ICD-10-CM | POA: Diagnosis not present

## 2020-07-31 DIAGNOSIS — U071 COVID-19: Secondary | ICD-10-CM | POA: Diagnosis not present

## 2020-07-31 LAB — COMPREHENSIVE METABOLIC PANEL
ALT: 68 U/L — ABNORMAL HIGH (ref 0–44)
AST: 62 U/L — ABNORMAL HIGH (ref 15–41)
Albumin: 3.4 g/dL — ABNORMAL LOW (ref 3.5–5.0)
Alkaline Phosphatase: 56 U/L (ref 38–126)
Anion gap: 12 (ref 5–15)
BUN: 35 mg/dL — ABNORMAL HIGH (ref 6–20)
CO2: 26 mmol/L (ref 22–32)
Calcium: 8.6 mg/dL — ABNORMAL LOW (ref 8.9–10.3)
Chloride: 96 mmol/L — ABNORMAL LOW (ref 98–111)
Creatinine, Ser: 0.68 mg/dL (ref 0.44–1.00)
GFR, Estimated: >60 mL/min (ref >60)
Glucose, Bld: 206 mg/dL — ABNORMAL HIGH (ref 70–99)
Potassium: 3.9 mmol/L (ref 3.5–5.1)
Sodium: 134 mmol/L — ABNORMAL LOW (ref 135–145)
Total Bilirubin: 1.6 mg/dL — ABNORMAL HIGH (ref 0.3–1.2)
Total Protein: 8.1 g/dL (ref 6.5–8.1)

## 2020-07-31 LAB — CULTURE, BLOOD (ROUTINE X 2)
Culture: NO GROWTH
Culture: NO GROWTH
Special Requests: ADEQUATE

## 2020-07-31 LAB — GLUCOSE, CAPILLARY
Glucose-Capillary: 242 mg/dL — ABNORMAL HIGH (ref 70–99)
Glucose-Capillary: 293 mg/dL — ABNORMAL HIGH (ref 70–99)
Glucose-Capillary: 276 mg/dL — ABNORMAL HIGH (ref 70–99)
Glucose-Capillary: 274 mg/dL — ABNORMAL HIGH (ref 70–99)

## 2020-07-31 LAB — D-DIMER, QUANTITATIVE: D-Dimer, Quant: 0.36 ug/mL-FEU (ref 0.00–0.50)

## 2020-07-31 LAB — C-REACTIVE PROTEIN: CRP: 1.4 mg/dL — ABNORMAL HIGH (ref <1.0)

## 2020-07-31 MED ORDER — SALINE SPRAY 0.65 % NA SOLN
1.0000 | NASAL | Status: DC | PRN
  Administered 2020-07-31: 1 via NASAL
  Filled 2020-07-31: qty 44

## 2020-07-31 MED ORDER — INSULIN DETEMIR 100 UNIT/ML ~~LOC~~ SOLN
45.0000 [IU] | Freq: Two times a day (BID) | SUBCUTANEOUS | Status: DC
  Administered 2020-07-31 – 2020-08-01 (×3): 45 [IU] via SUBCUTANEOUS
  Filled 2020-07-31 (×4): qty 0.45

## 2020-07-31 MED ORDER — ALBUTEROL SULFATE (2.5 MG/3ML) 0.083% IN NEBU: 2.5000 mg | INHALATION_SOLUTION | RESPIRATORY_TRACT | Status: DC | PRN

## 2020-07-31 NOTE — Progress Notes (Signed)
PT Cancellation Note  Patient Details Name: Penny Huerta MRN: [id number] DOB: Aug 10, 1966   Cancelled Treatment:    Reason Eval/Treat Not Completed: Fatigue limiting ability to participate, pt. Reports  That she was in recliner several hours, now in bed. Will check back tomorrow.   Rada Hay 07/31/2020, 4:27 PM  Blanchard Kelch PT Acute Rehabilitation Services Pager [phone] Office [phone]

## 2020-07-31 NOTE — Plan of Care (Signed)
  Problem: Education: Goal: Knowledge of risk factors and measures for prevention of condition will improve Outcome: Progressing   Problem: Coping: Goal: Psychosocial and spiritual needs will be supported Outcome: Progressing   Problem: Respiratory: Goal: Will maintain a patent airway Outcome: Progressing Goal: Complications related to the disease process, condition or treatment will be avoided or minimized Outcome: Progressing   

## 2020-07-31 NOTE — Progress Notes (Signed)
[facility] PROGRESS NOTE    Progress Note  TENNA LACKO  YBO:[id number] DOB: 1965-10-15 DOA: 07/26/2020 PCP: Patient, No Pcp Per     Brief Narrative:   BENTLY MORATH is an 54 y.o. female past medical history of obesity paroxysmal atrial fibrillation antigen positive COVID-19 infection diagnosed 08/02/2020 status post treatment who presents on 07/26/2020 with hypoxia and shortness of breath chest x-ray showed bilateral infiltrates with a SARS-CoV-2 PCR positive CRP of 18.  Assessment/Plan:   Acute hypoxemic respiratory failure due to COVID-19 [facility]) She has completed her course of IV remdesivir, currently on steroids and baricitinib, will plan for 10-day course of steroids and 14 days of baricitinib or when she is discharged he can be DC'd. Encourage out of bed to chair proning position. Lovenox for DVT prophylaxis.  Acute pulmonary edema/due to acute diastolic heart failure: She was started on IV Lasix twice a day with moderate urine output. He is negative about 3.3 L, blood pressure has remained stable as well as her creatinine.  We will restrict her fluids and continue strict I's and O's and diuresis.  Insulin-dependent diabetes mellitus type 2: With an A1c of 7.6 she is hyperglycemic due to steroids. She was started on long-acting insulin plus sliding scale, increase long-acting insulin blood glucose continues to be erratic.  Paroxysmal atrial fibrillation: Currently rate controlled in sinus rhythm.  Morbid obesity: Noted  DVT prophylaxis: lovenox Family Communication:none Status is: Inpatient  Remains inpatient appropriate because:Hemodynamically unstable   Dispo: The patient is from: Home              Anticipated d/c is to: Home              Anticipated d/c date is: > 3 days              Patient currently is not medically stable to d/c.   Code Status:     Code Status Orders  (From admission, onward)         Start     Ordered   07/26/20 1636  Full  code  Continuous        07/26/20 1635        Code Status History    This patient has a current code status but no historical code status.   Advance Care Planning Activity        IV Access:    Peripheral IV   Procedures and diagnostic studies:   No results found.   Medical Consultants:    None.  Anti-Infectives:   Baricitinib  Subjective:    Ivory Broad Mohon she relates her breathing is unchanged compared to yesterday.  Objective:    Vitals:   07/31/20 0436 07/31/20 0500 07/31/20 0900 07/31/20 1210  BP: 135/82   129/89  Pulse: 67   70  Resp: 20   16  Temp: 98 F (36.7 C)   97.7 F (36.5 C)  TempSrc: Oral   Oral  SpO2: 97%  93% 95%  Weight:  (!) 137.4 kg    Height:       SpO2: 95 % O2 Flow Rate (L/min): 14 L/min FiO2 (%): 100 %   Intake/Output Summary (Last 24 hours) at 07/31/2020 1218 Last data filed at 07/31/2020 1108 Gross per 24 hour  Intake 1200 ml  Output 3600 ml  Net -2400 ml   Filed Weights   07/29/20 0353 07/30/20 0500 07/31/20 0500  Weight: (!) 137.5 kg (!) 137.4 kg (!) 137.4 kg  Exam: General exam: In no acute distress. Respiratory system: Good air movement and clear to auscultation. Cardiovascular system: S1 & S2 heard, RRR. No JVD. Gastrointestinal system: Abdomen is nondistended, soft and nontender.  Extremities: No pedal edema. Skin: No rashes, lesions or ulcers Psychiatry: Judgement and insight appear normal. Mood & affect appropriate.    Data Reviewed:    Labs: Basic Metabolic Panel: Recent Labs  Lab 07/27/20 0552 07/27/20 0552 07/28/20 0509 07/28/20 0509 07/29/20 0429 07/29/20 0429 07/30/20 0451 07/31/20 0441  NA 142  --  140  --  140  --  138 134*  K 3.9   < > 3.5   < > 3.5   < > 3.5 3.9  CL 105  --  103  --  103  --  101 96*  CO2 26  --  25  --  25  --  25 26  GLUCOSE 240*  --  239*  --  196*  --  180* 206*  BUN 27*  --  28*  --  31*  --  30* 35*  CREATININE 0.63  --  0.66  --  0.77  --  0.63 0.68    CALCIUM 8.5*  --  8.3*  --  8.2*  --  8.4* 8.6*   < > = values in this interval not displayed.   GFR Estimated Creatinine Clearance: 116.2 mL/min (by C-G formula based on SCr of 0.68 mg/dL). Liver Function Tests: Recent Labs  Lab 07/27/20 0552 07/28/20 0509 07/29/20 0429 07/30/20 0451 07/31/20 0441  AST 33 28 65* 44* 62*  ALT 72* 56* 59* 62* 68*  ALKPHOS 57 54 53 48 56  BILITOT 0.7 0.8 1.0 1.0 1.6*  PROT 7.5 7.3 7.0 7.6 8.1  ALBUMIN 3.0* 3.0* 3.0* 3.2* 3.4*   No results for input(s): LIPASE, AMYLASE in the last 168 hours. No results for input(s): AMMONIA in the last 168 hours. Coagulation profile No results for input(s): INR, PROTIME in the last 168 hours. COVID-19 Labs  Recent Labs    07/29/20 0429 07/30/20 0451 07/31/20 0441  DDIMER 0.54* 0.33 0.36  CRP 2.2* 2.5* 1.4*    No results found for: SARSCOV2NAA  CBC: Recent Labs  Lab 07/26/20 1425 07/26/20 1728 07/27/20 0552 07/28/20 0509  WBC 9.8 8.4 9.6 14.7*  NEUTROABS 7.9*  --  7.2 12.0*  HGB 13.2 12.9 12.9 13.6  HCT 41.3 38.6 40.6 42.7  MCV 90.6 88.7 91.2 89.9  PLT 221 208 239 302   Cardiac Enzymes: No results for input(s): CKTOTAL, CKMB, CKMBINDEX, TROPONINI in the last 168 hours. BNP (last 3 results) No results for input(s): PROBNP in the last 8760 hours. CBG: Recent Labs  Lab 07/30/20 1155 07/30/20 1621 07/30/20 2136 07/31/20 0748 07/31/20 1206  GLUCAP 331* 211* 206* 242* 293*   D-Dimer: Recent Labs    07/30/20 0451 07/31/20 0441  DDIMER 0.33 0.36   Hgb A1c: No results for input(s): HGBA1C in the last 72 hours. Lipid Profile: No results for input(s): CHOL, HDL, LDLCALC, TRIG, CHOLHDL, LDLDIRECT in the last 72 hours. Thyroid function studies: No results for input(s): TSH, T4TOTAL, T3FREE, THYROIDAB in the last 72 hours.  Invalid input(s): FREET3 Anemia work up: No results for input(s): VITAMINB12, FOLATE, FERRITIN, TIBC, IRON, RETICCTPCT in the last 72 hours. Sepsis Labs: Recent  Labs  Lab 07/26/20 1425 07/26/20 1728 07/26/20 1730 07/27/20 0552 07/28/20 0509  PROCALCITON <0.10  --   --   --   --   WBC 9.8 8.4  --  9.6 14.7*  LATICACIDVEN 1.4  --  1.2  --   --    Microbiology Recent Results (from the past 240 hour(s))  Blood Culture (routine x 2)     Status: None (Preliminary result)   Collection Time: 07/26/20  2:25 PM   Specimen: Site Not Specified; Blood  Result Value Ref Range Status   Specimen Description   Final    SITE NOT SPECIFIED Performed at [facility], [address], [place], [place] [address]    Special Requests   Final    BOTTLES DRAWN AEROBIC AND ANAEROBIC Blood Culture adequate volume Performed at [facility] [address] [place], [place] [address]    Culture   Final    NO GROWTH 4 DAYS Performed at [facility], [address] [place], [place] [address]    Report Status PENDING  Incomplete  Blood Culture (routine x 2)     Status: None (Preliminary result)   Collection Time: 07/26/20  2:25 PM   Specimen: Site Not Specified; Blood  Result Value Ref Range Status   Specimen Description   Final    SITE NOT SPECIFIED Performed at [facility], [address], [place], [place] [address]    Special Requests   Final    BOTTLES DRAWN AEROBIC AND ANAEROBIC Blood Culture results may not be optimal due to an inadequate volume of blood received in culture bottles Performed at [facility], [address] [place], [place] [address]    Culture   Final    NO GROWTH 4 DAYS Performed at [facility], [address], [place], [place] [address]    Report Status PENDING  Incomplete     Medications:   . baricitinib  4 mg Oral Daily  . enoxaparin (LOVENOX) injection  70 mg Subcutaneous Q24H  . furosemide  80 mg Intravenous BID  . insulin aspart  0-20 Units Subcutaneous TID WC  . insulin aspart  0-5 Units Subcutaneous QHS  . insulin aspart  8 Units  Subcutaneous TID WC  . insulin detemir  30 Units Subcutaneous BID  . linagliptin  5 mg Oral Daily  . methylPREDNISolone (SOLU-MEDROL) injection  80 mg Intravenous Q12H  . potassium chloride  40 mEq Oral Daily  . sodium chloride flush  3 mL Intravenous Q12H   Continuous Infusions: . sodium chloride        LOS: 5 days   Marinda Elk  [facility]  07/31/2020, 12:18 PM

## 2020-08-01 DIAGNOSIS — U071 COVID-19: Secondary | ICD-10-CM | POA: Diagnosis not present

## 2020-08-01 DIAGNOSIS — J9601 Acute respiratory failure with hypoxia: Secondary | ICD-10-CM | POA: Diagnosis not present

## 2020-08-01 DIAGNOSIS — R739 Hyperglycemia, unspecified: Secondary | ICD-10-CM | POA: Diagnosis not present

## 2020-08-01 DIAGNOSIS — I4891 Unspecified atrial fibrillation: Secondary | ICD-10-CM | POA: Diagnosis not present

## 2020-08-01 LAB — BASIC METABOLIC PANEL
Anion gap: 16 — ABNORMAL HIGH (ref 5–15)
BUN: 42 mg/dL — ABNORMAL HIGH (ref 6–20)
CO2: 27 mmol/L (ref 22–32)
Calcium: 8.7 mg/dL — ABNORMAL LOW (ref 8.9–10.3)
Chloride: 89 mmol/L — ABNORMAL LOW (ref 98–111)
Creatinine, Ser: 0.72 mg/dL (ref 0.44–1.00)
GFR, Estimated: >60 mL/min (ref >60)
Glucose, Bld: 238 mg/dL — ABNORMAL HIGH (ref 70–99)
Potassium: 3.4 mmol/L — ABNORMAL LOW (ref 3.5–5.1)
Sodium: 132 mmol/L — ABNORMAL LOW (ref 135–145)

## 2020-08-01 LAB — GLUCOSE, CAPILLARY
Glucose-Capillary: 238 mg/dL — ABNORMAL HIGH (ref 70–99)
Glucose-Capillary: 215 mg/dL — ABNORMAL HIGH (ref 70–99)
Glucose-Capillary: 248 mg/dL — ABNORMAL HIGH (ref 70–99)

## 2020-08-01 LAB — C-REACTIVE PROTEIN: CRP: 1 mg/dL — ABNORMAL HIGH (ref <1.0)

## 2020-08-01 LAB — D-DIMER, QUANTITATIVE: D-Dimer, Quant: 0.35 ug/mL-FEU (ref 0.00–0.50)

## 2020-08-01 MED ORDER — INSULIN ASPART 100 UNIT/ML ~~LOC~~ SOLN
10.0000 [IU] | Freq: Three times a day (TID) | SUBCUTANEOUS | Status: DC
  Administered 2020-08-01 – 2020-08-12 (×28): 10 [IU] via SUBCUTANEOUS

## 2020-08-01 NOTE — Progress Notes (Signed)
Spoke with patient on the phone about a new diagnosis of diabetes. HgbA1C of 7.6%. patient states that no one told her about the diagnosis. Explained to her the meaning of the A1C and what normal blood sugar should be. States that her mother has Type 2 diabetes. States that she is on a keto diet. Does not want to be on insulin. Did not have any questions for me. Seemed very surprised at the diagnosis and did not want to talk much about it.   Spoke with staff RN in regards to patient's new diagnosis.  Staff RN has been working with patient and educating her about diabetes.   Smith Mince RN BSN CDE Diabetes Coordinator Pager: [phone]  8am-5pm

## 2020-08-01 NOTE — Progress Notes (Signed)
Pt has been weaned to 6L [place] this shift and tolerated well. Pt has maintained O2 sat of 95%. Pt has been up in the chair this shift for approximately 3 hours. Pt's husband and mother called for an update. RN provided extensive education on diabetes again today. All questions addressed. RN reviewed diabetes education booklet with patient and discussed dietary changes that the patient had questions about. Pt voiced appreciation and understanding of materials. Pt concerned that with her not having a PCP would hinder her follow-up care. RN informed pt that we will provide her contact information for a PCP upon discharge. Pt voiced appreciation for that as well.

## 2020-08-01 NOTE — Evaluation (Signed)
Occupational Therapy Evaluation Patient Details Name: Penny Huerta MRN: [id number] DOB: 1966/07/01 Today's Date: 08/01/2020    History of Present Illness Patient is a 54 year old female admitted 11/12 PMH of obesity, atrial fibrillation, hyperlipidemia, anxiety who had progressively worsening SOB ~1 week. Patient diagnosed with COVID 11/9   Clinical Impression   Patient lives at home with spouse that utilizes a walker, so far has been doing well at home with COVID diagnosis. At baseline patient I, works in [employer], general practice. Currently patient min G to supervision level with functional transfers and self care due to decreased activity tolerance and mild unsteadiness with utilization of walker for functional ambulation. Patient maintain low 90s on 8L during activity. Recommend continued acute OT services to maximize activity tolerance and overall strengthening in order for patient to return to daily living activities safely.    Follow Up Recommendations  No OT follow up    Equipment Recommendations  Tub/shower seat       Precautions / Restrictions Precautions Precautions: Fall Precaution Comments: monitor O2, HFNC Restrictions Weight Bearing Restrictions: No      Mobility Bed Mobility Overal bed mobility: Modified Independent                  Transfers Overall transfer level: Needs assistance Equipment used: Rolling walker (2 wheeled) Transfers: Sit to/from Stand Sit to Stand: Min guard         General transfer comment: min G for safety    Balance Overall balance assessment: Needs assistance Sitting-balance support: Feet supported Sitting balance-Leahy Scale: Good     Standing balance support: No upper extremity supported;Bilateral upper extremity supported Standing balance-Leahy Scale: Fair Standing balance comment: patient able to perform peri care in standing without UE support, with ambulation utilizes RW                           ADL  either performed or assessed with clinical judgement   ADL Overall ADL's : Needs assistance/impaired     Grooming: Oral care;Wash/dry face;Wash/dry hands;Set up;Brushing hair;Sitting   Upper Body Bathing: Set up;Sitting Upper Body Bathing Details (indicate cue type and reason): wash under arms Lower Body Bathing: Supervison/ safety;Sit to/from stand Lower Body Bathing Details (indicate cue type and reason): to wash peri area in standing Upper Body Dressing : Set up;Sitting   Lower Body Dressing: Set up;Sitting/lateral leans Lower Body Dressing Details (indicate cue type and reason): to don socks Toilet Transfer: Min guard;Ambulation;RW Toilet Transfer Details (indicate cue type and reason): simulated with functional ambulation in room and transfer to recliner, min G for safety as patient has limited activity tolerance and mildly unsteady Toileting- Clothing Manipulation and Hygiene: Supervision/safety;Sit to/from stand       Functional mobility during ADLs: Min guard;Rolling walker General ADL Comments: patient with decreased strength and cardiopulmonary status impacting safety and independence with self care                  Pertinent Vitals/Pain Pain Assessment: Faces Faces Pain Scale: No hurt     Hand Dominance  (did not specify)   Extremity/Trunk Assessment Upper Extremity Assessment Upper Extremity Assessment: Generalized weakness   Lower Extremity Assessment Lower Extremity Assessment: Defer to PT evaluation       Communication Communication Communication: No difficulties   Cognition Arousal/Alertness: Awake/alert Behavior During Therapy: WFL for tasks assessed/performed Overall Cognitive Status: Within Functional Limits for tasks assessed  General Comments  patient O2 maintainined low 90s on 8L throughout session            Home Living Family/patient expects to be discharged to:: Private  residence Living Arrangements: Spouse/significant other Available Help at Discharge: Family;Available PRN/intermittently Type of Home: House Home Access: Stairs to enter [employer] of Steps: 4 Entrance Stairs-Rails: None Home Layout: Two level;Bed/bath upstairs Alternate Level Stairs-Number of Steps: 14   Bathroom Shower/Tub: Walk-in shower;Tub/shower unit   Bathroom Toilet: Standard     Home Equipment: Environmental consultant - 2 wheels   Additional Comments: spouse uses walker      Prior Functioning/Environment Level of Independence: Independent        Comments: works in Doctor, general practice        OT Problem List: Decreased activity tolerance;Impaired balance (sitting and/or standing);Cardiopulmonary status limiting activity;Obesity;Decreased knowledge of use of DME or AE      OT Treatment/Interventions: Self-care/ADL training;Therapeutic exercise;Energy conservation;DME and/or AE instruction;Therapeutic activities;Patient/family education;Balance training    OT Goals(Current goals can be found in the care plan section) Acute Rehab OT Goals Patient Stated Goal: get home OT Goal Formulation: With patient Time For Goal Achievement: 08/15/20 Potential to Achieve Goals: Good  OT Frequency: Min 2X/week           Co-evaluation PT/OT/SLP Co-Evaluation/Treatment: Yes Reason for Co-Treatment: To address functional/ADL transfers   OT goals addressed during session: ADL's and self-care      AM-PAC OT "6 Clicks" Daily Activity     Outcome Measure Help from another person eating meals?: None Help from another person taking care of personal grooming?: A Little Help from another person toileting, which includes using toliet, bedpan, or urinal?: A Little Help from another person bathing (including washing, rinsing, drying)?: A Little Help from another person to put on and taking off regular upper body clothing?: A Little Help from another person to put on and taking off  regular lower body clothing?: A Little 6 Click Score: 19   End of Session Equipment Utilized During Treatment: Oxygen;Rolling walker Nurse Communication: Mobility status  Activity Tolerance: Patient tolerated treatment well Patient left: in chair;with call bell/phone within reach;with nursing/sitter in room  OT Visit Diagnosis: Other abnormalities of gait and mobility (R26.89)                Time: 6546-5035 OT Time Calculation (min): 40 min Charges:  OT General Charges $OT Visit: 1 Visit OT Evaluation $OT Eval Low Complexity: 1 Low OT Treatments $Self Care/Home Management : 8-22 mins  Marlyce Huge OT OT pager: [phone]  Carmelia Roller 08/01/2020, 10:21 AM

## 2020-08-01 NOTE — Evaluation (Signed)
Physical Therapy Evaluation Patient Details Name: Penny Huerta MRN: [id number] DOB: 1966/04/02 Today's Date: 08/01/2020   History of Present Illness  Patient is a 54 year old female admitted 11/12 PMH of obesity, atrial fibrillation, hyperlipidemia, anxiety who had progressively worsening SOB ~1 week. Patient diagnosed with COVID 11/9  Clinical Impression  The patient received on 8 L HFNC, SPO2 955. Patient mobilized to sitting and stood at bed edge for hygiene. SPO2 91%.  Patient ambulated x 25' using RW. SPO2 91%. Patient has bed and bath on second floor. Spouse uses a RW.  Pt admitted with above diagnosis.  Pt currently with functional limitations due to the deficits listed below (see PT Problem List). Pt will benefit from skilled PT to increase their independence and safety with mobility to allow discharge to the venue listed below.     Follow Up Recommendations Home health PT    Equipment Recommendations  Rolling walker with 5" wheels    Recommendations for Other Services       Precautions / Restrictions Precautions Precautions: Fall Precaution Comments: monitor O2, HFNC Restrictions Weight Bearing Restrictions: No      Mobility  Bed Mobility Overal bed mobility: Modified Independent                  Transfers Overall transfer level: Needs assistance Equipment used: Rolling walker (2 wheeled) Transfers: Sit to/from Stand Sit to Stand: Min guard         General transfer comment: min G for safety  Ambulation/Gait Ambulation/Gait assistance: Min guard;+2 safety/equipment Gait Distance (Feet): 25 Feet Assistive device: Rolling walker (2 wheeled) Gait Pattern/deviations: Step-through pattern     General Gait Details: gait slow, reports weakness.  Stairs            Wheelchair Mobility    Modified Rankin (Stroke Patients Only)       Balance Overall balance assessment: Needs assistance Sitting-balance support: Feet supported Sitting  balance-Leahy Scale: Good     Standing balance support: No upper extremity supported;Bilateral upper extremity supported Standing balance-Leahy Scale: Fair Standing balance comment: patient able to perform peri care in standing without UE support, with ambulation utilizes RW                             Pertinent Vitals/Pain Pain Assessment: No/denies pain Faces Pain Scale: No hurt    Home Living Family/patient expects to be discharged to:: Private residence Living Arrangements: Spouse/significant other Available Help at Discharge: Family;Available PRN/intermittently Type of Home: House Home Access: Stairs to enter Entrance Stairs-Rails: None Entrance Stairs-Number of Steps: 4 Home Layout: Two level;Bed/bath upstairs Home Equipment: Walker - 2 wheels Additional Comments: spouse uses walker    Prior Function Level of Independence: Independent         Comments: works in Estate manager/land agent Dominance   Dominant Hand: Right    Extremity/Trunk Assessment   Upper Extremity Assessment Upper Extremity Assessment: Generalized weakness    Lower Extremity Assessment Lower Extremity Assessment: Generalized weakness    Cervical / Trunk Assessment Cervical / Trunk Assessment: Normal  Communication   Communication: No difficulties  Cognition Arousal/Alertness: Awake/alert Behavior During Therapy: WFL for tasks assessed/performed Overall Cognitive Status: Within Functional Limits for tasks assessed  General Comments General comments (skin integrity, edema, etc.): patient O2 maintainined low 90s on 8L throughout session    Exercises     Assessment/Plan    PT Assessment Patient needs continued PT services  PT Problem List Decreased strength;Decreased activity tolerance;Decreased mobility;Cardiopulmonary status limiting activity       PT Treatment Interventions DME instruction;Gait  training;Stair training;Functional mobility training;Therapeutic activities;Therapeutic exercise;Patient/family education    PT Goals (Current goals can be found in the Care Plan section)  Acute Rehab PT Goals Patient Stated Goal: get home PT Goal Formulation: With patient Time For Goal Achievement: 08/15/20 Potential to Achieve Goals: Good    Frequency Min 3X/week   Barriers to discharge        Co-evaluation PT/OT/SLP Co-Evaluation/Treatment: Yes Reason for Co-Treatment: For patient/therapist safety PT goals addressed during session: Mobility/safety with mobility OT goals addressed during session: ADL's and self-care       AM-PAC PT "6 Clicks" Mobility  Outcome Measure Help needed turning from your back to your side while in a flat bed without using bedrails?: A Little Help needed moving from lying on your back to sitting on the side of a flat bed without using bedrails?: A Little Help needed moving to and from a bed to a chair (including a wheelchair)?: A Little Help needed standing up from a chair using your arms (e.g., wheelchair or bedside chair)?: A Little Help needed to walk in hospital room?: A Little Help needed climbing 3-5 steps with a railing? : A Lot 6 Click Score: 17    End of Session Equipment Utilized During Treatment: Oxygen Activity Tolerance: Patient tolerated treatment well Patient left: in chair;with call bell/phone within reach;with chair alarm set Nurse Communication: Mobility status PT Visit Diagnosis: Muscle weakness (generalized) (M62.81);Difficulty in walking, not elsewhere classified (R26.2)    Time: 1610-9604 PT Time Calculation (min) (ACUTE ONLY): 31 min   Charges:   PT Evaluation $PT Eval Low Complexity: 1 Low          Blanchard Kelch PT Acute Rehabilitation Services Pager [phone] Office [phone]   Rada Hay 08/01/2020, 1:12 PM

## 2020-08-01 NOTE — Plan of Care (Signed)
  Problem: Education: Goal: Knowledge of risk factors and measures for prevention of condition will improve Outcome: Progressing   Problem: Coping: Goal: Psychosocial and spiritual needs will be supported Outcome: Progressing   Problem: Respiratory: Goal: Will maintain a patent airway Outcome: Progressing Goal: Complications related to the disease process, condition or treatment will be avoided or minimized Outcome: Progressing   

## 2020-08-01 NOTE — Progress Notes (Signed)
[facility] PROGRESS NOTE    Progress Note  Penny Huerta  IRW:[id number] DOB: 05-Jul-1966 DOA: 07/26/2020 PCP: Patient, No Pcp Per     Brief Narrative:   Penny Huerta is an 54 y.o. female past medical history of obesity paroxysmal atrial fibrillation antigen positive COVID-19 infection diagnosed 08/02/2020 status post treatment who presents on 07/26/2020 with hypoxia and shortness of breath chest x-ray showed bilateral infiltrates with a SARS-CoV-2 PCR positive CRP of 18.  Assessment/Plan:   Acute hypoxemic respiratory failure due to COVID-19 [facility]) She has completed her course of IV remdesivir, currently on steroids and baricitinib, will plan for 10-day course of steroids and 14 days of baricitinib or when she is discharged he can be DC'd. Now requiring 8 L oxygen to keep saturation greater than 90%, encourage out of bed to chair incentive spirometry flutter valve. Try to prone for at least 16 hours a day. Continue IV Lasix regards and O's and daily weights.  Acute pulmonary edema/due to acute diastolic heart failure: Cont.  IV Lasix she is negative about 4 L. Basic metabolic panel is pending this morning, continue fluid restrictions Daily weight strict I's and O's. She has had good diuresis.  Insulin-dependent diabetes mellitus type 2: With an A1c of 7.6 she is hyperglycemic due to steroids. Blood glucose fairly controlled continue long-acting insulin plus sliding scale.  Paroxysmal atrial fibrillation: Currently rate controlled in sinus rhythm.  Morbid obesity: Noted  DVT prophylaxis: lovenox Family Communication:none Status is: Inpatient  Remains inpatient appropriate because:Hemodynamically unstable   Dispo: The patient is from: Home              Anticipated d/c is to: Home              Anticipated d/c date is: > 3 days              Patient currently is not medically stable to d/c.   Code Status:     Code Status Orders  (From admission, onward)          Start     Ordered   07/26/20 1636  Full code  Continuous        07/26/20 1635        Code Status History    This patient has a current code status but no historical code status.   Advance Care Planning Activity        IV Access:    Peripheral IV   Procedures and diagnostic studies:   No results found.   Medical Consultants:    None.  Anti-Infectives:   Baricitinib  Subjective:    [name] relates feels tired today, still with no appetite.  Objective:    Vitals:   07/31/20 1600 07/31/20 2118 08/01/20 0500 08/01/20 0504  BP:  125/71  (!) 145/84  Pulse:  66  67  Resp:  16  19  Temp:    98.9 F (37.2 C)  TempSrc:    Oral  SpO2: 93% 92%  94%  Weight:   (!) 137.5 kg   Height:       SpO2: 94 % O2 Flow Rate (L/min): 8 L/min FiO2 (%): 100 %   Intake/Output Summary (Last 24 hours) at 08/01/2020 0831 Last data filed at 08/01/2020 0507 Gross per 24 hour  Intake 720 ml  Output 2200 ml  Net -1480 ml   Filed Weights   07/30/20 0500 07/31/20 0500 08/01/20 0500  Weight: (!) 137.4 kg (!) 137.4 kg Marland Kitchen)  137.5 kg    Exam: General exam: In no acute distress. Respiratory system: Good air movement and clear to auscultation. Cardiovascular system: S1 & S2 heard, RRR. No JVD. Gastrointestinal system: Abdomen is nondistended, soft and nontender. Extremities: No pedal edema. Skin: No rashes, lesions or ulcers  Data Reviewed:    Labs: Basic Metabolic Panel: Recent Labs  Lab 07/27/20 0552 07/27/20 0552 07/28/20 0509 07/28/20 0509 07/29/20 0429 07/29/20 0429 07/30/20 0451 07/31/20 0441  NA 142  --  140  --  140  --  138 134*  K 3.9   < > 3.5   < > 3.5   < > 3.5 3.9  CL 105  --  103  --  103  --  101 96*  CO2 26  --  25  --  25  --  25 26  GLUCOSE 240*  --  239*  --  196*  --  180* 206*  BUN 27*  --  28*  --  31*  --  30* 35*  CREATININE 0.63  --  0.66  --  0.77  --  0.63 0.68  CALCIUM 8.5*  --  8.3*  --  8.2*  --  8.4* 8.6*   < > = values  in this interval not displayed.   GFR Estimated Creatinine Clearance: 116.3 mL/min (by C-G formula based on SCr of 0.68 mg/dL). Liver Function Tests: Recent Labs  Lab 07/27/20 0552 07/28/20 0509 07/29/20 0429 07/30/20 0451 07/31/20 0441  AST 33 28 65* 44* 62*  ALT 72* 56* 59* 62* 68*  ALKPHOS 57 54 53 48 56  BILITOT 0.7 0.8 1.0 1.0 1.6*  PROT 7.5 7.3 7.0 7.6 8.1  ALBUMIN 3.0* 3.0* 3.0* 3.2* 3.4*   No results for input(s): LIPASE, AMYLASE in the last 168 hours. No results for input(s): AMMONIA in the last 168 hours. Coagulation profile No results for input(s): INR, PROTIME in the last 168 hours. COVID-19 Labs  Recent Labs    07/30/20 0451 07/31/20 0441  DDIMER 0.33 0.36  CRP 2.5* 1.4*    No results found for: SARSCOV2NAA  CBC: Recent Labs  Lab 07/26/20 1425 07/26/20 1728 07/27/20 0552 07/28/20 0509  WBC 9.8 8.4 9.6 14.7*  NEUTROABS 7.9*  --  7.2 12.0*  HGB 13.2 12.9 12.9 13.6  HCT 41.3 38.6 40.6 42.7  MCV 90.6 88.7 91.2 89.9  PLT 221 208 239 302   Cardiac Enzymes: No results for input(s): CKTOTAL, CKMB, CKMBINDEX, TROPONINI in the last 168 hours. BNP (last 3 results) No results for input(s): PROBNP in the last 8760 hours. CBG: Recent Labs  Lab 07/31/20 0748 07/31/20 1206 07/31/20 1632 07/31/20 2115 08/01/20 0758  GLUCAP 242* 293* 276* 274* 238*   D-Dimer: Recent Labs    07/30/20 0451 07/31/20 0441  DDIMER 0.33 0.36   Hgb A1c: No results for input(s): HGBA1C in the last 72 hours. Lipid Profile: No results for input(s): CHOL, HDL, LDLCALC, TRIG, CHOLHDL, LDLDIRECT in the last 72 hours. Thyroid function studies: No results for input(s): TSH, T4TOTAL, T3FREE, THYROIDAB in the last 72 hours.  Invalid input(s): FREET3 Anemia work up: No results for input(s): VITAMINB12, FOLATE, FERRITIN, TIBC, IRON, RETICCTPCT in the last 72 hours. Sepsis Labs: Recent Labs  Lab 07/26/20 1425 07/26/20 1728 07/26/20 1730 07/27/20 0552 07/28/20 0509    PROCALCITON <0.10  --   --   --   --   WBC 9.8 8.4  --  9.6 14.7*  LATICACIDVEN 1.4  --  1.2  --   --  Microbiology Recent Results (from the past 240 hour(s))  Blood Culture (routine x 2)     Status: None   Collection Time: 07/26/20  2:25 PM   Specimen: Site Not Specified; Blood  Result Value Ref Range Status   Specimen Description   Final    SITE NOT SPECIFIED Performed at [facility], [address] [place], [place] [address]    Special Requests   Final    BOTTLES DRAWN AEROBIC AND ANAEROBIC Blood Culture adequate volume Performed at [facility], [address] [place], [place] [address]    Culture   Final    NO GROWTH 5 DAYS Performed at [facility], [address] [place], [place] [address]    Report Status 07/31/2020 FINAL  Final  Blood Culture (routine x 2)     Status: None   Collection Time: 07/26/20  2:25 PM   Specimen: Site Not Specified; Blood  Result Value Ref Range Status   Specimen Description   Final    SITE NOT SPECIFIED Performed at [facility], [address], [place], [place] [address]    Special Requests   Final    BOTTLES DRAWN AEROBIC AND ANAEROBIC Blood Culture results may not be optimal due to an inadequate volume of blood received in culture bottles Performed at [facility], [address] [place], [place] [address]    Culture   Final    NO GROWTH 5 DAYS Performed at [facility], [address] [place], [place] [address]    Report Status 07/31/2020 FINAL  Final     Medications:   . baricitinib  4 mg Oral Daily  . enoxaparin (LOVENOX) injection  70 mg Subcutaneous Q24H  . furosemide  80 mg Intravenous BID  . insulin aspart  0-20 Units Subcutaneous TID WC  . insulin aspart  0-5 Units Subcutaneous QHS  . insulin aspart  8 Units Subcutaneous TID WC  . insulin detemir  45 Units Subcutaneous BID  . linagliptin  5 mg Oral Daily  .  methylPREDNISolone (SOLU-MEDROL) injection  80 mg Intravenous Q12H  . potassium chloride  40 mEq Oral Daily  . sodium chloride flush  3 mL Intravenous Q12H   Continuous Infusions: . sodium chloride        LOS: 6 days   Marinda Elk  [facility]  08/01/2020, 8:31 AM

## 2020-08-01 NOTE — Plan of Care (Signed)
°  Problem: Education: Goal: Knowledge of risk factors and measures for prevention of condition will improve Outcome: Progressing   Problem: Coping: Goal: Psychosocial and spiritual needs will be supported Outcome: Progressing   Problem: Respiratory: Goal: Will maintain a patent airway Outcome: Progressing Goal: Complications related to the disease process, condition or treatment will be avoided or minimized Outcome: Progressing   Problem: Education: Goal: Ability to describe self-care measures that may prevent or decrease complications (Diabetes Survival Skills Education) will improve Outcome: Progressing   Problem: Coping: Goal: Ability to adjust to condition or change in health will improve Outcome: Progressing   Problem: Fluid Volume: Goal: Ability to maintain a balanced intake and output will improve Outcome: Progressing   Problem: Nutritional: Goal: Maintenance of adequate nutrition will improve Outcome: Progressing Goal: Progress toward achieving an optimal weight will improve Outcome: Progressing   

## 2020-08-01 NOTE — Plan of Care (Signed)
  Problem: Education: Goal: Knowledge of risk factors and measures for prevention of condition will improve 08/01/2020 1419 by Vinnie Langton, RN Outcome: Progressing 08/01/2020 1416 by Vinnie Langton, RN Outcome: Progressing   Problem: Coping: Goal: Psychosocial and spiritual needs will be supported 08/01/2020 1419 by Vinnie Langton, RN Outcome: Progressing 08/01/2020 1416 by Vinnie Langton, RN Outcome: Progressing   Problem: Respiratory: Goal: Will maintain a patent airway 08/01/2020 1419 by Vinnie Langton, RN Outcome: Progressing 08/01/2020 1416 by Vinnie Langton, RN Outcome: Progressing Goal: Complications related to the disease process, condition or treatment will be avoided or minimized 08/01/2020 1419 by Vinnie Langton, RN Outcome: Progressing 08/01/2020 1416 by Vinnie Langton, RN Outcome: Progressing   Problem: Education: Goal: Ability to describe self-care measures that may prevent or decrease complications (Diabetes Survival Skills Education) will improve Outcome: Progressing   Problem: Coping: Goal: Ability to adjust to condition or change in health will improve Outcome: Progressing   Problem: Fluid Volume: Goal: Ability to maintain a balanced intake and output will improve Outcome: Progressing   Problem: Nutritional: Goal: Maintenance of adequate nutrition will improve Outcome: Progressing Goal: Progress toward achieving an optimal weight will improve Outcome: Progressing

## 2020-08-02 DIAGNOSIS — R739 Hyperglycemia, unspecified: Secondary | ICD-10-CM | POA: Diagnosis not present

## 2020-08-02 DIAGNOSIS — U071 COVID-19: Secondary | ICD-10-CM | POA: Diagnosis not present

## 2020-08-02 DIAGNOSIS — I4891 Unspecified atrial fibrillation: Secondary | ICD-10-CM | POA: Diagnosis not present

## 2020-08-02 DIAGNOSIS — J9601 Acute respiratory failure with hypoxia: Secondary | ICD-10-CM | POA: Diagnosis not present

## 2020-08-02 LAB — BASIC METABOLIC PANEL
Anion gap: 12 (ref 5–15)
BUN: 40 mg/dL — ABNORMAL HIGH (ref 6–20)
CO2: 29 mmol/L (ref 22–32)
Calcium: 8.7 mg/dL — ABNORMAL LOW (ref 8.9–10.3)
Chloride: 88 mmol/L — ABNORMAL LOW (ref 98–111)
Creatinine, Ser: 0.63 mg/dL (ref 0.44–1.00)
GFR, Estimated: >60 mL/min (ref >60)
Glucose, Bld: 215 mg/dL — ABNORMAL HIGH (ref 70–99)
Potassium: 3.2 mmol/L — ABNORMAL LOW (ref 3.5–5.1)
Sodium: 129 mmol/L — ABNORMAL LOW (ref 135–145)

## 2020-08-02 LAB — GLUCOSE, CAPILLARY
Glucose-Capillary: 206 mg/dL — ABNORMAL HIGH (ref 70–99)
Glucose-Capillary: 247 mg/dL — ABNORMAL HIGH (ref 70–99)
Glucose-Capillary: 328 mg/dL — ABNORMAL HIGH (ref 70–99)
Glucose-Capillary: 100 mg/dL — ABNORMAL HIGH (ref 70–99)
Glucose-Capillary: 146 mg/dL — ABNORMAL HIGH (ref 70–99)

## 2020-08-02 MED ORDER — METHYLPREDNISOLONE SODIUM SUCC 125 MG IJ SOLR
40.0000 mg | Freq: Two times a day (BID) | INTRAMUSCULAR | Status: DC
  Administered 2020-08-02 – 2020-08-04 (×4): 40 mg via INTRAVENOUS
  Filled 2020-08-02 (×3): qty 2

## 2020-08-02 MED ORDER — POTASSIUM CHLORIDE CRYS ER 20 MEQ PO TBCR
40.0000 meq | EXTENDED_RELEASE_TABLET | Freq: Two times a day (BID) | ORAL | Status: AC
  Administered 2020-08-02: 40 meq via ORAL
  Filled 2020-08-02 (×2): qty 2

## 2020-08-02 MED ORDER — INSULIN DETEMIR 100 UNIT/ML ~~LOC~~ SOLN
60.0000 [IU] | Freq: Two times a day (BID) | SUBCUTANEOUS | Status: DC
  Administered 2020-08-02 (×2): 60 [IU] via SUBCUTANEOUS
  Filled 2020-08-02 (×3): qty 0.6

## 2020-08-02 NOTE — Progress Notes (Signed)
CBG 206 mg/dl, checked by this RN, due insulin and sliding scale given, noted data did not transmit from the POCT to the chart.

## 2020-08-02 NOTE — Progress Notes (Signed)
[facility] PROGRESS NOTE    Progress Note  Penny Huerta  ZHG:[id number] DOB: 05-07-1966 DOA: 07/26/2020 PCP: Patient, No Pcp Per     Brief Narrative:   Penny Huerta is an 54 y.o. female past medical history of obesity paroxysmal atrial fibrillation antigen positive COVID-19 infection diagnosed 08/02/2020 status post treatment who presents on 07/26/2020 with hypoxia and shortness of breath chest x-ray showed bilateral infiltrates with a SARS-CoV-2 PCR positive CRP of 18.  Assessment/Plan:   Acute hypoxemic respiratory failure due to COVID-19 [facility]) She has completed her course of IV remdesivir, currently on steroids and baricitinib, will plan for 10-day course of steroids and 14 days of baricitinib or when she is discharged he can be DC'd. She is improving nicely she is now requiring 5 L of oxygen to keep saturations greater than 90%, encourage out of bed to chair to use incentive spirometry and flutter valve. Try to keep the patient prone for at least 16 hours a day. Continue IV Lasix and strict I's and O's. She needs to have a fluid restricted and strict I's and O's need to be recorded accurately.  Acute pulmonary edema/due to acute diastolic heart failure: Cont.  IV Lasix she is negative about 4 L. She is mildly hypokalemic today we will replete orally. Continue IV Lasix she has had fair diuresis this morning, she is mildly hyponatremic. Will monitor sodium closely.  Insulin-dependent diabetes mellitus type 2: With an A1c of 7.6 she is hyperglycemic due to steroids. Her blood glucose continues to be erratic increase long-acting insulin, continue steroids.  Paroxysmal atrial fibrillation: Currently rate controlled in sinus rhythm.  Morbid obesity: Noted  DVT prophylaxis: lovenox Family Communication:none Status is: Inpatient  Remains inpatient appropriate because:Hemodynamically unstable   Dispo: The patient is from: Home              Anticipated d/c is to:  Home              Anticipated d/c date is: > 3 days              Patient currently is not medically stable to d/c.   Code Status:     Code Status Orders  (From admission, onward)         Start     Ordered   07/26/20 1636  Full code  Continuous        07/26/20 1635        Code Status History    This patient has a current code status but no historical code status.   Advance Care Planning Activity        IV Access:    Peripheral IV   Procedures and diagnostic studies:   No results found.   Medical Consultants:    None.  Anti-Infectives:   Baricitinib  Subjective:    Penny Huerta no new complaints this morning she relates she rested well yesterday.  Objective:    Vitals:   08/01/20 1600 08/01/20 1800 08/01/20 2211 08/02/20 0536  BP:   118/72 130/78  Pulse:   74 64  Resp:   18 20  Temp:   98.5 F (36.9 C) 98.1 F (36.7 C)  TempSrc:   Oral Oral  SpO2: 94% 95% 95% 93%  Weight:      Height:       SpO2: 93 % O2 Flow Rate (L/min): 5 L/min FiO2 (%): 100 %   Intake/Output Summary (Last 24 hours) at 08/02/2020 1962 Last data filed  at 08/01/2020 2200 Gross per 24 hour  Intake 1080 ml  Output 1800 ml  Net -720 ml   Filed Weights   07/30/20 0500 07/31/20 0500 08/01/20 0500  Weight: (!) 137.4 kg (!) 137.4 kg (!) 137.5 kg    Exam: General exam: In no acute distress. Respiratory system: Good air movement and clear to auscultation. Cardiovascular system: S1 & S2 heard, RRR. No JVD. Gastrointestinal system: Abdomen is nondistended, soft and nontender.  Extremities: No pedal edema. Skin: No rashes, lesions or ulcers  Data Reviewed:    Labs: Basic Metabolic Panel: Recent Labs  Lab 07/29/20 0429 07/29/20 0429 07/30/20 0451 07/30/20 0451 07/31/20 0441 07/31/20 0441 08/01/20 0912 08/02/20 0533  NA 140  --  138  --  134*  --  132* 129*  K 3.5   < > 3.5   < > 3.9   < > 3.4* 3.2*  CL 103  --  101  --  96*  --  89* 88*  CO2 25  --  25   --  26  --  27 29  GLUCOSE 196*  --  180*  --  206*  --  238* 215*  BUN 31*  --  30*  --  35*  --  42* 40*  CREATININE 0.77  --  0.63  --  0.68  --  0.72 0.63  CALCIUM 8.2*  --  8.4*  --  8.6*  --  8.7* 8.7*   < > = values in this interval not displayed.   GFR Estimated Creatinine Clearance: 116.3 mL/min (by C-G formula based on SCr of 0.63 mg/dL). Liver Function Tests: Recent Labs  Lab 07/27/20 0552 07/28/20 0509 07/29/20 0429 07/30/20 0451 07/31/20 0441  AST 33 28 65* 44* 62*  ALT 72* 56* 59* 62* 68*  ALKPHOS 57 54 53 48 56  BILITOT 0.7 0.8 1.0 1.0 1.6*  PROT 7.5 7.3 7.0 7.6 8.1  ALBUMIN 3.0* 3.0* 3.0* 3.2* 3.4*   No results for input(s): LIPASE, AMYLASE in the last 168 hours. No results for input(s): AMMONIA in the last 168 hours. Coagulation profile No results for input(s): INR, PROTIME in the last 168 hours. COVID-19 Labs  Recent Labs    07/31/20 0441 08/01/20 0912  DDIMER 0.36 0.35  CRP 1.4* 1.0*    No results found for: SARSCOV2NAA  CBC: Recent Labs  Lab 07/26/20 1425 07/26/20 1728 07/27/20 0552 07/28/20 0509  WBC 9.8 8.4 9.6 14.7*  NEUTROABS 7.9*  --  7.2 12.0*  HGB 13.2 12.9 12.9 13.6  HCT 41.3 38.6 40.6 42.7  MCV 90.6 88.7 91.2 89.9  PLT 221 208 239 302   Cardiac Enzymes: No results for input(s): CKTOTAL, CKMB, CKMBINDEX, TROPONINI in the last 168 hours. BNP (last 3 results) No results for input(s): PROBNP in the last 8760 hours. CBG: Recent Labs  Lab 07/31/20 2115 08/01/20 0758 08/01/20 1226 08/01/20 1726 08/01/20 2207  GLUCAP 274* 238* 215* 248* 206*   D-Dimer: Recent Labs    07/31/20 0441 08/01/20 0912  DDIMER 0.36 0.35   Hgb A1c: No results for input(s): HGBA1C in the last 72 hours. Lipid Profile: No results for input(s): CHOL, HDL, LDLCALC, TRIG, CHOLHDL, LDLDIRECT in the last 72 hours. Thyroid function studies: No results for input(s): TSH, T4TOTAL, T3FREE, THYROIDAB in the last 72 hours.  Invalid input(s):  FREET3 Anemia work up: No results for input(s): VITAMINB12, FOLATE, FERRITIN, TIBC, IRON, RETICCTPCT in the last 72 hours. Sepsis Labs: Recent Labs  Lab 07/26/20  1425 07/26/20 1728 07/26/20 1730 07/27/20 0552 07/28/20 0509  PROCALCITON <0.10  --   --   --   --   WBC 9.8 8.4  --  9.6 14.7*  LATICACIDVEN 1.4  --  1.2  --   --    Microbiology Recent Results (from the past 240 hour(s))  Blood Culture (routine x 2)     Status: None   Collection Time: 07/26/20  2:25 PM   Specimen: Site Not Specified; Blood  Result Value Ref Range Status   Specimen Description   Final    SITE NOT SPECIFIED Performed at [facility], [address] [place], [place] [address]    Special Requests   Final    BOTTLES DRAWN AEROBIC AND ANAEROBIC Blood Culture adequate volume Performed at [facility], [address], [place], [place] [address]    Culture   Final    NO GROWTH 5 DAYS Performed at [facility], [address], [place], [place] [address]    Report Status 07/31/2020 FINAL  Final  Blood Culture (routine x 2)     Status: None   Collection Time: 07/26/20  2:25 PM   Specimen: Site Not Specified; Blood  Result Value Ref Range Status   Specimen Description   Final    SITE NOT SPECIFIED Performed at [facility], [address] [place], [place] [address]    Special Requests   Final    BOTTLES DRAWN AEROBIC AND ANAEROBIC Blood Culture results may not be optimal due to an inadequate volume of blood received in culture bottles Performed at [facility], [address], [place], [place] [address]    Culture   Final    NO GROWTH 5 DAYS Performed at [facility], [address], [place], [place] [address]    Report Status 07/31/2020 FINAL  Final     Medications:    baricitinib  4 mg Oral Daily   enoxaparin (LOVENOX) injection  70 mg Subcutaneous Q24H   furosemide  80 mg Intravenous BID   insulin  aspart  0-20 Units Subcutaneous TID WC   insulin aspart  0-5 Units Subcutaneous QHS   insulin aspart  10 Units Subcutaneous TID WC   insulin detemir  45 Units Subcutaneous BID   linagliptin  5 mg Oral Daily   methylPREDNISolone (SOLU-MEDROL) injection  80 mg Intravenous Q12H   potassium chloride  40 mEq Oral Daily   sodium chloride flush  3 mL Intravenous Q12H   Continuous Infusions:  sodium chloride        LOS: 7 days   Marinda Elk  [facility]  08/02/2020, 8:12 AM

## 2020-08-03 DIAGNOSIS — J9601 Acute respiratory failure with hypoxia: Secondary | ICD-10-CM | POA: Diagnosis not present

## 2020-08-03 DIAGNOSIS — R739 Hyperglycemia, unspecified: Secondary | ICD-10-CM | POA: Diagnosis not present

## 2020-08-03 DIAGNOSIS — U071 COVID-19: Secondary | ICD-10-CM | POA: Diagnosis not present

## 2020-08-03 DIAGNOSIS — I4891 Unspecified atrial fibrillation: Secondary | ICD-10-CM | POA: Diagnosis not present

## 2020-08-03 LAB — BASIC METABOLIC PANEL
Anion gap: 14 (ref 5–15)
BUN: 47 mg/dL — ABNORMAL HIGH (ref 6–20)
CO2: 29 mmol/L (ref 22–32)
Calcium: 8.7 mg/dL — ABNORMAL LOW (ref 8.9–10.3)
Chloride: 90 mmol/L — ABNORMAL LOW (ref 98–111)
Creatinine, Ser: 0.78 mg/dL (ref 0.44–1.00)
GFR, Estimated: >60 mL/min (ref >60)
Glucose, Bld: 228 mg/dL — ABNORMAL HIGH (ref 70–99)
Potassium: 3.4 mmol/L — ABNORMAL LOW (ref 3.5–5.1)
Sodium: 133 mmol/L — ABNORMAL LOW (ref 135–145)

## 2020-08-03 LAB — GLUCOSE, CAPILLARY
Glucose-Capillary: 251 mg/dL — ABNORMAL HIGH (ref 70–99)
Glucose-Capillary: 261 mg/dL — ABNORMAL HIGH (ref 70–99)
Glucose-Capillary: 220 mg/dL — ABNORMAL HIGH (ref 70–99)

## 2020-08-03 MED ORDER — INSULIN DETEMIR 100 UNIT/ML ~~LOC~~ SOLN
70.0000 [IU] | Freq: Two times a day (BID) | SUBCUTANEOUS | Status: DC
  Administered 2020-08-03: 70 [IU] via SUBCUTANEOUS
  Administered 2020-08-03: 60 [IU] via SUBCUTANEOUS
  Administered 2020-08-04: 70 [IU] via SUBCUTANEOUS
  Filled 2020-08-03 (×2): qty 0.7
  Filled 2020-08-03: qty 0.6
  Filled 2020-08-03: qty 0.7

## 2020-08-03 MED ORDER — POTASSIUM CHLORIDE CRYS ER 20 MEQ PO TBCR
40.0000 meq | EXTENDED_RELEASE_TABLET | Freq: Two times a day (BID) | ORAL | Status: DC
  Administered 2020-08-03: 40 meq via ORAL
  Filled 2020-08-03 (×2): qty 2

## 2020-08-03 NOTE — Progress Notes (Signed)
[facility] PROGRESS NOTE    Progress Note  ASTON LAWHORN  ZRA:[id number] DOB: Jun 30, 1966 DOA: 07/26/2020 PCP: Patient, No Pcp Per     Brief Narrative:   Penny Huerta is an 54 y.o. female past medical history of obesity paroxysmal atrial fibrillation antigen positive COVID-19 infection diagnosed 08/02/2020 status post treatment who presents on 07/26/2020 with hypoxia and shortness of breath chest x-ray showed bilateral infiltrates with a SARS-CoV-2 PCR positive CRP of 18.  Assessment/Plan:   Acute hypoxemic respiratory failure due to COVID-19 [facility]) She has completed her course of IV remdesivir, currently on steroids and baricitinib, will plan for 10-day course of steroids and 14 days of baricitinib or when she is discharged he can be DC'd. She has been weaned to 3 L of oxygen to keep saturations greater than 94%, encourage out of bed to chair incentive spirometry and flutter valve. We will continue IV Lasix as helping her oxygen needs. She is about 5 liters negative today, continue to restrict her fluid and monitor strict I's and O's. She needs to have a fluid restricted and strict I's and O's need to be recorded accurately.  Acute pulmonary edema/due to acute diastolic heart failure: She is negative about 5 L, will continue current dose of Lasix, her blood pressure seems to be holding steady. Due to her body size her volume status is hard to evaluate. Replete potassium orally recheck in the morning. Continue current dose of IV Lasix, her sodium is improving. Continue to monitor her creatinine closely and has remained stable.  Insulin-dependent diabetes mellitus type 2: With an A1c of 7.6 she is hyperglycemic due to steroids. Glucose continues to be significantly elevated, increase long-acting insulin.  Paroxysmal atrial fibrillation: Currently rate controlled in sinus rhythm.  Morbid obesity: Noted  DVT prophylaxis: lovenox Family Communication:none Status is:  Inpatient  Remains inpatient appropriate because:Hemodynamically unstable   Dispo: The patient is from: Home              Anticipated d/c is to: Home              Anticipated d/c date is: > 3 days              Patient currently is not medically stable to d/c.   Code Status:     Code Status Orders  (From admission, onward)         Start     Ordered   07/26/20 1636  Full code  Continuous        07/26/20 1635        Code Status History    This patient has a current code status but no historical code status.   Advance Care Planning Activity        IV Access:    Peripheral IV   Procedures and diagnostic studies:   No results found.   Medical Consultants:    None.  Anti-Infectives:   Baricitinib  Subjective:    Penny Huerta relates her breathing is better compared to last today she is very thirsty.  Objective:    Vitals:   08/02/20 2115 08/03/20 0510 08/03/20 0543 08/03/20 0706  BP: (!) 122/58   (!) 142/91  Pulse: 87   75  Resp: 20 16 16 18   Temp: 98.1 F (36.7 C)   98.2 F (36.8 C)  TempSrc: Oral   Oral  SpO2: 97% 93% 95% 94%  Weight:      Height:       SpO2:  94 % O2 Flow Rate (L/min): 3 L/min FiO2 (%): 100 %   Intake/Output Summary (Last 24 hours) at 08/03/2020 0102 Last data filed at 08/02/2020 2030 Gross per 24 hour  Intake 523 ml  Output 800 ml  Net -277 ml   Filed Weights   07/30/20 0500 07/31/20 0500 08/01/20 0500  Weight: (!) 137.4 kg (!) 137.4 kg (!) 137.5 kg    Exam: General exam: In no acute distress. Respiratory system: Good air movement and clear to auscultation. Cardiovascular system: S1 & S2 heard, RRR. No JVD. Gastrointestinal system: Abdomen is nondistended, soft and nontender.  Extremities: No pedal edema. Skin: No rashes, lesions or ulcers  Data Reviewed:    Labs: Basic Metabolic Panel: Recent Labs  Lab 07/30/20 0451 07/30/20 0451 07/31/20 0441 07/31/20 0441 08/01/20 0912 08/01/20 0912  08/02/20 0533 08/03/20 0528  NA 138  --  134*  --  132*  --  129* 133*  K 3.5   < > 3.9   < > 3.4*   < > 3.2* 3.4*  CL 101  --  96*  --  89*  --  88* 90*  CO2 25  --  26  --  27  --  29 29  GLUCOSE 180*  --  206*  --  238*  --  215* 228*  BUN 30*  --  35*  --  42*  --  40* 47*  CREATININE 0.63  --  0.68  --  0.72  --  0.63 0.78  CALCIUM 8.4*  --  8.6*  --  8.7*  --  8.7* 8.7*   < > = values in this interval not displayed.   GFR Estimated Creatinine Clearance: 116.3 mL/min (by C-G formula based on SCr of 0.78 mg/dL). Liver Function Tests: Recent Labs  Lab 07/28/20 0509 07/29/20 0429 07/30/20 0451 07/31/20 0441  AST 28 65* 44* 62*  ALT 56* 59* 62* 68*  ALKPHOS 54 53 48 56  BILITOT 0.8 1.0 1.0 1.6*  PROT 7.3 7.0 7.6 8.1  ALBUMIN 3.0* 3.0* 3.2* 3.4*   No results for input(s): LIPASE, AMYLASE in the last 168 hours. No results for input(s): AMMONIA in the last 168 hours. Coagulation profile No results for input(s): INR, PROTIME in the last 168 hours. COVID-19 Labs  Recent Labs    08/01/20 0912  DDIMER 0.35  CRP 1.0*    No results found for: SARSCOV2NAA  CBC: Recent Labs  Lab 07/28/20 0509  WBC 14.7*  NEUTROABS 12.0*  HGB 13.6  HCT 42.7  MCV 89.9  PLT 302   Cardiac Enzymes: No results for input(s): CKTOTAL, CKMB, CKMBINDEX, TROPONINI in the last 168 hours. BNP (last 3 results) No results for input(s): PROBNP in the last 8760 hours. CBG: Recent Labs  Lab 08/01/20 2207 08/02/20 0755 08/02/20 1141 08/02/20 1638 08/02/20 2119  GLUCAP 206* 247* 328* 100* 146*   D-Dimer: Recent Labs    08/01/20 0912  DDIMER 0.35   Hgb A1c: No results for input(s): HGBA1C in the last 72 hours. Lipid Profile: No results for input(s): CHOL, HDL, LDLCALC, TRIG, CHOLHDL, LDLDIRECT in the last 72 hours. Thyroid function studies: No results for input(s): TSH, T4TOTAL, T3FREE, THYROIDAB in the last 72 hours.  Invalid input(s): FREET3 Anemia work up: No results for  input(s): VITAMINB12, FOLATE, FERRITIN, TIBC, IRON, RETICCTPCT in the last 72 hours. Sepsis Labs: Recent Labs  Lab 07/28/20 0509  WBC 14.7*   Microbiology Recent Results (from the past 240 hour(s))  Blood  Culture (routine x 2)     Status: None   Collection Time: 07/26/20  2:25 PM   Specimen: Site Not Specified; Blood  Result Value Ref Range Status   Specimen Description   Final    SITE NOT SPECIFIED Performed at [facility], [address] [place], [place] [address]    Special Requests   Final    BOTTLES DRAWN AEROBIC AND ANAEROBIC Blood Culture adequate volume Performed at [facility], [address], [place], [place] [address]    Culture   Final    NO GROWTH 5 DAYS Performed at [facility], [address], [place], [place] [address]    Report Status 07/31/2020 FINAL  Final  Blood Culture (routine x 2)     Status: None   Collection Time: 07/26/20  2:25 PM   Specimen: Site Not Specified; Blood  Result Value Ref Range Status   Specimen Description   Final    SITE NOT SPECIFIED Performed at [facility], [address], [place], [place] [address]    Special Requests   Final    BOTTLES DRAWN AEROBIC AND ANAEROBIC Blood Culture results may not be optimal due to an inadequate volume of blood received in culture bottles Performed at [facility], [address], [place], [place] [address]    Culture   Final    NO GROWTH 5 DAYS Performed at [facility], [address] [place], [place] [address]    Report Status 07/31/2020 FINAL  Final     Medications:    baricitinib  4 mg Oral Daily   enoxaparin (LOVENOX) injection  70 mg Subcutaneous Q24H   furosemide  80 mg Intravenous BID   insulin aspart  0-20 Units Subcutaneous TID WC   insulin aspart  0-5 Units Subcutaneous QHS   insulin aspart  10 Units Subcutaneous TID WC   insulin detemir  60 Units Subcutaneous BID    linagliptin  5 mg Oral Daily   methylPREDNISolone (SOLU-MEDROL) injection  40 mg Intravenous Q12H   potassium chloride  40 mEq Oral Daily   potassium chloride  40 mEq Oral BID   sodium chloride flush  3 mL Intravenous Q12H   Continuous Infusions:  sodium chloride        LOS: 8 days   Marinda Elk  [facility]  08/03/2020, 8:22 AM

## 2020-08-03 NOTE — Progress Notes (Signed)
Occupational Therapy Treatment Patient Details Name: Penny Huerta MRN: [id number] DOB: 08/07/1966 Today's Date: 08/03/2020    History of present illness Patient is a 54 year old female admitted 11/12 PMH of obesity, atrial fibrillation, hyperlipidemia, anxiety who had progressively worsening SOB ~1 week. Patient diagnosed with COVID 11/9   OT comments  Treatment focused on activity tolerance needed for ADLs. Patient ambulated to bathroom with RW and stood at sink for grooming tasks approx 5 min on 3L [place]. Patient ambulated back to recliner and o2 sat 100%. Patient reports some fatigue but significant. Patient is concerned about having the endurance to ambulate the longer distance into her home and possible discharge home Monday. Patient provided with encouragement to continue use of IS and perform more mobility/activity - such as getting up to the [facility] for toileting instead of use of purewick. Cont POC.   Follow Up Recommendations  No OT follow up    Equipment Recommendations  Tub/shower seat    Recommendations for Other Services      Precautions / Restrictions Precautions Precautions: None       Mobility Bed Mobility Overal bed mobility: Independent                Transfers Overall transfer level: Needs assistance Equipment used: Rolling walker (2 wheeled) Transfers: Sit to/from Stand Sit to Stand: Min guard         General transfer comment: min guard to ambulate to bathroom, stand at sink and return to recliner. on 3L [place] -o2 sat 100% after activity.    Balance Overall balance assessment: Mild deficits observed, not formally tested         Standing balance support: During functional activity Standing balance-Leahy Scale: Fair Standing balance comment: with ambulation utilizes RW but able to use bilateral hands for ADLs                           ADL either performed or assessed with clinical judgement   ADL Overall ADL's : Needs  assistance/impaired     Grooming: Oral care;Wash/dry face;Brushing hair;Standing;Supervision/safety Grooming Details (indicate cue type and reason): Performed standing grooming task at sink in bathroom on 3L. Approx 5 minutes of standing.             Lower Body Dressing: Supervision/safety;Set up Lower Body Dressing Details (indicate cue type and reason): to don socks                     Vision Patient Visual Report: No change from baseline     Perception     Praxis      Cognition Arousal/Alertness: Awake/alert Behavior During Therapy: WFL for tasks assessed/performed Overall Cognitive Status: Within Functional Limits for tasks assessed                                          Exercises     Shoulder Instructions       General Comments      Pertinent Vitals/ Pain       Pain Assessment: No/denies pain  Home Living                                          Prior Functioning/Environment  Frequency  Min 2X/week        Progress Toward Goals  OT Goals(current goals can now be found in the care plan section)  Progress towards OT goals: Progressing toward goals  Acute Rehab OT Goals Patient Stated Goal: to be able to walk into her house OT Goal Formulation: With patient Time For Goal Achievement: 08/15/20 Potential to Achieve Goals: Good  Plan Discharge plan remains appropriate    Co-evaluation          OT goals addressed during session: ADL's and self-care      AM-PAC OT "6 Clicks" Daily Activity     Outcome Measure   Help from another person eating meals?: None Help from another person taking care of personal grooming?: A Little Help from another person toileting, which includes using toliet, bedpan, or urinal?: A Little Help from another person bathing (including washing, rinsing, drying)?: A Little Help from another person to put on and taking off regular upper body clothing?: A  Little Help from another person to put on and taking off regular lower body clothing?: A Little 6 Click Score: 19    End of Session Equipment Utilized During Treatment: Oxygen;Rolling walker  OT Visit Diagnosis: Other abnormalities of gait and mobility (R26.89)   Activity Tolerance Patient tolerated treatment well   Patient Left in chair;with call bell/phone within reach (left in care of Penny Huerta, PT)   Nurse Communication  (okay to see per RN.)        Time: [phone] OT Time Calculation (min): 15 min  Charges: OT General Charges $OT Visit: 1 Visit OT Treatments $Self Care/Home Management : 8-22 mins  Waldron Session, OTR/L Acute Care Rehab Services  Office [phone] Pager: [phone]    Kelli Churn 08/03/2020, 3:03 PM

## 2020-08-03 NOTE — Progress Notes (Signed)
Physical Therapy Treatment Patient Details Name: Penny Huerta MRN: [id number] DOB: 1966/04/12 Today's Date: 08/03/2020    History of Present Illness Patient is a 54 year old female admitted 11/12 PMH of obesity, atrial fibrillation, hyperlipidemia, anxiety who had progressively worsening SOB ~1 week. Patient diagnosed with COVID 11/9    PT Comments    Patient resting in bed on 3 L [place] , SPO2 100%. Patient ambulated x 40' on 3 L, SPO2 965.   SPO2 on RA 98%. Ambulated x 40' on RA  With SPO2 89%. Dyspnea 3/4.Placed on 2 L, SPO2 945. RN notified of 2 L. Patient is making progress in mobility while requiring less oxygen. Continue increased activity. Will practice steps another visit prior to DC.  Patient has ~ 65' from car to door and 5 steps to a stoop. Patient will stay on first floor  Initially.  Follow Up Recommendations  Home health PT     Equipment Recommendations  Rolling walker with 5" wheels    Recommendations for Other Services       Precautions / Restrictions Precautions Precautions: None Precaution Comments: monitor O2, weaning down    Mobility  Bed Mobility Overal bed mobility: Independent                Transfers Overall transfer level: Needs assistance Equipment used: Rolling walker (2 wheeled) Transfers: Sit to/from Stand Sit to Stand: Supervision         General transfer comment: improved for sit to stand from bed and recliner.  Ambulation/Gait Ambulation/Gait assistance: Min guard;+2 safety/equipment Gait Distance (Feet): 40 Feet (x 2) Assistive device: Rolling walker (2 wheeled) Gait Pattern/deviations: Step-through pattern     General Gait Details: improved with  steadiness, manged Rw around turns and objects   Stairs             Wheelchair Mobility    Modified Rankin (Stroke Patients Only)       Balance Overall balance assessment: Mild deficits observed, not formally tested         Standing balance support: During  functional activity;Single extremity supported Standing balance-Leahy Scale: Fair Standing balance comment: with ambulation utilizes RW but able to use bilateral hands for ADLs                            Cognition Arousal/Alertness: Awake/alert Behavior During Therapy: WFL for tasks assessed/performed Overall Cognitive Status: Within Functional Limits for tasks assessed                                        Exercises General Exercises - Upper Extremity Shoulder Flexion: AROM;Strengthening;Theraband;Both;10 reps Theraband Level (Shoulder Flexion): Level 2 (Red) Shoulder Extension: Strengthening;Both;10 reps;Theraband Theraband Level (Shoulder Extension): Level 2 (Red) Shoulder Horizontal ADduction: Strengthening;Theraband;Both;10 reps;Seated Theraband Level (Shoulder Horizontal Adduction): Level 2 (Red)    General Comments        Pertinent Vitals/Pain Pain Assessment: No/denies pain Faces Pain Scale: No hurt    Home Living                      Prior Function            PT Goals (current goals can now be found in the care plan section) Acute Rehab PT Goals Patient Stated Goal: to be able to walk into her house Progress towards PT goals: Progressing  toward goals    Frequency    Min 3X/week      PT Plan Current plan remains appropriate    Co-evaluation       OT goals addressed during session: ADL's and self-care      AM-PAC PT "6 Clicks" Mobility   Outcome Measure  Help needed turning from your back to your side while in a flat bed without using bedrails?: None Help needed moving from lying on your back to sitting on the side of a flat bed without using bedrails?: None Help needed moving to and from a bed to a chair (including a wheelchair)?: A Little   Help needed to walk in hospital room?: A Little Help needed climbing 3-5 steps with a railing? : A Lot 6 Click Score: 16    End of Session Equipment Utilized During  Treatment: Oxygen Activity Tolerance: Patient tolerated treatment well Patient left: in chair;with call bell/phone within reach Nurse Communication: Mobility status PT Visit Diagnosis: Muscle weakness (generalized) (M62.81);Difficulty in walking, not elsewhere classified (R26.2)     Time: 0932-3557 PT Time Calculation (min) (ACUTE ONLY): 34 min  Charges:  $Gait Training: 8-22 mins                     Blanchard Kelch PT Acute Rehabilitation Services Pager [phone] Office [phone]    Rada Hay 08/03/2020, 4:18 PM

## 2020-08-04 DIAGNOSIS — U071 COVID-19: Secondary | ICD-10-CM | POA: Diagnosis not present

## 2020-08-04 DIAGNOSIS — E785 Hyperlipidemia, unspecified: Secondary | ICD-10-CM | POA: Diagnosis not present

## 2020-08-04 DIAGNOSIS — I480 Paroxysmal atrial fibrillation: Secondary | ICD-10-CM | POA: Diagnosis not present

## 2020-08-04 DIAGNOSIS — R739 Hyperglycemia, unspecified: Secondary | ICD-10-CM | POA: Diagnosis not present

## 2020-08-04 LAB — GLUCOSE, CAPILLARY
Glucose-Capillary: 206 mg/dL — ABNORMAL HIGH (ref 70–99)
Glucose-Capillary: 223 mg/dL — ABNORMAL HIGH (ref 70–99)
Glucose-Capillary: 149 mg/dL — ABNORMAL HIGH (ref 70–99)
Glucose-Capillary: 132 mg/dL — ABNORMAL HIGH (ref 70–99)

## 2020-08-04 MED ORDER — METHYLPREDNISOLONE SODIUM SUCC 125 MG IJ SOLR: 40.0000 mg | Freq: Every day | INTRAMUSCULAR | Status: DC

## 2020-08-04 MED ORDER — HYDROMORPHONE HCL 1 MG/ML IJ SOLN: 1.0000 mg | Freq: Once | INTRAMUSCULAR | Status: DC

## 2020-08-04 MED ORDER — METFORMIN HCL 500 MG PO TABS
500.0000 mg | ORAL_TABLET | Freq: Two times a day (BID) | ORAL | Status: DC
  Administered 2020-08-04 – 2020-08-12 (×16): 500 mg via ORAL
  Filled 2020-08-04 (×16): qty 1

## 2020-08-04 MED ORDER — INSULIN DETEMIR 100 UNIT/ML ~~LOC~~ SOLN
70.0000 [IU] | Freq: Every day | SUBCUTANEOUS | Status: DC
  Filled 2020-08-04: qty 0.7

## 2020-08-04 NOTE — Progress Notes (Signed)
PROGRESS NOTE  Penny Huerta  TIR:[id number] DOB: Jun 10, 1966 DOA: 07/26/2020 PCP: Patient, No Pcp Per Brief Narrative: Penny Huerta is a 54 y.o. female with a history of obesity, PAF, HLD, anxiety, and antigen-positive covid-19 infection diagnosed 11/9 s/p sotrovimab 11/10 who presented by EMS to the ED 11/12 with hypoxia and shortness of breath requiring nonrebreather initially, later transitioned to HFNC. CXR revealed cardiomegaly, vascular congestion, and multifocal infiltrates consistent with pulmonary edema mixed with covid-19 pneumonia. CRP 18.7, PCT negative. Lasix was given as well as remdesivir, decadron, baricitinib. Hypoxia has since improved significantly.  Assessment & Plan: Principal Problem:   Acute hypoxemic respiratory failure due to COVID-19 [facility]) Active Problems:   Hyperlipidemia   Hyperglycemia  Acute hypoxemic respiratory failure due to covid-19 pneumonia and pulmonary edema: SARS-CoV-2 Ag positive on 11/9 s/p mAb 11/10.  - Completed remdesivir x5 days (11/12 - 11/16) - Received 10 days of steroids. Will stop since hypoxia improving and hyperglycemia remains severe despite heightened insulin requirements. CRP 1.0 as of 11/18. - Continue baricitinib, started 11/12; planning 14 days Tx or can DC when stable for discharge.  Acute pulmonary edema, acute diastolic heart feailure: LVEF preserved with G1DD, plethoric IVC - Continuing lasix, strict I/O, weights. Net negative listed ~5L with wt 303 >> 292lbs.  T2DM: New diagnosis with HbA1c 7.6% with steroid-induced hyperglycemia (steroid started PTA).  - Stop steroids as above.  - Start metformin mostly to check for tolerance. Planning to start longer term [facility] as well at DC which should adequately treat hyperglycemia after steroids stopped. May need insulin for short time.  - Get diabetes coordinator for assistance with education, also consulted RD. - Continue insulins as ordered now and anticipate deescalating doses quickly.    - On linagliptin.  Hypokalemia:  - Continue supplementation  PAF: Current NSR. - Telemetry personally reviewed, remains in NSR.  Morbid obesity: Estimated body mass index is 47.18 kg/m as calculated from the following:   Height as of this encounter: 5\' 6"  (1.676 m).   Weight as of this encounter: 132.6 kg.   HLD: LDL calculated to be 266, HDL 19, and triglycerides (does not appear to be fasting study).  - Pt has listed allergy to statins. Consider repeat fasting panel, outpatient follow up/lipid clinic.  DVT prophylaxis: Lovenox 0.5mg /kg q24h Code Status: Full Family Communication: Husband by phone this PM Disposition Plan:  Status is: Inpatient  Remains inpatient appropriate because:Inpatient level of care appropriate due to severity of illness  Dispo: The patient is from: Home              Anticipated d/c is to: Home              Anticipated d/c date is: 1 day              Patient currently is not medically stable to d/c.  Consultants:   None  Procedures:   None  Antimicrobials:  Remdesivir   Subjective: Shortness of breath is present with exertion, limiting mobility, though has gradually become much better than previously. Weaning oxygen successfully. Eating well. No leg swelling, chest pain.   Objective: Vitals:   08/04/20 0500 08/04/20 0534 08/04/20 0700 08/04/20 1145  BP:  (!) 132/99  123/74  Pulse:  76  80  Resp:    14  Temp:  98 F (36.7 C)  97.8 F (36.6 C)  TempSrc:  Oral  Axillary  SpO2:  94% 93% 97%  Weight: 132.6 kg  Height:        Intake/Output Summary (Last 24 hours) at 08/04/2020 1359 Last data filed at 08/04/2020 1232 Gross per 24 hour  Intake 2740 ml  Output 1300 ml  Net 1440 ml   Filed Weights   07/31/20 0500 08/01/20 0500 08/04/20 0500  Weight: (!) 137.4 kg (!) 137.5 kg 132.6 kg   Gen: 54 y.o. female in no distress Pulm: Nonlabored and clear. CV: Regular rate and rhythm. No murmur, rub, or gallop. No JVD, no  dependent edema. GI: Abdomen soft, non-tender, non-distended, with normoactive bowel sounds.  Ext: Warm, no deformities Skin: No rashes, lesions or ulcers on visualized skin. Neuro: Alert and oriented. No focal neurological deficits. Psych: Judgement and insight appear fair. Mood euthymic & affect congruent. Behavior is appropriate.    CBC: No results for input(s): WBC, NEUTROABS, HGB, HCT, MCV, PLT in the last 168 hours. Basic Metabolic Panel: Recent Labs  Lab 07/30/20 0451 07/31/20 0441 08/01/20 0912 08/02/20 0533 08/03/20 0528  NA 138 134* 132* 129* 133*  K 3.5 3.9 3.4* 3.2* 3.4*  CL 101 96* 89* 88* 90*  CO2 25 26 27 29 29   GLUCOSE 180* 206* 238* 215* 228*  BUN 30* 35* 42* 40* 47*  CREATININE 0.63 0.68 0.72 0.63 0.78  CALCIUM 8.4* 8.6* 8.7* 8.7* 8.7*   GFR: Estimated Creatinine Clearance: 113.7 mL/min (by C-G formula based on SCr of 0.78 mg/dL). Liver Function Tests: Recent Labs  Lab 07/29/20 0429 07/30/20 0451 07/31/20 0441  AST 65* 44* 62*  ALT 59* 62* 68*  ALKPHOS 53 48 56  BILITOT 1.0 1.0 1.6*  PROT 7.0 7.6 8.1  ALBUMIN 3.0* 3.2* 3.4*   HbA1C: No results for input(s): HGBA1C in the last 72 hours. CBG: Recent Labs  Lab 08/03/20 0843 08/03/20 1202 08/03/20 1710 08/04/20 0840 08/04/20 1141  GLUCAP 251* 261* 220* 206* 223*   Lipid Profile: No results for input(s): CHOL, HDL, LDLCALC, TRIG, CHOLHDL, LDLDIRECT in the last 72 hours. Thyroid Function Tests: No results for input(s): TSH, T4TOTAL, FREET4, T3FREE, THYROIDAB in the last 72 hours. Anemia Panel: No results for input(s): VITAMINB12, FOLATE, FERRITIN, TIBC, IRON, RETICCTPCT in the last 72 hours. Urine analysis:    Component Value Date/Time   BILIRUBINUR Neg 03/10/2013 1510   PROTEINUR Neg 03/10/2013 1510   UROBILINOGEN 0.2 03/10/2013 1510   NITRITE Neg 03/10/2013 1510   LEUKOCYTESUR Negative 03/10/2013 1510   Recent Results (from the past 240 hour(s))  Blood Culture (routine x 2)      Status: None   Collection Time: 07/26/20  2:25 PM   Specimen: Site Not Specified; Blood  Result Value Ref Range Status   Specimen Description   Final    SITE NOT SPECIFIED Performed at [facility], [address], [place], [place]    Special Requests   Final    BOTTLES DRAWN AEROBIC AND ANAEROBIC Blood Culture adequate volume Performed at [facility], [address] [place], [place]    Culture   Final    NO GROWTH 5 DAYS Performed at [facility], [address], [place], [place]    Report Status 07/31/2020 FINAL  Final  Blood Culture (routine x 2)     Status: None   Collection Time: 07/26/20  2:25 PM   Specimen: Site Not Specified; Blood  Result Value Ref Range Status   Specimen Description   Final    SITE NOT SPECIFIED Performed at [facility], [address] [place], [place] [address]    Special Requests   Final    BOTTLES DRAWN AEROBIC AND ANAEROBIC Blood Culture results may not be optimal due to an inadequate volume of blood received in culture bottles Performed at [facility], [address], [place], [place] [address]    Culture   Final    NO GROWTH 5 DAYS Performed at [facility], [address], [place], [place] [address]    Report Status 07/31/2020 FINAL  Final      Radiology Studies: No results found.  Scheduled Meds: . baricitinib  4 mg Oral Daily  . enoxaparin (LOVENOX) injection  70 mg Subcutaneous Q24H  . furosemide  80 mg Intravenous BID  . insulin aspart  0-20 Units Subcutaneous TID WC  . insulin aspart  0-5 Units Subcutaneous QHS  . insulin aspart  10 Units Subcutaneous TID WC  . insulin detemir  70 Units Subcutaneous BID  . linagliptin  5 mg Oral Daily  . metFORMIN  500 mg Oral BID WC  . potassium chloride  40 mEq Oral Daily  . sodium chloride flush  3 mL Intravenous Q12H   Continuous Infusions: . sodium chloride        LOS: 9 days   Time spent: 35 minutes.  Tyrone Nine, MD [facility] [email or web address] 08/04/2020, 1:59 PM

## 2020-08-04 NOTE — Progress Notes (Signed)
NP [place] hospitalist notified chest pressure and burning after weaning oxygen from 2 L to room air. Pt sat up and oxygen turned to 2 L then 1 L and pt reports chest pressure/burning both rated at a 6-7 have completely resolved.

## 2020-08-04 NOTE — Progress Notes (Signed)
Tylenol prn given to patient for c/o right side pain. States she feels she slept on telemetry box. Tylenol managing well.

## 2020-08-04 NOTE — Progress Notes (Signed)
Occupational Therapy Treatment Patient Details Name: Penny Huerta MRN: [id number] DOB: 07-30-1966 Today's Date: 08/04/2020    History of present illness Patient is a 54 year old female admitted 11/12 PMH of obesity, atrial fibrillation, hyperlipidemia, anxiety who had progressively worsening SOB ~1 week. Patient diagnosed with COVID 11/9   OT comments  Patient supine in bed today with depressed affect. Patient reports weakness and poor endurance compared to yesterday. Patient able to ambulate to bathroom, perform toileting and stand at sink for grooming - but movements slower and conversation halted until patient seated in chair. Patient instructed on energy conservation for strategies for return home - use of shower chair, frequent rest breaks, seating surfaces spread out among the house for safety, self monitoring. Patient verbalized understanding. Patient on RA and o2  Sat maintained in the high 90s. Patient mostly limited by fatigue today.    Follow Up Recommendations  Home health OT    Equipment Recommendations  Tub/shower seat    Recommendations for Other Services      Precautions / Restrictions Precautions Precautions: None Restrictions Weight Bearing Restrictions: No       Mobility Bed Mobility Overal bed mobility: Independent                Transfers Overall transfer level: Needs assistance Equipment used: Rolling walker (2 wheeled) Transfers: Sit to/from Stand Sit to Stand: Supervision         General transfer comment: Supervision for all ambulation today with RW. Patient's gait slower today and patient reporting more fatigue.    Balance Overall balance assessment: Mild deficits observed, not formally tested                                         ADL either performed or assessed with clinical judgement   ADL       Grooming: Wash/dry hands;Standing Grooming Details (indicate cue type and reason): Stood at sink to wash hands,  reports significant fatigue and wanting to sit down before finishing conversation/education.                 Toilet Transfer: Supervision/safety;Regular Toilet;RW;Ambulation Statistician Details (indicate cue type and reason): Performed toileting with supervision. Ambulated to bathroom to perform. slow gait and transfers due to fatigue. Toileting- Clothing Manipulation and Hygiene: Supervision/safety;Sit to/from stand Toileting - Clothing Manipulation Details (indicate cue type and reason): supervision for toileting.             Vision Patient Visual Report: No change from baseline     Perception     Praxis      Cognition Arousal/Alertness: Awake/alert Behavior During Therapy: WFL for tasks assessed/performed Overall Cognitive Status: Within Functional Limits for tasks assessed                                          Exercises Other Exercises Other Exercises: Educated on Energy conseration strategies   Shoulder Instructions       General Comments      Pertinent Vitals/ Pain       Pain Assessment: No/denies pain  Home Living  Prior Functioning/Environment              Frequency  Min 2X/week        Progress Toward Goals  OT Goals(current goals can now be found in the care plan section)  Progress towards OT goals: Progressing toward goals  Acute Rehab OT Goals Patient Stated Goal: to be able to walk into her house OT Goal Formulation: With patient Time For Goal Achievement: 08/15/20 Potential to Achieve Goals: Good  Plan Discharge plan remains appropriate    Co-evaluation          OT goals addressed during session: ADL's and self-care (energy conservation)      AM-PAC OT "6 Clicks" Daily Activity     Outcome Measure   Help from another person eating meals?: None Help from another person taking care of personal grooming?: A Little Help from another person  toileting, which includes using toliet, bedpan, or urinal?: A Little Help from another person bathing (including washing, rinsing, drying)?: A Little Help from another person to put on and taking off regular upper body clothing?: A Little Help from another person to put on and taking off regular lower body clothing?: A Little 6 Click Score: 19    End of Session Equipment Utilized During Treatment: Rolling walker  OT Visit Diagnosis: Other abnormalities of gait and mobility (R26.89)   Activity Tolerance Patient limited by fatigue   Patient Left in chair;with call bell/phone within reach   Nurse Communication Mobility status        Time: 4970-2637 OT Time Calculation (min): 17 min  Charges: OT General Charges $OT Visit: 1 Visit OT Treatments $Self Care/Home Management : 8-22 mins  Waldron Session, OTR/L Acute Care Rehab Services  Office [phone] Pager: [phone]    Kelli Churn 08/04/2020, 4:21 PM

## 2020-08-05 ENCOUNTER — Inpatient Hospital Stay (HOSPITAL_COMMUNITY): Payer: 59

## 2020-08-05 DIAGNOSIS — U071 COVID-19: Secondary | ICD-10-CM | POA: Diagnosis not present

## 2020-08-05 DIAGNOSIS — E785 Hyperlipidemia, unspecified: Secondary | ICD-10-CM | POA: Diagnosis not present

## 2020-08-05 DIAGNOSIS — R739 Hyperglycemia, unspecified: Secondary | ICD-10-CM | POA: Diagnosis not present

## 2020-08-05 DIAGNOSIS — I480 Paroxysmal atrial fibrillation: Secondary | ICD-10-CM | POA: Diagnosis not present

## 2020-08-05 LAB — CBC
HCT: 43.2 % (ref 36.0–46.0)
Hemoglobin: 14.3 g/dL (ref 12.0–15.0)
MCH: 28.7 pg (ref 26.0–34.0)
MCHC: 33.1 g/dL (ref 30.0–36.0)
MCV: 86.7 fL (ref 80.0–100.0)
Platelets: 363 10*3/uL (ref 150–400)
RBC: 4.98 MIL/uL (ref 3.87–5.11)
RDW: 13.2 % (ref 11.5–15.5)
WBC: 20.1 10*3/uL — ABNORMAL HIGH (ref 4.0–10.5)
nRBC: 0 % (ref 0.0–0.2)

## 2020-08-05 LAB — COMPREHENSIVE METABOLIC PANEL
ALT: 98 U/L — ABNORMAL HIGH (ref 0–44)
AST: 64 U/L — ABNORMAL HIGH (ref 15–41)
Albumin: 3.5 g/dL (ref 3.5–5.0)
Alkaline Phosphatase: 47 U/L (ref 38–126)
Anion gap: 12 (ref 5–15)
BUN: 46 mg/dL — ABNORMAL HIGH (ref 6–20)
CO2: 27 mmol/L (ref 22–32)
Calcium: 8.7 mg/dL — ABNORMAL LOW (ref 8.9–10.3)
Chloride: 92 mmol/L — ABNORMAL LOW (ref 98–111)
Creatinine, Ser: 0.72 mg/dL (ref 0.44–1.00)
GFR, Estimated: >60 mL/min (ref >60)
Glucose, Bld: 137 mg/dL — ABNORMAL HIGH (ref 70–99)
Potassium: 3.7 mmol/L (ref 3.5–5.1)
Sodium: 131 mmol/L — ABNORMAL LOW (ref 135–145)
Total Bilirubin: 1.6 mg/dL — ABNORMAL HIGH (ref 0.3–1.2)
Total Protein: 7 g/dL (ref 6.5–8.1)

## 2020-08-05 LAB — GLUCOSE, CAPILLARY
Glucose-Capillary: 191 mg/dL — ABNORMAL HIGH (ref 70–99)
Glucose-Capillary: 161 mg/dL — ABNORMAL HIGH (ref 70–99)
Glucose-Capillary: 154 mg/dL — ABNORMAL HIGH (ref 70–99)
Glucose-Capillary: 122 mg/dL — ABNORMAL HIGH (ref 70–99)
Glucose-Capillary: 144 mg/dL — ABNORMAL HIGH (ref 70–99)
Glucose-Capillary: 163 mg/dL — ABNORMAL HIGH (ref 70–99)

## 2020-08-05 LAB — BILIRUBIN, FRACTIONATED(TOT/DIR/INDIR)
Bilirubin, Direct: 0.2 mg/dL (ref 0.0–0.2)
Indirect Bilirubin: 1.2 mg/dL — ABNORMAL HIGH (ref 0.3–0.9)
Total Bilirubin: 1.4 mg/dL — ABNORMAL HIGH (ref 0.3–1.2)

## 2020-08-05 MED ORDER — OXYCODONE-ACETAMINOPHEN 5-325 MG PO TABS
1.0000 | ORAL_TABLET | Freq: Once | ORAL | Status: AC
  Administered 2020-08-05: 1 via ORAL
  Filled 2020-08-05: qty 1

## 2020-08-05 MED ORDER — POTASSIUM CHLORIDE CRYS ER 20 MEQ PO TBCR
40.0000 meq | EXTENDED_RELEASE_TABLET | Freq: Every day | ORAL | Status: AC
  Administered 2020-08-05: 40 meq via ORAL
  Filled 2020-08-05: qty 2

## 2020-08-05 MED ORDER — SENNOSIDES-DOCUSATE SODIUM 8.6-50 MG PO TABS
1.0000 | ORAL_TABLET | Freq: Two times a day (BID) | ORAL | Status: DC
  Administered 2020-08-05 – 2020-08-10 (×5): 1 via ORAL
  Filled 2020-08-05 (×12): qty 1

## 2020-08-05 MED ORDER — KETOROLAC TROMETHAMINE 30 MG/ML IJ SOLN
30.0000 mg | Freq: Once | INTRAMUSCULAR | Status: AC
  Administered 2020-08-05: 30 mg via INTRAVENOUS
  Filled 2020-08-05: qty 1

## 2020-08-05 MED ORDER — ENSURE MAX PROTEIN PO LIQD
11.0000 [oz_av] | Freq: Two times a day (BID) | ORAL | Status: DC
  Administered 2020-08-05 – 2020-08-12 (×12): 11 [oz_av] via ORAL
  Filled 2020-08-05 (×16): qty 330

## 2020-08-05 MED ORDER — INSULIN DETEMIR 100 UNIT/ML ~~LOC~~ SOLN
50.0000 [IU] | Freq: Every day | SUBCUTANEOUS | Status: DC
  Administered 2020-08-05 – 2020-08-12 (×8): 50 [IU] via SUBCUTANEOUS
  Filled 2020-08-05 (×8): qty 0.5

## 2020-08-05 NOTE — Discharge Instructions (Signed)

## 2020-08-05 NOTE — Progress Notes (Signed)
PROGRESS NOTE  Tamekia Rotter Alfredo  ZOX:[id number] DOB: Nov 10, 1965 DOA: 07/26/2020 PCP: Patient, No Pcp Per Brief Narrative: SHANENA PELLEGRINO is a 54 y.o. female with a history of obesity, PAF, HLD, anxiety, and antigen-positive covid-19 infection diagnosed 11/9 s/p sotrovimab 11/10 who presented by EMS to the ED 11/12 with hypoxia and shortness of breath requiring nonrebreather initially, later transitioned to HFNC. CXR revealed cardiomegaly, vascular congestion, and multifocal infiltrates consistent with pulmonary edema mixed with covid-19 pneumonia. CRP 18.7, PCT negative. Lasix was given as well as remdesivir, decadron, baricitinib. Hypoxia has since improved significantly.  Assessment & Plan: Principal Problem:   Acute hypoxemic respiratory failure due to COVID-19 [facility]) Active Problems:   Hyperlipidemia   Hyperglycemia  Acute hypoxemic respiratory failure due to covid-19 pneumonia and pulmonary edema: SARS-CoV-2 Ag positive on 11/9 s/p mAb 11/10.  - Completed remdesivir x5 days (11/12 - 11/16) - Received 10 days of steroids.  - Stopped baricitinib (given 11/12 - 11/21)  - Continue 21 days isolation. OOB, IS, FV.  RUQ pain, hyperbilirubinemia: RUQ U/S with 3.2cm cholelithiasis without GB wall thickening or pericholecystic fluid. CBD 7mm. Fatty liver also noted. Symptoms more suggestive of MSK etiology, though she is at risk of biliary pain. WBC up though has gotten steroids. - Will d/w GI Re: further testing. ?HIDA.  - Fractionate bilirubin - Monitor LFTs. Will stop baricitinib with improvement in hypoxia and now mildly increased LFT elevations (doubt fulminant DILI).  - Treat constipation as below - K pad, trial toradol x1. Will avoid narcotics for now per pt preference and with possibility of HIDA today.  Acute pulmonary edema, acute diastolic heart feailure: LVEF preserved with G1DD, plethoric IVC - Appears grossly euvolemic without crackles on exam. Will stop lasix for now, continue I/O,  weights.    T2DM: New diagnosis with HbA1c 7.6% with steroid-induced hyperglycemia (steroid started PTA).  - Stopped steroids and decreased longacting insulin by half, will further reduce today with fasting CBG 137mg /dl, continue novolog.  - Started metformin mostly to check for tolerance. Though this can cause GI upset, RUQ pain is felt unlikely to be related. Planning to start longer term [facility] as well at DC which should adequately treat hyperglycemia after steroids stopped. May need insulin for short time.  - RD and diabetes coordinator consulted. - On linagliptin.  Hypokalemia:  - Continue supplementation today, stop thereafter unless repeating lasix.  PAF: Current NSR. - Telemetry personally reviewed, remains in NSR.  Morbid obesity: Estimated body mass index is 48.54 kg/m as calculated from the following:   Height as of this encounter: 5\' 6"  (1.676 m).   Weight as of this encounter: 136.4 kg.   HLD: LDL calculated to be 266, HDL 19, and triglycerides (does not appear to be fasting study).  - Pt has listed allergy to statins. Consider repeat fasting panel, outpatient follow up/lipid clinic.  DVT prophylaxis: Lovenox 0.5mg /kg q24h Code Status: Full Family Communication: Husband by phone daily Disposition Plan:  Status is: Inpatient  Remains inpatient appropriate because:Inpatient level of care appropriate due to severity of illness  Dispo: The patient is from: Home              Anticipated d/c is to: Home              Anticipated d/c date is: 1 day              Patient currently is not medically stable to d/c.  Consultants:   None  Procedures:  None  Antimicrobials:  Remdesivir   Subjective: Feels severe (8/10) constant, waxing, waning, nonradiating pain in the RUQ worse with any movement and with cough and with any palpation that started without notable provocation yesterday morning. No history of the same. No nausea, vomiting, diarrhea. Has not had BM for some  time. This is not any worse with food.  Objective: Vitals:   08/04/20 2051 08/04/20 2207 08/05/20 0427 08/05/20 0600  BP: 133/81 112/75 119/78   Pulse: 83 87 84   Resp: 14 18 18    Temp: 98.3 F (36.8 C) 98 F (36.7 C) 98.2 F (36.8 C)   TempSrc: Oral Oral Oral   SpO2: 93% 94% 96%   Weight:    (!) 136.4 kg  Height:        Intake/Output Summary (Last 24 hours) at 08/05/2020 0845 Last data filed at 08/04/2020 1850 Gross per 24 hour  Intake 1240 ml  Output 1200 ml  Net 40 ml   Filed Weights   08/01/20 0500 08/04/20 0500 08/05/20 0600  Weight: (!) 137.5 kg 132.6 kg (!) 136.4 kg   Gen: 54 y.o. female in no distress Pulm: Nonlabored breathing room air. Clear. CV: Regular rate and rhythm. No murmur, rub, or gallop. UTD JVD, no pitting dependent edema. GI: Abdomen soft, non-distended, with normoactive bowel sounds. +Tenderness to light palpation  along RUQ extending to epigastrium without rebound or rigidity. Pain limits murphy sign assessment, though not felt to be positive at this time. Ext: Warm, no deformities Skin: No rashes, lesions or ulcers on visualized skin. Neuro: Alert and oriented. No focal neurological deficits. Psych: Judgement and insight appear fair. Mood euthymic & affect congruent. Behavior is appropriate.    CBC: Recent Labs  Lab 08/05/20 0442  WBC 20.1*  HGB 14.3  HCT 43.2  MCV 86.7  PLT 363   Basic Metabolic Panel: Recent Labs  Lab 07/31/20 0441 08/01/20 0912 08/02/20 0533 08/03/20 0528 08/05/20 0442  NA 134* 132* 129* 133* 131*  K 3.9 3.4* 3.2* 3.4* 3.7  CL 96* 89* 88* 90* 92*  CO2 26 27 29 29 27   GLUCOSE 206* 238* 215* 228* 137*  BUN 35* 42* 40* 47* 46*  CREATININE 0.68 0.72 0.63 0.78 0.72  CALCIUM 8.6* 8.7* 8.7* 8.7* 8.7*   GFR: Estimated Creatinine Clearance: 115.7 mL/min (by C-G formula based on SCr of 0.72 mg/dL). Liver Function Tests: Recent Labs  Lab 07/30/20 0451 07/31/20 0441 08/05/20 0442  AST 44* 62* 64*  ALT 62* 68*  98*  ALKPHOS 48 56 47  BILITOT 1.0 1.6* 1.6*  PROT 7.6 8.1 7.0  ALBUMIN 3.2* 3.4* 3.5   HbA1C: No results for input(s): HGBA1C in the last 72 hours. CBG: Recent Labs  Lab 08/03/20 1710 08/04/20 0840 08/04/20 1141 08/04/20 1633 08/04/20 2145  GLUCAP 220* 206* 223* 149* 132*   Lipid Profile: No results for input(s): CHOL, HDL, LDLCALC, TRIG, CHOLHDL, LDLDIRECT in the last 72 hours. Thyroid Function Tests: No results for input(s): TSH, T4TOTAL, FREET4, T3FREE, THYROIDAB in the last 72 hours. Anemia Panel: No results for input(s): VITAMINB12, FOLATE, FERRITIN, TIBC, IRON, RETICCTPCT in the last 72 hours. Urine analysis:    Component Value Date/Time   BILIRUBINUR Neg 03/10/2013 1510   PROTEINUR Neg 03/10/2013 1510   UROBILINOGEN 0.2 03/10/2013 1510   NITRITE Neg 03/10/2013 1510   LEUKOCYTESUR Negative 03/10/2013 1510   Recent Results (from the past 240 hour(s))  Blood Culture (routine x 2)     Status: None  Collection Time: 07/26/20  2:25 PM   Specimen: Site Not Specified; Blood  Result Value Ref Range Status   Specimen Description   Final    SITE NOT SPECIFIED Performed at [facility], [address], [place], [place] [address]    Special Requests   Final    BOTTLES DRAWN AEROBIC AND ANAEROBIC Blood Culture adequate volume Performed at [facility], [address] [place], [place] [address]    Culture   Final    NO GROWTH 5 DAYS Performed at [facility], [address], [place], [place] [address]    Report Status 07/31/2020 FINAL  Final  Blood Culture (routine x 2)     Status: None   Collection Time: 07/26/20  2:25 PM   Specimen: Site Not Specified; Blood  Result Value Ref Range Status   Specimen Description   Final    SITE NOT SPECIFIED Performed at [facility], [address], [place], [place] [address]    Special Requests   Final    BOTTLES DRAWN AEROBIC AND ANAEROBIC Blood Culture results  may not be optimal due to an inadequate volume of blood received in culture bottles Performed at [facility], [address], [place], [place] [address]    Culture   Final    NO GROWTH 5 DAYS Performed at [facility], [address] [place], [place] [address]    Report Status 07/31/2020 FINAL  Final      Radiology Studies: US Abdomen Limited RUQ (LIVER/GB)  Result Date: 08/05/2020 CLINICAL DATA:  Right upper quadrant pain for 1 day. EXAM: ULTRASOUND ABDOMEN LIMITED RIGHT UPPER QUADRANT COMPARISON:  None. FINDINGS: Gallbladder: Cholelithiasis with large gallstone measuring 3.2 cm in diameter. No gallbladder wall thickening or edema. Murphy's sign is negative. Common bile duct: Diameter: 4 mm, normal. The bile duct is not visualized in its entirety. Liver: Diffusely increased and heterogeneous appearance of the liver echotexture likely representing diffuse fatty infiltration. No focal lesions identified. Portal vein is patent on color Doppler imaging with normal direction of blood flow towards the liver. Other: None. IMPRESSION: 1. Cholelithiasis without evidence of acute cholecystitis. 2. Diffuse fatty infiltration of the liver. Electronically Signed   By: Burman Nieves M.D.   On: 08/05/2020 06:58    Scheduled Meds: . enoxaparin (LOVENOX) injection  70 mg Subcutaneous Q24H  . insulin aspart  0-20 Units Subcutaneous TID WC  . insulin aspart  0-5 Units Subcutaneous QHS  . insulin aspart  10 Units Subcutaneous TID WC  . insulin detemir  50 Units Subcutaneous Daily  . ketorolac  30 mg Intravenous Once  . linagliptin  5 mg Oral Daily  . metFORMIN  500 mg Oral BID WC  . potassium chloride  40 mEq Oral Daily  . senna-docusate  1 tablet Oral BID  . sodium chloride flush  3 mL Intravenous Q12H   Continuous Infusions: . sodium chloride       LOS: 10 days   Time spent: 35 minutes.  Tyrone Nine, MD [facility] [email or web address] 08/05/2020, 8:45 AM

## 2020-08-05 NOTE — Plan of Care (Signed)
  Problem: Education: Goal: Knowledge of risk factors and measures for prevention of condition will improve Outcome: Progressing   Problem: Coping: Goal: Psychosocial and spiritual needs will be supported Outcome: Progressing   Problem: Respiratory: Goal: Will maintain a patent airway Outcome: Progressing Goal: Complications related to the disease process, condition or treatment will be avoided or minimized Outcome: Progressing   Problem: Education: Goal: Ability to describe self-care measures that may prevent or decrease complications (Diabetes Survival Skills Education) will improve Outcome: Progressing   Problem: Coping: Goal: Ability to adjust to condition or change in health will improve Outcome: Progressing   Problem: Fluid Volume: Goal: Ability to maintain a balanced intake and output will improve Outcome: Progressing   Problem: Nutritional: Goal: Maintenance of adequate nutrition will improve Outcome: Progressing Goal: Progress toward achieving an optimal weight will improve Outcome: Progressing

## 2020-08-05 NOTE — Progress Notes (Signed)
Initial Nutrition Assessment  DOCUMENTATION CODES:   Morbid obesity  INTERVENTION:   -Ensure MAX Protein po BID, each supplement provides 150 kcal and 30 grams of protein -Carbohydrate Counting handout in discharge instructions  NUTRITION DIAGNOSIS:   Increased nutrient needs related to acute illness (COVID-19 infection) as evidenced by estimated needs.  GOAL:   Patient will meet greater than or equal to 90% of their needs  MONITOR:   PO intake, Supplement acceptance, Labs, Weight trends, I & O's  REASON FOR ASSESSMENT:   Consult Diet education  ASSESSMENT:   54 y.o. female with a history of obesity, PAF, HLD, anxiety, and antigen-positive covid-19 infection diagnosed 11/9 s/p sotrovimab 11/10 who presented by EMS to the ED 11/12 with hypoxia and shortness of breath requiring nonrebreather initially, later transitioned to HFNC. CXR revealed cardiomegaly, vascular congestion, and multifocal infiltrates consistent with pulmonary edema mixed with covid-19 pneumonia.  Patient admitted on 11/12 for COVID-19 pneumonia.  Consulted for diet education regarding diabetes. Per chart review, pt was seen by DM coordinator and pt was told of her diabetes diagnosis. Pt stated then that she has been following a keto diet.   Pt has not been eating today. Medicated d/t abdominal pain. Will order Ensure Max supplements. Will attempt education at a later date. Will place carbohydrate counting information in discharge instructions.   Admission weight: 300 lbs. Current weight: 300 lbs.  Medications: KLOR-CON, Senokot  Labs reviewed: CBGs; 122-154 Low Na  NUTRITION - FOCUSED PHYSICAL EXAM:  Deferred.  Diet Order:   Diet Order            Diet heart healthy/carb modified Room service appropriate? Yes; Fluid consistency: Thin  Diet effective now                 EDUCATION NEEDS:   Not appropriate for education at this time  Skin:  Skin Assessment: Reviewed RN Assessment  Last  BM:  11/20  Height:   Ht Readings from Last 1 Encounters:  07/26/20 5\' 6"  (1.676 m)    Weight:   Wt Readings from Last 1 Encounters:  08/05/20 (!) 136.4 kg    BMI:  Body mass index is 48.54 kg/m.  Estimated Nutritional Needs:   Kcal:  1950-2150  Protein:  85-100g  Fluid:  2L/day   08/07/20, MS, RD, LDN Inpatient Clinical Dietitian Contact information available via Amion

## 2020-08-05 NOTE — Progress Notes (Signed)
PT Cancellation Note  Patient Details Name: Penny Huerta MRN: [id number] DOB: 1966/04/06   Cancelled Treatment:    Reason Eval/Treat Not Completed: Pain limiting ability to participate;Fatigue/lethargy limiting ability to participate, now having abd pain and has been medicated. Will check another day.   Rada Hay 08/05/2020, 11:30 AM Blanchard Kelch PT Acute Rehabilitation Services Pager [phone] Office [phone]

## 2020-08-06 ENCOUNTER — Inpatient Hospital Stay (HOSPITAL_COMMUNITY): Payer: 59

## 2020-08-06 DIAGNOSIS — I480 Paroxysmal atrial fibrillation: Secondary | ICD-10-CM | POA: Diagnosis not present

## 2020-08-06 DIAGNOSIS — E785 Hyperlipidemia, unspecified: Secondary | ICD-10-CM | POA: Diagnosis not present

## 2020-08-06 DIAGNOSIS — U071 COVID-19: Secondary | ICD-10-CM | POA: Diagnosis not present

## 2020-08-06 DIAGNOSIS — R739 Hyperglycemia, unspecified: Secondary | ICD-10-CM | POA: Diagnosis not present

## 2020-08-06 LAB — CBC WITH DIFFERENTIAL/PLATELET
Abs Immature Granulocytes: 0.19 10*3/uL — ABNORMAL HIGH (ref 0.00–0.07)
Basophils Absolute: 0 10*3/uL (ref 0.0–0.1)
Basophils Relative: 0 %
Eosinophils Absolute: 0 10*3/uL (ref 0.0–0.5)
Eosinophils Relative: 0 %
HCT: 37.6 % (ref 36.0–46.0)
Hemoglobin: 12.7 g/dL (ref 12.0–15.0)
Immature Granulocytes: 1 %
Lymphocytes Relative: 14 %
Lymphs Abs: 3.5 10*3/uL (ref 0.7–4.0)
MCH: 28.7 pg (ref 26.0–34.0)
MCHC: 33.8 g/dL (ref 30.0–36.0)
MCV: 85.1 fL (ref 80.0–100.0)
Monocytes Absolute: 1.9 10*3/uL — ABNORMAL HIGH (ref 0.1–1.0)
Monocytes Relative: 8 %
Neutro Abs: 18.4 10*3/uL — ABNORMAL HIGH (ref 1.7–7.7)
Neutrophils Relative %: 77 %
Platelets: 339 10*3/uL (ref 150–400)
RBC: 4.42 MIL/uL (ref 3.87–5.11)
RDW: 13.2 % (ref 11.5–15.5)
WBC: 24 10*3/uL — ABNORMAL HIGH (ref 4.0–10.5)
nRBC: 0 % (ref 0.0–0.2)

## 2020-08-06 LAB — COMPREHENSIVE METABOLIC PANEL WITH GFR
ALT: 75 U/L — ABNORMAL HIGH (ref 0–44)
AST: 37 U/L (ref 15–41)
Albumin: 3.4 g/dL — ABNORMAL LOW (ref 3.5–5.0)
Alkaline Phosphatase: 49 U/L (ref 38–126)
Anion gap: 13 (ref 5–15)
BUN: 44 mg/dL — ABNORMAL HIGH (ref 6–20)
CO2: 25 mmol/L (ref 22–32)
Calcium: 9.1 mg/dL (ref 8.9–10.3)
Chloride: 90 mmol/L — ABNORMAL LOW (ref 98–111)
Creatinine, Ser: 0.77 mg/dL (ref 0.44–1.00)
GFR, Estimated: >60 mL/min
Glucose, Bld: 214 mg/dL — ABNORMAL HIGH (ref 70–99)
Potassium: 4.5 mmol/L (ref 3.5–5.1)
Sodium: 128 mmol/L — ABNORMAL LOW (ref 135–145)
Total Bilirubin: 1.6 mg/dL — ABNORMAL HIGH (ref 0.3–1.2)
Total Protein: 6.9 g/dL (ref 6.5–8.1)

## 2020-08-06 LAB — GLUCOSE, CAPILLARY
Glucose-Capillary: 230 mg/dL — ABNORMAL HIGH (ref 70–99)
Glucose-Capillary: 217 mg/dL — ABNORMAL HIGH (ref 70–99)
Glucose-Capillary: 185 mg/dL — ABNORMAL HIGH (ref 70–99)
Glucose-Capillary: 207 mg/dL — ABNORMAL HIGH (ref 70–99)

## 2020-08-06 MED ORDER — ONDANSETRON HCL 4 MG PO TABS
4.0000 mg | ORAL_TABLET | Freq: Three times a day (TID) | ORAL | Status: DC
  Administered 2020-08-06 – 2020-08-12 (×18): 4 mg via ORAL
  Filled 2020-08-06 (×19): qty 1

## 2020-08-06 MED ORDER — CLONAZEPAM 0.125 MG PO TBDP
0.5000 mg | ORAL_TABLET | Freq: Once | ORAL | Status: AC | PRN
  Administered 2020-08-06: 0.5 mg via ORAL
  Filled 2020-08-06: qty 4

## 2020-08-06 MED ORDER — POLYETHYLENE GLYCOL 3350 17 G PO PACK
17.0000 g | PACK | Freq: Every day | ORAL | Status: DC
  Administered 2020-08-06: 17 g via ORAL
  Filled 2020-08-06 (×4): qty 1

## 2020-08-06 NOTE — Progress Notes (Signed)
Pt is off of unit for MRI, noted at this time.

## 2020-08-06 NOTE — Progress Notes (Signed)
Pt c/o abdominal pain and refused pain medication, noted at this time. Pt took PRN meds for the MRI schedule appointment and zofran, noted.

## 2020-08-06 NOTE — Progress Notes (Signed)
MRI team called at this time and confirmed that pt will have the MRI done at 4pm and they will call 30 minutes before pick up.

## 2020-08-06 NOTE — Progress Notes (Signed)
PROGRESS NOTE  Penny Huerta  UUE:[id number] DOB: [birth year] DOA: 07/26/2020 PCP: Patient, No Pcp Per Brief Narrative: Penny Huerta is a 54 y.o. female with a history of obesity, PAF, HLD, anxiety, and antigen-positive covid-19 infection diagnosed 11/9 s/p sotrovimab 11/10 who presented by EMS to the ED 11/12 with hypoxia and shortness of breath requiring nonrebreather initially, later transitioned to HFNC. CXR revealed cardiomegaly, vascular congestion, and multifocal infiltrates consistent with pulmonary edema mixed with covid-19 pneumonia. CRP 18.7, PCT negative. Lasix was given as well as remdesivir, decadron, baricitinib. Hypoxia has since improved significantly. RUQ abdominal pain developed for which work up is ongoing.  Assessment & Plan: Principal Problem:   Acute hypoxemic respiratory failure due to COVID-19 [facility]) Active Problems:   Hyperlipidemia   Hyperglycemia  Acute hypoxemic respiratory failure due to covid-19 pneumonia and pulmonary edema: SARS-CoV-2 Ag positive on 11/9 s/p mAb 11/10.  - Completed remdesivir x5 days (11/12 - 11/16) - Received 10 days of steroids.  - Stopped baricitinib (given 11/12 - 11/21)  - Continue 21 days isolation. OOB, IS, FV.  RUQ pain, hyperbilirubinemia: RUQ U/S with 3.2cm cholelithiasis without GB wall thickening or pericholecystic fluid. CBD 19mm. Fatty liver also noted. Symptoms more suggestive of MSK etiology, though she is at risk of biliary pain. WBC up though has gotten steroids. - D/w GI, Dr. Barron Alvine by phone. Will evaluate further with MRCP. This was delayed for unclear reasons 11/22. We have contacted MRI department to confirm she will have this study today. Will make NPO after her breakfast, though she's not eating very much regardless. - Indirect hyperbilirubinemia could be manifestation of Gilbert's. It is stable. - Treating constipation, LBM stated to be 11/17. Senna BID, miralax daily. - K pad, toradol prn.  Acute pulmonary edema,  acute diastolic heart feailure: LVEF preserved with G1DD, plethoric IVC - Appears grossly euvolemic without crackles on exam. Stopped lasix for now. - Continue I/O, weights.    T2DM: New diagnosis with HbA1c 7.6% with steroid-induced hyperglycemia (steroid started PTA).  - Stopped steroids and decreased longacting insulin. CBGs remain at a level that would suggest insulin will be required at discharge despite discontinuing steroids. - Started metformin. Though this can cause GI upset, RUQ pain is felt unlikely to be related. - RD and diabetes coordinator consulted. - On linagliptin.  Hypokalemia:  - Improved with supplementation.  PAF: Current NSR. - Telemetry personally reviewed, remains in NSR.  Morbid obesity: Estimated body mass index is 48.57 kg/m as calculated from the following:   Height as of this encounter: 5\' 6"  (1.676 m).   Weight as of this encounter: 136.5 kg.   HLD: LDL calculated to be 266, HDL 19, and triglycerides (does not appear to be fasting study).  - Pt has listed allergy to statins. Consider repeat fasting panel, outpatient follow up/lipid clinic.  DVT prophylaxis: Lovenox 0.5mg /kg q24h Code Status: Full Family Communication: Husband by phone daily Disposition Plan:  Status is: Inpatient  Remains inpatient appropriate because:Ongoing diagnostic testing needed not appropriate for outpatient work up and Inpatient level of care appropriate due to severity of illness  Dispo: The patient is from: Home              Anticipated d/c is to: Home              Anticipated d/c date is: 1 day              Patient currently is not medically stable to d/c.  Consultants:  None  Procedures:   None  Antimicrobials:  Remdesivir   Subjective: Continues to feel short of breath mostly associated with cough which prompts severe nausea and severe light-headedness that improves with fan on face and resting. No chest pain. RUQ pain is stable, not necessarily worse  with food though she's eating very little.   Objective: Vitals:   08/06/20 0300 08/06/20 0400 08/06/20 0500 08/06/20 0600  BP:   111/65   Pulse:   99   Resp: 18 17 13 17   Temp:   98.2 F (36.8 C)   TempSrc:   Oral   SpO2:   99%   Weight:   (!) 136.5 kg   Height:        Intake/Output Summary (Last 24 hours) at 08/06/2020 1159 Last data filed at 08/05/2020 2220 Gross per 24 hour  Intake --  Output 400 ml  Net -400 ml   Filed Weights   08/04/20 0500 08/05/20 0600 08/06/20 0500  Weight: 132.6 kg (!) 136.4 kg (!) 136.5 kg   Gen: 54 y.o. female in no distress Pulm: Nonlabored breathing room air. Clear anteriorly. CV: Regular rate and rhythm. No murmur, rub, or gallop. UTD JVD, no pitting dependent edema. GI: Abdomen soft, tender in RUQ without rebound, +BS.  Ext: Warm, no deformities Skin: No rashes, lesions or ulcers on visualized skin. No overlying erythema on abdominal site. Neuro: Alert and oriented. No focal neurological deficits. Psych: Judgement and insight appear fair. Mood euthymic & affect congruent. Behavior is appropriate.    CBC: Recent Labs  Lab 08/05/20 0442 08/06/20 0429  WBC 20.1* 24.0*  NEUTROABS  --  18.4*  HGB 14.3 12.7  HCT 43.2 37.6  MCV 86.7 85.1  PLT 363 339   Basic Metabolic Panel: Recent Labs  Lab 08/01/20 0912 08/02/20 0533 08/03/20 0528 08/05/20 0442 08/06/20 0429  NA 132* 129* 133* 131* 128*  K 3.4* 3.2* 3.4* 3.7 4.5  CL 89* 88* 90* 92* 90*  CO2 27 29 29 27 25   GLUCOSE 238* 215* 228* 137* 214*  BUN 42* 40* 47* 46* 44*  CREATININE 0.72 0.63 0.78 0.72 0.77  CALCIUM 8.7* 8.7* 8.7* 8.7* 9.1   GFR: Estimated Creatinine Clearance: 115.8 mL/min (by C-G formula based on SCr of 0.77 mg/dL). Liver Function Tests: Recent Labs  Lab 07/31/20 0441 08/05/20 0442 08/06/20 0429  AST 62* 64* 37  ALT 68* 98* 75*  ALKPHOS 56 47 49  BILITOT 1.6* 1.4*  1.6* 1.6*  PROT 8.1 7.0 6.9  ALBUMIN 3.4* 3.5 3.4*   HbA1C: No results for  input(s): HGBA1C in the last 72 hours. CBG: Recent Labs  Lab 08/05/20 1146 08/05/20 1727 08/05/20 2004 08/06/20 0806 08/06/20 1149  GLUCAP 122* 144* 163* 230* 217*   Lipid Profile: No results for input(s): CHOL, HDL, LDLCALC, TRIG, CHOLHDL, LDLDIRECT in the last 72 hours. Thyroid Function Tests: No results for input(s): TSH, T4TOTAL, FREET4, T3FREE, THYROIDAB in the last 72 hours. Anemia Panel: No results for input(s): VITAMINB12, FOLATE, FERRITIN, TIBC, IRON, RETICCTPCT in the last 72 hours. Urine analysis:    Component Value Date/Time   BILIRUBINUR Neg 03/10/2013 1510   PROTEINUR Neg 03/10/2013 1510   UROBILINOGEN 0.2 03/10/2013 1510   NITRITE Neg 03/10/2013 1510   LEUKOCYTESUR Negative 03/10/2013 1510   No results found for this or any previous visit (from the past 240 hour(s)).    Radiology Studies: 03/12/2013 Abdomen Limited RUQ (LIVER/GB)  Result Date: 08/05/2020 CLINICAL DATA:  Right upper quadrant pain for  1 day. EXAM: ULTRASOUND ABDOMEN LIMITED RIGHT UPPER QUADRANT COMPARISON:  None. FINDINGS: Gallbladder: Cholelithiasis with large gallstone measuring 3.2 cm in diameter. No gallbladder wall thickening or edema. Murphy's sign is negative. Common bile duct: Diameter: 4 mm, normal. The bile duct is not visualized in its entirety. Liver: Diffusely increased and heterogeneous appearance of the liver echotexture likely representing diffuse fatty infiltration. No focal lesions identified. Portal vein is patent on color Doppler imaging with normal direction of blood flow towards the liver. Other: None. IMPRESSION: 1. Cholelithiasis without evidence of acute cholecystitis. 2. Diffuse fatty infiltration of the liver. Electronically Signed   By: Burman Nieves M.D.   On: 08/05/2020 06:58    Scheduled Meds: . enoxaparin (LOVENOX) injection  70 mg Subcutaneous Q24H  . insulin aspart  0-20 Units Subcutaneous TID WC  . insulin aspart  0-5 Units Subcutaneous QHS  . insulin aspart  10 Units  Subcutaneous TID WC  . insulin detemir  50 Units Subcutaneous Daily  . linagliptin  5 mg Oral Daily  . metFORMIN  500 mg Oral BID WC  . ondansetron  4 mg Oral TID  . polyethylene glycol  17 g Oral Daily  . Ensure Max Protein  11 oz Oral BID  . senna-docusate  1 tablet Oral BID  . sodium chloride flush  3 mL Intravenous Q12H   Continuous Infusions: . sodium chloride       LOS: 11 days   Time spent: 35 minutes.  Tyrone Nine, MD [facility] [email or web address] 08/06/2020, 11:59 AM

## 2020-08-06 NOTE — Progress Notes (Signed)
Failed attempt at MRCP. Patient was medicated prior to MRI; however, she was still unable to tolerate the procedure. Patient very clearly stated to two MRI staff members, " I cannot continue." Very limited MRI of the abdomen completed.

## 2020-08-06 NOTE — Plan of Care (Signed)
  Problem: Education: Goal: Knowledge of risk factors and measures for prevention of condition will improve Outcome: Progressing   Problem: Coping: Goal: Psychosocial and spiritual needs will be supported Outcome: Progressing   Problem: Respiratory: Goal: Will maintain a patent airway Outcome: Progressing Goal: Complications related to the disease process, condition or treatment will be avoided or minimized Outcome: Progressing   Problem: Education: Goal: Ability to describe self-care measures that may prevent or decrease complications (Diabetes Survival Skills Education) will improve Outcome: Progressing   Problem: Coping: Goal: Ability to adjust to condition or change in health will improve Outcome: Progressing   Problem: Fluid Volume: Goal: Ability to maintain a balanced intake and output will improve Outcome: Progressing   Problem: Nutritional: Goal: Maintenance of adequate nutrition will improve Outcome: Progressing Goal: Progress toward achieving an optimal weight will improve Outcome: Progressing   

## 2020-08-06 NOTE — Progress Notes (Signed)
Physical Therapy Treatment Patient Details Name: Penny Huerta MRN: [id number] DOB: 1966/05/04 Today's Date: 08/06/2020    History of Present Illness Patient is a 54 year old female admitted 11/12 PMH of obesity, atrial fibrillation, hyperlipidemia, anxiety who had progressively worsening SOB ~1 week. Patient diagnosed with COVID 11/9    PT Comments    The patient reports abd pain is 8. Patient did roll and sit on bed edge for ~ 1 minute. Patient on RA, SAts >90%. Continue mobility as tolerated once abd is less.   Follow Up Recommendations  Home health PT     Equipment Recommendations  Rolling walker with 5" wheels    Recommendations for Other Services       Precautions / Restrictions Precautions Precautions: Fall    Mobility  Bed Mobility Overal bed mobility: Needs Assistance Bed Mobility: Rolling;Sidelying to Sit;Sit to Sidelying Rolling: Mod assist Sidelying to sit: Mod assist;HOB elevated     Sit to sidelying: Mod assist General bed mobility comments: to left with bed pad, pain limiting, use of bed rail to sit from right sidelying then back to side.  Transfers                 General transfer comment: NT  Ambulation/Gait                 Stairs             Wheelchair Mobility    Modified Rankin (Stroke Patients Only)       Balance   Sitting-balance support: Feet supported;Bilateral upper extremity supported Sitting balance-Leahy Scale: Fair                                      Cognition Arousal/Alertness: Awake/alert Behavior During Therapy: WFL for tasks assessed/performed                                   General Comments: appears in distress      Exercises      General Comments        Pertinent Vitals/Pain Pain Assessment: Faces Pain Score: 8  Pain Location: right lower quadrant Pain Descriptors / Indicators: Jabbing;Moaning;Cramping;Grimacing;Guarding Pain Intervention(s):  Monitored during session;Premedicated before session;Limited activity within patient's tolerance    Home Living                      Prior Function            PT Goals (current goals can now be found in the care plan section) Progress towards PT goals: Progressing toward goals    Frequency    Min 3X/week      PT Plan Current plan remains appropriate    Co-evaluation              AM-PAC PT "6 Clicks" Mobility   Outcome Measure  Help needed turning from your back to your side while in a flat bed without using bedrails?: A Lot Help needed moving from lying on your back to sitting on the side of a flat bed without using bedrails?: A Lot Help needed moving to and from a bed to a chair (including a wheelchair)?: A Lot Help needed standing up from a chair using your arms (e.g., wheelchair or bedside chair)?: A Lot Help needed to walk in hospital room?: A  Lot Help needed climbing 3-5 steps with a railing? : A Lot 6 Click Score: 12    End of Session   Activity Tolerance: Patient limited by pain Patient left: in bed;with call bell/phone within reach Nurse Communication: Mobility status PT Visit Diagnosis: Muscle weakness (generalized) (M62.81);Difficulty in walking, not elsewhere classified (R26.2)     Time: 5189-8421 PT Time Calculation (min) (ACUTE ONLY): 24 min  Charges:  $Therapeutic Activity: 23-37 mins                     Blanchard Kelch PT Acute Rehabilitation Services Pager [phone] Office [phone]    Rada Hay 08/06/2020, 3:19 PM

## 2020-08-06 NOTE — Progress Notes (Signed)
Inpatient Diabetes Program Recommendations  AACE/ADA: New Consensus Statement on Inpatient Glycemic Control (2015)  Target Ranges:  Prepandial:   less than 140 mg/dL      Peak postprandial:   less than 180 mg/dL (1-2 hours)      Critically ill patients:  140 - 180 mg/dL   Lab Results  Component Value Date   GLUCAP 217 (H) 08/06/2020   HGBA1C 7.6 (H) 07/26/2020    Review of Glycemic Control Results for SHERINE, CORTESE (MRN [id number]) as of 08/06/2020 12:48  Ref. Range 08/05/2020 11:46 08/05/2020 17:27 08/05/2020 20:04 08/06/2020 08:06 08/06/2020 11:49  Glucose-Capillary Latest Ref Range: 70 - 99 mg/dL 308 (H) 657 (H) 846 (H) 230 (H) 217 (H)   Diabetes history:  T2DM Outpatient Diabetes medications: 70/30 20 units bid Current orders for Inpatient glycemic control:  Levemir 50 units daily Novolog 0-20 units tid Novolog 0-5 units qhs Novolog 10 units tid Tradjenta 5 mg daily  Inpatient Diabetes Program Recommendations:     Levemir 55 units daily  Will continue to follow while inpatient.  Thank you, Dulce Sellar, RN, BSN Diabetes Coordinator Inpatient Diabetes Program [phone] (team pager from 8a-5p)

## 2020-08-06 NOTE — Progress Notes (Signed)
Pt will be NPO at 10Am this morning for MRI today according to MRI team, noted. Patient and MD notified.

## 2020-08-06 NOTE — Progress Notes (Signed)
Pt arrive back to unit from MRI, noted.

## 2020-08-07 DIAGNOSIS — R739 Hyperglycemia, unspecified: Secondary | ICD-10-CM | POA: Diagnosis not present

## 2020-08-07 DIAGNOSIS — U071 COVID-19: Secondary | ICD-10-CM | POA: Diagnosis not present

## 2020-08-07 DIAGNOSIS — E785 Hyperlipidemia, unspecified: Secondary | ICD-10-CM | POA: Diagnosis not present

## 2020-08-07 DIAGNOSIS — J9601 Acute respiratory failure with hypoxia: Secondary | ICD-10-CM | POA: Diagnosis not present

## 2020-08-07 LAB — GLUCOSE, CAPILLARY
Glucose-Capillary: 153 mg/dL — ABNORMAL HIGH (ref 70–99)
Glucose-Capillary: 180 mg/dL — ABNORMAL HIGH (ref 70–99)
Glucose-Capillary: 132 mg/dL — ABNORMAL HIGH (ref 70–99)
Glucose-Capillary: 126 mg/dL — ABNORMAL HIGH (ref 70–99)

## 2020-08-07 MED ORDER — SODIUM CHLORIDE 0.9 % IV BOLUS
500.0000 mL | Freq: Once | INTRAVENOUS | Status: AC
  Administered 2020-08-07: 500 mL via INTRAVENOUS

## 2020-08-07 NOTE — Progress Notes (Signed)
Notified by Radiologist of critical MRCP results showingLarge intramuscular hematoma approximately 13 x 12 x 7 cm with edema in the body wall around the hematoma. Lesion could not be excluded. Mild pancreatitis and cholelithiasis without cholecystitis or biliary duct dilation. Patient remained asymptomatic throughout this shift. Anticoagulation held for now but may need to be resumed with resolution of hematoma.     Webb Silversmith, BSN, MSN, DNP, [employer]  [facility] Nurse Practitioner  [place] [facility]

## 2020-08-07 NOTE — Progress Notes (Addendum)
Physical Therapy Treatment Patient Details Name: Penny Huerta MRN: [id number] DOB: September 20, 1965 Today's Date: 08/07/2020    History of Present Illness Patient is a 54 year old female admitted 11/12 PMH of obesity, atrial fibrillation, hyperlipidemia, anxiety who had progressively worsening SOB ~1 week. Patient diagnosed with COVID 11/9. Patient began complaining of abdominal pain and found to have large intramuscular hematoma.    PT Comments    Patient reports dizziness when sitting upright. Patient sat on bed edge, orthostatic BP's checked. While standing  Patient became less alert, eyes rolled back , assisted patient back into bed with patient becoming alert within <1 minute after  Episode. See Doc flow sheets for BP's. RN called to room. MD notified .   patient was progressing well through this past weekend, ambulating on RA with RW.Marland KitchenPatient has not been  Up and Out of bed  X 3 days after right abdominal pain began. Na+ 128. encouraged patient to hydrate, reports not eating very much either.   Continue PT to progress activity.  SPO2 on RA 95%, HR 101   Follow Up Recommendations  Home health PT     Equipment Recommendations  Rolling walker with 5" wheels    Recommendations for Other Services       Precautions / Restrictions Precautions Precautions: Fall Precaution Comments: Orthostatic Restrictions Weight Bearing Restrictions: No    Mobility  Bed Mobility Overal bed mobility: Needs Assistance Bed Mobility: Rolling;Supine to Sit;Sit to Supine Rolling: Supervision   Supine to sit: Supervision Sit to supine: Supervision   General bed mobility comments: Use of bed rails and increased time for bed mobility. With BP eopisode required +2 to return to supine safely.  Transfers Overall transfer level: Needs assistance Equipment used: Rolling walker (2 wheeled) Transfers: Sit to/from Stand Sit to Stand: Min assist;+2 safety/equipment         General transfer comment:  Patient reports dizziness prior to movement. Increased with sitting. BP 98/64 in sitting. Performed standing with +2 assistance for safety. Patient unable to maintain prolonged standing stating "I have to sit down" (approx 30 seconds). Returned to seated position before BP completed - in seated position exhibited staring off into space and began to pass out losing trunk control. Patient total to return to supine due to decreased consciousness. Recoverd quickly back in supine. Incomplete standing BP 87/48. Return to supine BP 112/72.  Ambulation/Gait             General Gait Details: unable   Stairs             Wheelchair Mobility    Modified Rankin (Stroke Patients Only)       Balance Overall balance assessment: Needs assistance Sitting-balance support: No upper extremity supported;Feet supported Sitting balance-Leahy Scale: Fair     Standing balance support: Single extremity supported;During functional activity Standing balance-Leahy Scale: Fair Standing balance comment: +2 assistance for safety due to dizziness                            Cognition Arousal/Alertness: Awake/alert Behavior During Therapy: WFL for tasks assessed/performed Overall Cognitive Status: Within Functional Limits for tasks assessed                                        Exercises      General Comments        Pertinent  Vitals/Pain Faces Pain Scale: Hurts little more Pain Location: right lower quadrant Pain Descriptors / Indicators: Grimacing;Guarding Pain Intervention(s): Monitored during session    Home Living                      Prior Function            PT Goals (current goals can now be found in the care plan section) Acute Rehab PT Goals Patient Stated Goal: to be able to walk into her house Progress towards PT goals: Progressing toward goals    Frequency    Min 3X/week      PT Plan Current plan remains appropriate     Co-evaluation PT/OT/SLP Co-Evaluation/Treatment: Yes Reason for Co-Treatment: For patient/therapist safety PT goals addressed during session: Mobility/safety with mobility        AM-PAC PT "6 Clicks" Mobility   Outcome Measure  Help needed turning from your back to your side while in a flat bed without using bedrails?: A Little Help needed moving from lying on your back to sitting on the side of a flat bed without using bedrails?: A Little Help needed moving to and from a bed to a chair (including a wheelchair)?: A Little Help needed standing up from a chair using your arms (e.g., wheelchair or bedside chair)?: A Little Help needed to walk in hospital room?: A Lot Help needed climbing 3-5 steps with a railing? : A Lot 6 Click Score: 16    End of Session   Activity Tolerance: Treatment limited secondary to medical complications (Comment) (orthostatic/passed out) Patient left: in bed;with call bell/phone within reach Nurse Communication: Mobility status PT Visit Diagnosis: Muscle weakness (generalized) (M62.81);Difficulty in walking, not elsewhere classified (R26.2)     Time: 1030-1110 PT Time Calculation (min) (ACUTE ONLY): 40 min  Charges:  $Therapeutic Activity: 8-22 mins                     Blanchard Kelch PT Acute Rehabilitation Services Pager [phone] Office [phone]    Rada Hay 08/07/2020, 1:22 PM

## 2020-08-07 NOTE — Progress Notes (Signed)
PROGRESS NOTE  Penny Huerta LYY:[id number] DOB: 1965-10-14 DOA: 07/26/2020 PCP: Patient, No Pcp Per   LOS: 12 days   Brief Narrative / Interim history: 54 year old female with obesity, PAF, HLD, anxiety, COVID-19 infection who came into the hospital and was admitted 11/12 with hypoxia and shortness of breath initially requiring a nonrebreather.  Imaging showed multifocal pneumonia as well as cardiomegaly and vascular congestion consistent with pulmonary edema.  During her hospital stay she developed right upper quadrant abdominal pain  Subjective / 24h Interval events: She is doing well this morning, denies any shortness of breath.  Overall feeling very weak  Assessment & Plan:  Principal Problem Acute Hypoxic Respiratory Failure due to Covid-19 Viral Illness as well as pulmonary edema due to acute on chronic diastolic CHF -This is resolved, she received several days of Lasix, completed Remdesivir as well as 10 days of steroids.  She is on room air  Active Problems Acute pulmonary edema due to acute on chronic diastolic CHF -Underwent a 2D echo which showed grade 1 diastolic dysfunction, LVEF is preserved.  Currently appears on the dry side, and due to orthostatic hypotension and weakness will actually give back some fluids.  Right upper quadrant pain, elevated bilirubin -Right abdominal ultrasound showed a 3.2 cm cholelithiasis without any evidence of cholecystitis, no gallbladder wall thickening, no pericholecystic fluid.  An MRI/MRCP showed a large intramuscular hematoma which is 13 x 12 x 7 cm with edema in the body wall around this hematoma, lesion cannot be excluded due to lack of contrast.  Follow-up imaging will be helpful, radiology recommends an MRI in 4 to 6 weeks -No history of trauma, this appears to be spontaneous.  Hold Lovenox for DVT prophylaxis  Orthostatic hypotension -Possibly dry, will give fluids and recheck in the morning.  She was barely able to stand and almost  passed out when working with physical therapy  PAF -Currently in sinus  Morbid obesity -She would benefit from weight loss, BMI 48  Hyperlipidemia -Allergic to statins, outpatient follow-up  DM2, steroid-induced hyperglycemia -A1c 7.6, now she is off steroids, monitor CBGs  CBG (last 3)  Recent Labs    08/06/20 2204 08/07/20 0908 08/07/20 1345  GLUCAP 207* 153* 180*    Scheduled Meds: . insulin aspart  0-20 Units Subcutaneous TID WC  . insulin aspart  0-5 Units Subcutaneous QHS  . insulin aspart  10 Units Subcutaneous TID WC  . insulin detemir  50 Units Subcutaneous Daily  . linagliptin  5 mg Oral Daily  . metFORMIN  500 mg Oral BID WC  . ondansetron  4 mg Oral TID  . polyethylene glycol  17 g Oral Daily  . Ensure Max Protein  11 oz Oral BID  . senna-docusate  1 tablet Oral BID  . sodium chloride flush  3 mL Intravenous Q12H   Continuous Infusions: . sodium chloride     PRN Meds:.sodium chloride, acetaminophen, albuterol, guaiFENesin-dextromethorphan, ondansetron **OR** ondansetron (ZOFRAN) IV, sodium chloride, sodium chloride flush  DVT prophylaxis: SCDs Code Status: Full code Family Communication: husband Ardeth Repetto [phone]  Status is: Inpatient  Remains inpatient appropriate because:Inpatient level of care appropriate due to severity of illness   Dispo: The patient is from: Home              Anticipated d/c is to: Home              Anticipated d/c date is: 2 days  Patient currently is not medically stable to d/c.  Consultants:  None   Procedures:  None   Microbiology: None   Antibacterials: None    Objective: Vitals:   08/06/20 0600 08/06/20 2127 08/07/20 0522 08/07/20 1058  BP:  (!) 111/92 (!) 104/42 112/72  Pulse:  96 98 94  Resp: 17 19 20 20   Temp:  98.4 F (36.9 C) 99.1 F (37.3 C) 97.9 F (36.6 C)  TempSrc:  Oral Oral Oral  SpO2:  95% 94% 97%  Weight:      Height:        Intake/Output Summary (Last 24 hours)  at 08/07/2020 1416 Last data filed at 08/07/2020 0524 Gross per 24 hour  Intake 240 ml  Output 600 ml  Net -360 ml   Filed Weights   08/04/20 0500 08/05/20 0600 08/06/20 0500  Weight: 132.6 kg (!) 136.4 kg (!) 136.5 kg    Examination:  Constitutional: NAD Eyes: no scleral icterus ENMT: Mucous membranes are moist.  Neck: normal, supple Respiratory: clear to auscultation bilaterally, no wheezing, no crackles. Normal respiratory effort Cardiovascular: Regular rate and rhythm, no murmurs / rubs / gallops. No LE edema. Good peripheral pulses Abdomen: non distended, mild tenderness right upper quadrant, bowel sounds positive Musculoskeletal: no clubbing / cyanosis.  Skin: no rashes Neurologic: No focal deficits   Data Reviewed: I have independently reviewed following labs and imaging studies   CBC: Recent Labs  Lab 08/05/20 0442 08/06/20 0429  WBC 20.1* 24.0*  NEUTROABS  --  18.4*  HGB 14.3 12.7  HCT 43.2 37.6  MCV 86.7 85.1  PLT 363 339   Basic Metabolic Panel: Recent Labs  Lab 08/01/20 0912 08/02/20 0533 08/03/20 0528 08/05/20 0442 08/06/20 0429  NA 132* 129* 133* 131* 128*  K 3.4* 3.2* 3.4* 3.7 4.5  CL 89* 88* 90* 92* 90*  CO2 27 29 29 27 25   GLUCOSE 238* 215* 228* 137* 214*  BUN 42* 40* 47* 46* 44*  CREATININE 0.72 0.63 0.78 0.72 0.77  CALCIUM 8.7* 8.7* 8.7* 8.7* 9.1   GFR: Estimated Creatinine Clearance: 115.8 mL/min (by C-G formula based on SCr of 0.77 mg/dL). Liver Function Tests: Recent Labs  Lab 08/05/20 0442 08/06/20 0429  AST 64* 37  ALT 98* 75*  ALKPHOS 47 49  BILITOT 1.4*  1.6* 1.6*  PROT 7.0 6.9  ALBUMIN 3.5 3.4*   No results for input(s): LIPASE, AMYLASE in the last 168 hours. No results for input(s): AMMONIA in the last 168 hours. Coagulation Profile: No results for input(s): INR, PROTIME in the last 168 hours. Cardiac Enzymes: No results for input(s): CKTOTAL, CKMB, CKMBINDEX, TROPONINI in the last 168 hours. BNP (last 3  results) No results for input(s): PROBNP in the last 8760 hours. HbA1C: No results for input(s): HGBA1C in the last 72 hours. CBG: Recent Labs  Lab 08/06/20 1149 08/06/20 1739 08/06/20 2204 08/07/20 0908 08/07/20 1345  GLUCAP 217* 185* 207* 153* 180*   Lipid Profile: No results for input(s): CHOL, HDL, LDLCALC, TRIG, CHOLHDL, LDLDIRECT in the last 72 hours. Thyroid Function Tests: No results for input(s): TSH, T4TOTAL, FREET4, T3FREE, THYROIDAB in the last 72 hours. Anemia Panel: No results for input(s): VITAMINB12, FOLATE, FERRITIN, TIBC, IRON, RETICCTPCT in the last 72 hours. Urine analysis:    Component Value Date/Time   BILIRUBINUR Neg 03/10/2013 1510   PROTEINUR Neg 03/10/2013 1510   UROBILINOGEN 0.2 03/10/2013 1510   NITRITE Neg 03/10/2013 1510   LEUKOCYTESUR Negative 03/10/2013 1510   Sepsis  Labs: Invalid input(s): PROCALCITONIN, LACTICIDVEN  No results found for this or any previous visit (from the past 240 hour(s)).    Radiology Studies: MR ABDOMEN WO CONTRAST  Result Date: 08/06/2020 CLINICAL DATA:  RIGHT upper quadrant pain for 1 day EXAM: MRI ABDOMEN WITHOUT CONTRAST TECHNIQUE: Multiplanar multisequence MR imaging was performed without the administration of intravenous contrast. Exam was terminated at the request of the patient. Noncontrast images were obtained of the abdomen. No biliary evaluation or contrasted imaging was performed. COMPARISON:  Ultrasound of the abdomen dated August 05, 2020 FINDINGS: Lower chest: Incidental imaging of the lung bases with signs of basilar airspace disease incompletely evaluated, perhaps atelectasis. Hepatobiliary: Hepatic steatosis, at least moderate. Assessment limited by lack of contrast and respiratory motion. Cholelithiasis. No biliary duct dilation. The large gallstone in the gallbladder lumen. No pericholecystic stranding. Pancreas: Mild heterogeneity in the neck of the pancreas (image 27 of series 3) area measuring  approximately 1.8 cm on T2 weighted imaging. No gross peripancreatic fluid. Question mild heterogeneity elsewhere in the pancreas. No ductal dilation. Spleen: Normal in size and contour. No focal splenic lesion. Low signal on T2 raising the question of iron deposition in the spleen. Note that in and out of phase imaging is limited by marked artifact in the area of the spleen not allowing for definitive assessment. Adrenals/Urinary Tract: Adrenal glands are normal. Kidneys are normal in size and contour. No gross renal lesion. Stomach/Bowel: Limited assessment of the gastrointestinal tract without is acute process. Bowel is incompletely imaged. Vascular/Lymphatic: Vascular structures of normal caliber. There is no gastrohepatic or hepatoduodenal ligament lymphadenopathy. No retroperitoneal or mesenteric lymphadenopathy. Limited assessment of vascular structures due to lack of intravenous contrast. Other: Large intramuscular hematoma approximately 13 x 12 x 7 cm with edema in the body wall around this hematoma. Mixed signal on T2 weighted imaging with some areas of mild increased T1 signal. Musculoskeletal: RIGHT rectus hematoma. No focal, suspicious bone abnormality. IMPRESSION: 1. Large intramuscular hematoma approximately 13 x 12 x 7 cm with edema in the body wall around this hematoma. Given lack of contrast a lesion in this location is not excluded on the basis of the current study. Follow-up may be helpful to track for resolution exclude the remote possibility that there is an underlying lesion. 2. Mild heterogeneity in the neck of the pancreas measuring approximately 1.8 cm area on T2 weighted imaging. This is not well evaluated given some artifact on the current study and respiratory motion on T1 weighted imaging. Would suggest follow-up MRI in 4-6 weeks for dedicated pancreatic assessment to exclude underlying lesion. Would also correlate with lipase in the meantime to exclude the possibility of mild  pancreatitis. 3. Above findings could be followed with either CT or MRI, MRI would be preferred. 4. Low signal on T2 raising the question of iron deposition in the spleen. Correlate with any history of exogenous iron administration. 5. Hepatic steatosis, at least moderate. 6. Cholelithiasis. No biliary duct dilation or pericholecystic stranding. 7. Bibasilar airspace disease, incompletely evaluated, perhaps atelectasis. These results were called by telephone at the time of interpretation on 08/06/2020 at 8:49 pm to provider Webb Silversmith, NP, who verbally acknowledged these results. Electronically Signed   By: Donzetta Kohut M.D.   On: 08/06/2020 20:49    Pamella Pert, MD, PhD [facility]  Between 7 am - 7 pm I am available, please contact me via Amion or Securechat  Between 7 pm - 7 am I am not available, please contact night  coverage MD/APP via Amion

## 2020-08-07 NOTE — Plan of Care (Signed)
  Problem: Education: Goal: Knowledge of risk factors and measures for prevention of condition will improve Outcome: Progressing   Problem: Coping: Goal: Psychosocial and spiritual needs will be supported Outcome: Progressing   Problem: Respiratory: Goal: Will maintain a patent airway Outcome: Progressing Goal: Complications related to the disease process, condition or treatment will be avoided or minimized Outcome: Progressing   Problem: Education: Goal: Ability to describe self-care measures that may prevent or decrease complications (Diabetes Survival Skills Education) will improve Outcome: Progressing   Problem: Coping: Goal: Ability to adjust to condition or change in health will improve Outcome: Progressing   Problem: Fluid Volume: Goal: Ability to maintain a balanced intake and output will improve Outcome: Progressing   Problem: Nutritional: Goal: Maintenance of adequate nutrition will improve Outcome: Progressing Goal: Progress toward achieving an optimal weight will improve Outcome: Progressing   

## 2020-08-07 NOTE — Progress Notes (Signed)
Occupational Therapy Treatment Patient Details Name: Penny Huerta MRN: [id number] DOB: 03-15-1966 Today's Date: 08/07/2020    History of present illness Patient is a 54 year old female admitted 11/12 PMH of obesity, atrial fibrillation, hyperlipidemia, anxiety who had progressively worsening SOB ~1 week. Patient diagnosed with COVID 11/9. Patient began complaining of abdominal pain and found to have large intramuscular hematoma.   OT comments  Patient limited by orthostatic hypotension, abdominal pain, and fatigue today resulting in a decline in functional abilities. Equipment recommended updated to 3-1 BSC. Patient dizzy with sitting and standing (+2 assistance for safety). Unable to stand long enough for full BP before sitting. Began to lose consciousness at edge of bed and staring off into space - max assist to return to bed to recover. BP 87/48 and recovered to 112/72. Performed transfer to sitting again with BP 91/32. Able to briefly stand (partial) to scoot towards head of bead. Encouraged frequent bed mobility and upright sitting to improve activity tolerance. Will reassess patient next treatment to determine if she has the activity tolerance to go home. She may need short term rehab.    Follow Up Recommendations  Home health OT;Supervision/Assistance - 24 hour    Equipment Recommendations  3 in 1 bedside commode    Recommendations for Other Services      Precautions / Restrictions Precautions Precautions: Fall Precaution Comments: Orthostatic Restrictions Weight Bearing Restrictions: No       Mobility Bed Mobility Overal bed mobility: Needs Assistance Bed Mobility: Rolling;Supine to Sit;Sit to Supine Rolling: Supervision   Supine to sit: Supervision Sit to supine: Supervision   General bed mobility comments: Use of bed rails and increased time for bed mobility. With BP eopisode required +2 to return to supine safely.  Transfers Overall transfer level: Needs  assistance Equipment used: Rolling walker (2 wheeled) Transfers: Sit to/from Stand Sit to Stand: Min assist;+2 safety/equipment         General transfer comment: Patient reports dizziness prior to movement. Increased with sitting. BP 98/64 in sitting. Performed standing with +2 assistance for safety. Patient unable to maintain prolonged standing stating "I have to sit down" (approx 30 seconds). Returned to seated position before BP completed - in seated position exhibited staring off into space and began to pass out losing trunk control. Patient total to return to supine due to decreased consciousness. Recoverd quickly back in supine. Incomplete standing BP 87/48. Return to supine BP 112/72.    Balance Overall balance assessment: Needs assistance Sitting-balance support: No upper extremity supported;Feet supported Sitting balance-Leahy Scale: Fair     Standing balance support: Single extremity supported;During functional activity Standing balance-Leahy Scale: Fair Standing balance comment: +2 assistance for safety due to dizziness                           ADL either performed or assessed with clinical judgement   ADL                       Lower Body Dressing: Total assistance;Bed level Lower Body Dressing Details (indicate cue type and reason): Unable to don socks today due to abdominal pain.   Toilet Transfer Details (indicate cue type and reason): unable Toileting- Clothing Manipulation and Hygiene: Bed level;Maximal assistance Toileting - Clothing Manipulation Details (indicate cue type and reason): Max assist for toileting at bed level today due to inability to get to [place]. Patient able to assist with rolling but did  not perform pericare.             Vision   Vision Assessment?: No apparent visual deficits   Perception     Praxis      Cognition Arousal/Alertness: Awake/alert Behavior During Therapy: WFL for tasks assessed/performed Overall  Cognitive Status: Within Functional Limits for tasks assessed                                          Exercises     Shoulder Instructions       General Comments      Pertinent Vitals/ Pain       Faces Pain Scale: Hurts little more Pain Location: right lower quadrant Pain Descriptors / Indicators: Grimacing;Guarding Pain Intervention(s): Monitored during session  Home Living                                          Prior Functioning/Environment              Frequency  Min 2X/week        Progress Toward Goals  OT Goals(current goals can now be found in the care plan section)  Progress towards OT goals: OT to reassess next treatment  Acute Rehab OT Goals Patient Stated Goal: to be able to walk into her house OT Goal Formulation: With patient Time For Goal Achievement: 08/15/20 Potential to Achieve Goals: Good  Plan Discharge plan remains appropriate;Equipment recommendations need to be updated    Co-evaluation    PT/OT/SLP Co-Evaluation/Treatment: Yes Reason for Co-Treatment: For patient/therapist safety          AM-PAC OT "6 Clicks" Daily Activity     Outcome Measure   Help from another person eating meals?: None Help from another person taking care of personal grooming?: A Little Help from another person toileting, which includes using toliet, bedpan, or urinal?: Total Help from another person bathing (including washing, rinsing, drying)?: A Lot Help from another person to put on and taking off regular upper body clothing?: A Little Help from another person to put on and taking off regular lower body clothing?: Total 6 Click Score: 14    End of Session Equipment Utilized During Treatment: Rolling walker  OT Visit Diagnosis: Other abnormalities of gait and mobility (R26.89)   Activity Tolerance Patient limited by fatigue;Other (comment) (dizziness, orthostatic hypotension)   Patient Left in chair;with call  bell/phone within reach   Nurse Communication  (RN in room after orthostatic episode)        Time: 4174-0814 OT Time Calculation (min): 35 min  Charges: OT General Charges $OT Visit: 1 Visit OT Treatments $Therapeutic Activity: 8-22 mins  Waldron Session, OTR/L Acute Care Rehab Services  Office [phone] Pager: [phone]    Kelli Churn 08/07/2020, 12:43 PM

## 2020-08-08 DIAGNOSIS — R739 Hyperglycemia, unspecified: Secondary | ICD-10-CM | POA: Diagnosis not present

## 2020-08-08 DIAGNOSIS — U071 COVID-19: Secondary | ICD-10-CM | POA: Diagnosis not present

## 2020-08-08 DIAGNOSIS — E785 Hyperlipidemia, unspecified: Secondary | ICD-10-CM | POA: Diagnosis not present

## 2020-08-08 DIAGNOSIS — J9601 Acute respiratory failure with hypoxia: Secondary | ICD-10-CM | POA: Diagnosis not present

## 2020-08-08 LAB — BASIC METABOLIC PANEL
Anion gap: 11 (ref 5–15)
BUN: 34 mg/dL — ABNORMAL HIGH (ref 6–20)
CO2: 25 mmol/L (ref 22–32)
Calcium: 8.8 mg/dL — ABNORMAL LOW (ref 8.9–10.3)
Chloride: 88 mmol/L — ABNORMAL LOW (ref 98–111)
Creatinine, Ser: 0.9 mg/dL (ref 0.44–1.00)
GFR, Estimated: >60 mL/min (ref >60)
Glucose, Bld: 140 mg/dL — ABNORMAL HIGH (ref 70–99)
Potassium: 5.3 mmol/L — ABNORMAL HIGH (ref 3.5–5.1)
Sodium: 124 mmol/L — ABNORMAL LOW (ref 135–145)

## 2020-08-08 LAB — CBC
HCT: 25.7 % — ABNORMAL LOW (ref 36.0–46.0)
Hemoglobin: 8.8 g/dL — ABNORMAL LOW (ref 12.0–15.0)
MCH: 29.3 pg (ref 26.0–34.0)
MCHC: 34.2 g/dL (ref 30.0–36.0)
MCV: 85.7 fL (ref 80.0–100.0)
Platelets: 271 10*3/uL (ref 150–400)
RBC: 3 MIL/uL — ABNORMAL LOW (ref 3.87–5.11)
RDW: 13.2 % (ref 11.5–15.5)
WBC: 23.9 10*3/uL — ABNORMAL HIGH (ref 4.0–10.5)
nRBC: 0 % (ref 0.0–0.2)

## 2020-08-08 LAB — COMPREHENSIVE METABOLIC PANEL
ALT: 40 U/L (ref 0–44)
AST: 22 U/L (ref 15–41)
Albumin: 2.9 g/dL — ABNORMAL LOW (ref 3.5–5.0)
Alkaline Phosphatase: 53 U/L (ref 38–126)
Anion gap: 10 (ref 5–15)
BUN: 42 mg/dL — ABNORMAL HIGH (ref 6–20)
CO2: 26 mmol/L (ref 22–32)
Calcium: 9.2 mg/dL (ref 8.9–10.3)
Chloride: 87 mmol/L — ABNORMAL LOW (ref 98–111)
Creatinine, Ser: 0.92 mg/dL (ref 0.44–1.00)
GFR, Estimated: >60 mL/min (ref >60)
Glucose, Bld: 163 mg/dL — ABNORMAL HIGH (ref 70–99)
Potassium: 5.3 mmol/L — ABNORMAL HIGH (ref 3.5–5.1)
Sodium: 123 mmol/L — ABNORMAL LOW (ref 135–145)
Total Bilirubin: 1.1 mg/dL (ref 0.3–1.2)
Total Protein: 6.4 g/dL — ABNORMAL LOW (ref 6.5–8.1)

## 2020-08-08 LAB — GLUCOSE, CAPILLARY
Glucose-Capillary: 151 mg/dL — ABNORMAL HIGH (ref 70–99)
Glucose-Capillary: 135 mg/dL — ABNORMAL HIGH (ref 70–99)
Glucose-Capillary: 122 mg/dL — ABNORMAL HIGH (ref 70–99)
Glucose-Capillary: 93 mg/dL (ref 70–99)

## 2020-08-08 MED ORDER — SODIUM CHLORIDE 0.9 % IV SOLN: INTRAVENOUS | Status: DC

## 2020-08-08 NOTE — Progress Notes (Signed)
PROGRESS NOTE  Penny Huerta XNA:[id number] DOB: [birth year] DOA: 07/26/2020 PCP: Patient, No Pcp Per   LOS: 13 days   Brief Narrative / Interim history: 54 year old female with obesity, PAF, HLD, anxiety, COVID-19 infection who came into the hospital and was admitted 11/12 with hypoxia and shortness of breath initially requiring a nonrebreather.  Imaging showed multifocal pneumonia as well as cardiomegaly and vascular congestion consistent with pulmonary edema.  During her hospital stay she developed right upper quadrant abdominal pain  Subjective / 24h Interval events: Has not been walking much.  Last night she tried to get up with PT yesterday when she almost passed out  Assessment & Plan:  Principal Problem Acute Hypoxic Respiratory Failure due to Covid-19 Viral Illness as well as pulmonary edema due to acute on chronic diastolic CHF -This is resolved, she received several days of Lasix, completed Remdesivir as well as 10 days of steroids.  She is on room air  Active Problems Acute pulmonary edema due to acute on chronic diastolic CHF -Underwent a 2D echo which showed grade 1 diastolic dysfunction, LVEF is preserved.  Appears dehydrated currently, with hypochloremic hyponatremia.  Gentle hydration  Right upper quadrant pain, elevated bilirubin -Right abdominal ultrasound showed a 3.2 cm cholelithiasis without any evidence of cholecystitis, no gallbladder wall thickening, no pericholecystic fluid.  An MRI/MRCP showed a large intramuscular hematoma which is 13 x 12 x 7 cm with edema in the body wall around this hematoma, lesion cannot be excluded due to lack of contrast.  Follow-up imaging will be helpful, radiology recommends an MRI in 4 to 6 weeks -No history of trauma, this appears to be spontaneous.  Hold Lovenox for DVT prophylaxis  Orthostatic hypotension -Gentle hydration again today  Hyponatremia -With hypokalemia, quite elevated BUN, suspect dehydrated, gentle fluids and  repeat sodium this afternoon  PAF -Currently in sinus  Morbid obesity -She would benefit from weight loss, BMI 48  Hyperlipidemia -Allergic to statins, outpatient follow-up  DM2, steroid-induced hyperglycemia -A1c 7.6, now she is off steroids, monitor CBGs  CBG (last 3)  Recent Labs    08/07/20 2009 08/08/20 0741 08/08/20 1115  GLUCAP 126* 151* 135*    Scheduled Meds: . insulin aspart  0-20 Units Subcutaneous TID WC  . insulin aspart  0-5 Units Subcutaneous QHS  . insulin aspart  10 Units Subcutaneous TID WC  . insulin detemir  50 Units Subcutaneous Daily  . linagliptin  5 mg Oral Daily  . metFORMIN  500 mg Oral BID WC  . ondansetron  4 mg Oral TID  . polyethylene glycol  17 g Oral Daily  . Ensure Max Protein  11 oz Oral BID  . senna-docusate  1 tablet Oral BID  . sodium chloride flush  3 mL Intravenous Q12H   Continuous Infusions: . sodium chloride    . sodium chloride 75 mL/hr at 08/08/20 1055   PRN Meds:.sodium chloride, acetaminophen, albuterol, guaiFENesin-dextromethorphan, ondansetron **OR** ondansetron (ZOFRAN) IV, sodium chloride, sodium chloride flush  DVT prophylaxis: SCDs Code Status: Full code Family Communication: husband Penny Huerta [phone]  Status is: Inpatient  Remains inpatient appropriate because:Inpatient level of care appropriate due to severity of illness   Dispo: The patient is from: Home              Anticipated d/c is to: Home              Anticipated d/c date is: 2 days  Patient currently is not medically stable to d/c.  Consultants:  None   Procedures:  None   Microbiology: None   Antibacterials: None    Objective: Vitals:   08/07/20 1058 08/07/20 2013 08/08/20 0611 08/08/20 1119  BP: 112/72 101/60 121/66 115/74  Pulse: 94 98 94 89  Resp: 20 18 20    Temp: 97.9 F (36.6 C) 98.2 F (36.8 C) 98.1 F (36.7 C) 98.3 F (36.8 C)  TempSrc: Oral Oral Oral Oral  SpO2: 97% 95% 94% 97%  Weight:        Height:        Intake/Output Summary (Last 24 hours) at 08/08/2020 1126 Last data filed at 08/07/2020 2320 Gross per 24 hour  Intake 0 ml  Output 900 ml  Net -900 ml   Filed Weights   08/04/20 0500 08/05/20 0600 08/06/20 0500  Weight: 132.6 kg (!) 136.4 kg (!) 136.5 kg    Examination:  Constitutional: No distress Eyes: No icterus ENMT: Dry mucous membranes Neck: normal, supple Respiratory: Clear bilaterally without wheezing or crackles, normal respiratory effort Cardiovascular: Regular rate and rhythm, no murmurs, no peripheral edema Abdomen: Nondistended, mild tenderness right upper quadrant Musculoskeletal: no clubbing / cyanosis.  Skin: no rashes Neurologic: No focal deficits   Data Reviewed: I have independently reviewed following labs and imaging studies   CBC: Recent Labs  Lab 08/05/20 0442 08/06/20 0429 08/08/20 0414  WBC 20.1* 24.0* 23.9*  NEUTROABS  --  18.4*  --   HGB 14.3 12.7 8.8*  HCT 43.2 37.6 25.7*  MCV 86.7 85.1 85.7  PLT 363 339 271   Basic Metabolic Panel: Recent Labs  Lab 08/02/20 0533 08/03/20 0528 08/05/20 0442 08/06/20 0429 08/08/20 0414  NA 129* 133* 131* 128* 123*  K 3.2* 3.4* 3.7 4.5 5.3*  CL 88* 90* 92* 90* 87*  CO2 29 29 27 25 26   GLUCOSE 215* 228* 137* 214* 163*  BUN 40* 47* 46* 44* 42*  CREATININE 0.63 0.78 0.72 0.77 0.92  CALCIUM 8.7* 8.7* 8.7* 9.1 9.2   GFR: Estimated Creatinine Clearance: 100.7 mL/min (by C-G formula based on SCr of 0.92 mg/dL). Liver Function Tests: Recent Labs  Lab 08/05/20 0442 08/06/20 0429 08/08/20 0414  AST 64* 37 22  ALT 98* 75* 40  ALKPHOS 47 49 53  BILITOT 1.4*  1.6* 1.6* 1.1  PROT 7.0 6.9 6.4*  ALBUMIN 3.5 3.4* 2.9*   No results for input(s): LIPASE, AMYLASE in the last 168 hours. No results for input(s): AMMONIA in the last 168 hours. Coagulation Profile: No results for input(s): INR, PROTIME in the last 168 hours. Cardiac Enzymes: No results for input(s): CKTOTAL, CKMB,  CKMBINDEX, TROPONINI in the last 168 hours. BNP (last 3 results) No results for input(s): PROBNP in the last 8760 hours. HbA1C: No results for input(s): HGBA1C in the last 72 hours. CBG: Recent Labs  Lab 08/07/20 1345 08/07/20 1702 08/07/20 2009 08/08/20 0741 08/08/20 1115  GLUCAP 180* 132* 126* 151* 135*   Lipid Profile: No results for input(s): CHOL, HDL, LDLCALC, TRIG, CHOLHDL, LDLDIRECT in the last 72 hours. Thyroid Function Tests: No results for input(s): TSH, T4TOTAL, FREET4, T3FREE, THYROIDAB in the last 72 hours. Anemia Panel: No results for input(s): VITAMINB12, FOLATE, FERRITIN, TIBC, IRON, RETICCTPCT in the last 72 hours. Urine analysis:    Component Value Date/Time   BILIRUBINUR Neg 03/10/2013 1510   PROTEINUR Neg 03/10/2013 1510   UROBILINOGEN 0.2 03/10/2013 1510   NITRITE Neg 03/10/2013 1510   LEUKOCYTESUR Negative  03/10/2013 1510   Sepsis Labs: Invalid input(s): PROCALCITONIN, LACTICIDVEN  No results found for this or any previous visit (from the past 240 hour(s)).    Radiology Studies: MR ABDOMEN WO CONTRAST  Result Date: 08/06/2020 CLINICAL DATA:  RIGHT upper quadrant pain for 1 day EXAM: MRI ABDOMEN WITHOUT CONTRAST TECHNIQUE: Multiplanar multisequence MR imaging was performed without the administration of intravenous contrast. Exam was terminated at the request of the patient. Noncontrast images were obtained of the abdomen. No biliary evaluation or contrasted imaging was performed. COMPARISON:  Ultrasound of the abdomen dated August 05, 2020 FINDINGS: Lower chest: Incidental imaging of the lung bases with signs of basilar airspace disease incompletely evaluated, perhaps atelectasis. Hepatobiliary: Hepatic steatosis, at least moderate. Assessment limited by lack of contrast and respiratory motion. Cholelithiasis. No biliary duct dilation. The large gallstone in the gallbladder lumen. No pericholecystic stranding. Pancreas: Mild heterogeneity in the neck of  the pancreas (image 27 of series 3) area measuring approximately 1.8 cm on T2 weighted imaging. No gross peripancreatic fluid. Question mild heterogeneity elsewhere in the pancreas. No ductal dilation. Spleen: Normal in size and contour. No focal splenic lesion. Low signal on T2 raising the question of iron deposition in the spleen. Note that in and out of phase imaging is limited by marked artifact in the area of the spleen not allowing for definitive assessment. Adrenals/Urinary Tract: Adrenal glands are normal. Kidneys are normal in size and contour. No gross renal lesion. Stomach/Bowel: Limited assessment of the gastrointestinal tract without is acute process. Bowel is incompletely imaged. Vascular/Lymphatic: Vascular structures of normal caliber. There is no gastrohepatic or hepatoduodenal ligament lymphadenopathy. No retroperitoneal or mesenteric lymphadenopathy. Limited assessment of vascular structures due to lack of intravenous contrast. Other: Large intramuscular hematoma approximately 13 x 12 x 7 cm with edema in the body wall around this hematoma. Mixed signal on T2 weighted imaging with some areas of mild increased T1 signal. Musculoskeletal: RIGHT rectus hematoma. No focal, suspicious bone abnormality. IMPRESSION: 1. Large intramuscular hematoma approximately 13 x 12 x 7 cm with edema in the body wall around this hematoma. Given lack of contrast a lesion in this location is not excluded on the basis of the current study. Follow-up may be helpful to track for resolution exclude the remote possibility that there is an underlying lesion. 2. Mild heterogeneity in the neck of the pancreas measuring approximately 1.8 cm area on T2 weighted imaging. This is not well evaluated given some artifact on the current study and respiratory motion on T1 weighted imaging. Would suggest follow-up MRI in 4-6 weeks for dedicated pancreatic assessment to exclude underlying lesion. Would also correlate with lipase in the  meantime to exclude the possibility of mild pancreatitis. 3. Above findings could be followed with either CT or MRI, MRI would be preferred. 4. Low signal on T2 raising the question of iron deposition in the spleen. Correlate with any history of exogenous iron administration. 5. Hepatic steatosis, at least moderate. 6. Cholelithiasis. No biliary duct dilation or pericholecystic stranding. 7. Bibasilar airspace disease, incompletely evaluated, perhaps atelectasis. These results were called by telephone at the time of interpretation on 08/06/2020 at 8:49 pm to provider Webb Silversmith, NP, who verbally acknowledged these results. Electronically Signed   By: Donzetta Kohut M.D.   On: 08/06/2020 20:49    Pamella Pert, MD, PhD [facility]  Between 7 am - 7 pm I am available, please contact me via Amion or Securechat  Between 7 pm - 7 am I am  not available, please contact night coverage MD/APP via Amion

## 2020-08-08 NOTE — Plan of Care (Signed)
  Problem: Education: Goal: Knowledge of risk factors and measures for prevention of condition will improve Outcome: Progressing   Problem: Coping: Goal: Psychosocial and spiritual needs will be supported Outcome: Progressing   Problem: Respiratory: Goal: Will maintain a patent airway Outcome: Progressing Goal: Complications related to the disease process, condition or treatment will be avoided or minimized Outcome: Progressing   Problem: Education: Goal: Ability to describe self-care measures that may prevent or decrease complications (Diabetes Survival Skills Education) will improve Outcome: Progressing   Problem: Coping: Goal: Ability to adjust to condition or change in health will improve Outcome: Progressing   Problem: Fluid Volume: Goal: Ability to maintain a balanced intake and output will improve Outcome: Progressing   Problem: Nutritional: Goal: Maintenance of adequate nutrition will improve Outcome: Progressing Goal: Progress toward achieving an optimal weight will improve Outcome: Progressing   

## 2020-08-09 DIAGNOSIS — E785 Hyperlipidemia, unspecified: Secondary | ICD-10-CM | POA: Diagnosis not present

## 2020-08-09 DIAGNOSIS — R739 Hyperglycemia, unspecified: Secondary | ICD-10-CM | POA: Diagnosis not present

## 2020-08-09 DIAGNOSIS — J9601 Acute respiratory failure with hypoxia: Secondary | ICD-10-CM | POA: Diagnosis not present

## 2020-08-09 DIAGNOSIS — U071 COVID-19: Secondary | ICD-10-CM | POA: Diagnosis not present

## 2020-08-09 LAB — BASIC METABOLIC PANEL
Anion gap: 10 (ref 5–15)
BUN: 31 mg/dL — ABNORMAL HIGH (ref 6–20)
CO2: 25 mmol/L (ref 22–32)
Calcium: 8.4 mg/dL — ABNORMAL LOW (ref 8.9–10.3)
Chloride: 91 mmol/L — ABNORMAL LOW (ref 98–111)
Creatinine, Ser: 0.86 mg/dL (ref 0.44–1.00)
GFR, Estimated: >60 mL/min (ref >60)
Glucose, Bld: 130 mg/dL — ABNORMAL HIGH (ref 70–99)
Potassium: 4.8 mmol/L (ref 3.5–5.1)
Sodium: 126 mmol/L — ABNORMAL LOW (ref 135–145)

## 2020-08-09 LAB — PREPARE RBC (CROSSMATCH)

## 2020-08-09 LAB — CBC
HCT: 22.1 % — ABNORMAL LOW (ref 36.0–46.0)
Hemoglobin: 7.3 g/dL — ABNORMAL LOW (ref 12.0–15.0)
MCH: 29.9 pg (ref 26.0–34.0)
MCHC: 33 g/dL (ref 30.0–36.0)
MCV: 90.6 fL (ref 80.0–100.0)
Platelets: 318 10*3/uL (ref 150–400)
RBC: 2.44 MIL/uL — ABNORMAL LOW (ref 3.87–5.11)
RDW: 13.4 % (ref 11.5–15.5)
WBC: 18.2 10*3/uL — ABNORMAL HIGH (ref 4.0–10.5)
nRBC: 0.2 % (ref 0.0–0.2)

## 2020-08-09 LAB — SODIUM: Sodium: 130 mmol/L — ABNORMAL LOW (ref 135–145)

## 2020-08-09 LAB — GLUCOSE, CAPILLARY
Glucose-Capillary: 124 mg/dL — ABNORMAL HIGH (ref 70–99)
Glucose-Capillary: 105 mg/dL — ABNORMAL HIGH (ref 70–99)
Glucose-Capillary: 86 mg/dL (ref 70–99)
Glucose-Capillary: 107 mg/dL — ABNORMAL HIGH (ref 70–99)

## 2020-08-09 MED ORDER — SODIUM CHLORIDE 0.9% IV SOLUTION: Freq: Once | INTRAVENOUS | Status: DC

## 2020-08-09 MED ORDER — ALUM & MAG HYDROXIDE-SIMETH 200-200-20 MG/5ML PO SUSP
15.0000 mL | ORAL | Status: DC | PRN
  Administered 2020-08-09: 15 mL via ORAL
  Filled 2020-08-09: qty 30

## 2020-08-09 NOTE — Progress Notes (Addendum)
PROGRESS NOTE  Penny Huerta PPI:[id number] DOB: 1965-10-07 DOA: 07/26/2020 PCP: Patient, No Pcp Per   LOS: 14 days   Brief Narrative / Interim history: 54 year old female with obesity, PAF, HLD, anxiety, COVID-19 infection who came into the hospital and was admitted 11/12 with hypoxia and shortness of breath initially requiring a nonrebreather.  Imaging showed multifocal pneumonia as well as cardiomegaly and vascular congestion consistent with pulmonary edema.  During her hospital stay she developed right upper quadrant abdominal pain  Subjective / 24h Interval events: No shortness of breath, doing better.  Has not been out of bed yet  Assessment & Plan:  Principal Problem Acute Hypoxic Respiratory Failure due to Covid-19 Viral Illness as well as pulmonary edema due to acute on chronic diastolic CHF -This is resolved, she received several days of Lasix, completed Remdesivir as well as 10 days of steroids.  She is on room air  Active Problems Acute pulmonary edema due to acute on chronic diastolic CHF -Underwent a 2D echo which showed grade 1 diastolic dysfunction, LVEF is preserved.  Currently appears on the dry side, and due to orthostatic hypotension and weakness will actually give back some fluids.  Right upper quadrant pain, elevated bilirubin -Right abdominal ultrasound showed a 3.2 cm cholelithiasis without any evidence of cholecystitis, no gallbladder wall thickening, no pericholecystic fluid.  An MRI/MRCP showed a large intramuscular hematoma which is 13 x 12 x 7 cm with edema in the body wall around this hematoma, lesion cannot be excluded due to lack of contrast.  Follow-up imaging will be helpful, radiology recommends an MRI in 4 to 6 weeks -No history of trauma, this appears to be spontaneous.  Hold Lovenox for DVT prophylaxis  Orthostatic hypotension -Still quite symptomatic today also, chloride is still low, continue fluids   PAF -Currently in  sinus  Hyponatremia -Sodium dipped to 123 yesterday, she is hypochloremic and clinically looks dry, improved to 126 with IV fluids, continue and recheck sodium in the afternoon  Morbid obesity -She would benefit from weight loss, BMI 48  Hyperlipidemia -Allergic to statins, outpatient follow-up  DM2, steroid-induced hyperglycemia -A1c 7.6, now she is off steroids, monitor CBGs  CBG (last 3)  Recent Labs    08/08/20 1653 08/08/20 2105 08/09/20 0807  GLUCAP 122* 93 124*    Scheduled Meds: . insulin aspart  0-20 Units Subcutaneous TID WC  . insulin aspart  0-5 Units Subcutaneous QHS  . insulin aspart  10 Units Subcutaneous TID WC  . insulin detemir  50 Units Subcutaneous Daily  . linagliptin  5 mg Oral Daily  . metFORMIN  500 mg Oral BID WC  . ondansetron  4 mg Oral TID  . polyethylene glycol  17 g Oral Daily  . Ensure Max Protein  11 oz Oral BID  . senna-docusate  1 tablet Oral BID  . sodium chloride flush  3 mL Intravenous Q12H   Continuous Infusions: . sodium chloride    . sodium chloride 75 mL/hr at 08/09/20 0200   PRN Meds:.sodium chloride, acetaminophen, albuterol, guaiFENesin-dextromethorphan, ondansetron **OR** ondansetron (ZOFRAN) IV, sodium chloride, sodium chloride flush  DVT prophylaxis: SCDs Code Status: Full code Family Communication: husband Katheline Brendlinger [phone]  Status is: Inpatient  Remains inpatient appropriate because:Inpatient level of care appropriate due to severity of illness   Dispo: The patient is from: Home              Anticipated d/c is to: Home  Anticipated d/c date is: 2 days              Patient currently is not medically stable to d/c.  Consultants:  None   Procedures:  None   Microbiology: None   Antibacterials: None    Objective: Vitals:   08/08/20 1119 08/08/20 2110 08/09/20 0500 08/09/20 0500  BP: 115/74 127/65  (!) 99/54  Pulse: 89 91  89  Resp:    16  Temp: 98.3 F (36.8 C) 98.4 F (36.9 C)   98.5 F (36.9 C)  TempSrc: Oral Oral  Oral  SpO2: 97% 99%  95%  Weight:   (!) 138.4 kg   Height:        Intake/Output Summary (Last 24 hours) at 08/09/2020 1025 Last data filed at 08/09/2020 0500 Gross per 24 hour  Intake 1124.34 ml  Output 1600 ml  Net -475.66 ml   Filed Weights   08/05/20 0600 08/06/20 0500 08/09/20 0500  Weight: (!) 136.4 kg (!) 136.5 kg (!) 138.4 kg    Examination:  Constitutional: No distress Eyes: No icterus ENMT: Mucous membranes are moist.  Neck: normal, supple Respiratory: Clear bilaterally no wheezing, no crackles Cardiovascular: Regular rate and rhythm, no murmurs, no edema, Abdomen: Nondistended, mild tenderness right upper quadrant, bowel sounds positive Musculoskeletal: no clubbing / cyanosis.  Skin: No rashes Neurologic: Nonfocal   Data Reviewed: I have independently reviewed following labs and imaging studies   CBC: Recent Labs  Lab 08/05/20 0442 08/06/20 0429 08/08/20 0414  WBC 20.1* 24.0* 23.9*  NEUTROABS  --  18.4*  --   HGB 14.3 12.7 8.8*  HCT 43.2 37.6 25.7*  MCV 86.7 85.1 85.7  PLT 363 339 271   Basic Metabolic Panel: Recent Labs  Lab 08/05/20 0442 08/06/20 0429 08/08/20 0414 08/08/20 1548 08/09/20 0711  NA 131* 128* 123* 124* 126*  K 3.7 4.5 5.3* 5.3* 4.8  CL 92* 90* 87* 88* 91*  CO2 27 25 26 25 25   GLUCOSE 137* 214* 163* 140* 130*  BUN 46* 44* 42* 34* 31*  CREATININE 0.72 0.77 0.92 0.90 0.86  CALCIUM 8.7* 9.1 9.2 8.8* 8.4*   GFR: Estimated Creatinine Clearance: 108.6 mL/min (by C-G formula based on SCr of 0.86 mg/dL). Liver Function Tests: Recent Labs  Lab 08/05/20 0442 08/06/20 0429 08/08/20 0414  AST 64* 37 22  ALT 98* 75* 40  ALKPHOS 47 49 53  BILITOT 1.4*  1.6* 1.6* 1.1  PROT 7.0 6.9 6.4*  ALBUMIN 3.5 3.4* 2.9*   No results for input(s): LIPASE, AMYLASE in the last 168 hours. No results for input(s): AMMONIA in the last 168 hours. Coagulation Profile: No results for input(s): INR,  PROTIME in the last 168 hours. Cardiac Enzymes: No results for input(s): CKTOTAL, CKMB, CKMBINDEX, TROPONINI in the last 168 hours. BNP (last 3 results) No results for input(s): PROBNP in the last 8760 hours. HbA1C: No results for input(s): HGBA1C in the last 72 hours. CBG: Recent Labs  Lab 08/08/20 0741 08/08/20 1115 08/08/20 1653 08/08/20 2105 08/09/20 0807  GLUCAP 151* 135* 122* 93 124*   Lipid Profile: No results for input(s): CHOL, HDL, LDLCALC, TRIG, CHOLHDL, LDLDIRECT in the last 72 hours. Thyroid Function Tests: No results for input(s): TSH, T4TOTAL, FREET4, T3FREE, THYROIDAB in the last 72 hours. Anemia Panel: No results for input(s): VITAMINB12, FOLATE, FERRITIN, TIBC, IRON, RETICCTPCT in the last 72 hours. Urine analysis:    Component Value Date/Time   BILIRUBINUR Neg 03/10/2013 1510   PROTEINUR Neg  03/10/2013 1510   UROBILINOGEN 0.2 03/10/2013 1510   NITRITE Neg 03/10/2013 1510   LEUKOCYTESUR Negative 03/10/2013 1510   Sepsis Labs: Invalid input(s): PROCALCITONIN, LACTICIDVEN  No results found for this or any previous visit (from the past 240 hour(s)).    Radiology Studies: No results found.  Pamella Pert, MD, PhD [facility]  Between 7 am - 7 pm I am available, please contact me via Amion or Securechat  Between 7 pm - 7 am I am not available, please contact night coverage MD/APP via Amion

## 2020-08-09 NOTE — Plan of Care (Signed)
  Problem: Education: Goal: Knowledge of risk factors and measures for prevention of condition will improve Outcome: Progressing   Problem: Coping: Goal: Psychosocial and spiritual needs will be supported Outcome: Progressing   Problem: Respiratory: Goal: Will maintain a patent airway Outcome: Progressing Goal: Complications related to the disease process, condition or treatment will be avoided or minimized Outcome: Progressing   Problem: Education: Goal: Ability to describe self-care measures that may prevent or decrease complications (Diabetes Survival Skills Education) will improve Outcome: Progressing   Problem: Coping: Goal: Ability to adjust to condition or change in health will improve Outcome: Progressing   Problem: Fluid Volume: Goal: Ability to maintain a balanced intake and output will improve Outcome: Progressing   Problem: Nutritional: Goal: Maintenance of adequate nutrition will improve Outcome: Progressing Goal: Progress toward achieving an optimal weight will improve Outcome: Progressing   

## 2020-08-09 NOTE — Evaluation (Signed)
Occupational Therapy Evaluation Patient Details Name: Penny Huerta MRN: [id number] DOB: 10-07-1965 Today's Date: 08/09/2020    History of Present Illness Patient is a 54 year old female admitted 11/12 PMH of obesity, atrial fibrillation, hyperlipidemia, anxiety who had progressively worsening SOB ~1 week. Patient diagnosed with COVID 11/9. Patient began complaining of abdominal pain and found to have large intramuscular hematoma.   Clinical Impression   Treatment focused on improving activity tolerance and participation in self care tasks. Patient performed bed exercises with blue theraband to work on improving strength and blood pressure. After exercises BP 123/66. Patient able to don socks with increased time in bed. Patient transferred to side of bed with supervision and reports minimal dizziness today. BP 135/88 at side of bed. Patient min guard to transfer to recliner with use of RW. Patient performed grooming and bathing tasks seated in recliner. Patient reports not "Feeling good" but also "feeling better" than she has been. BP 106/59 after grooming tasks. Hope to progress toward goals now that orthostatic BP has improved.    Follow Up Recommendations  Home health OT;Supervision/Assistance - 24 hour    Equipment Recommendations  3 in 1 bedside commode    Recommendations for Other Services       Precautions / Restrictions Precautions Precautions: Fall Precaution Comments: Orthostatic Restrictions Weight Bearing Restrictions: No      Mobility Bed Mobility Overal bed mobility: Needs Assistance Bed Mobility: Supine to Sit   Sidelying to sit: Supervision;HOB elevated       General bed mobility comments: increased time and use of bed rails but no physical assistance. Reports minimal dizziness in sitting.    Transfers Overall transfer level: Needs assistance Equipment used: Rolling walker (2 wheeled) Transfers: Sit to/from [employer] Sit to Stand: Min  guard Stand pivot transfers: Min guard       General transfer comment: Min guard for standing and transfer to recliner today.    Balance   Sitting-balance support: No upper extremity supported;Feet supported Sitting balance-Leahy Scale: Fair     Standing balance support: During functional activity Standing balance-Leahy Scale: Poor Standing balance comment: reliant on UE support                           ADL either performed or assessed with clinical judgement   ADL       Grooming: Brushing hair;Sitting;Set up Grooming Details (indicate cue type and reason): Brushed hair sitting in chair     Lower Body Bathing: Set up;Sitting/lateral leans Lower Body Bathing Details (indicate cue type and reason): partial lower body washing of peri area and thighs in seated position.     Lower Body Dressing: Bed level;Set up Lower Body Dressing Details (indicate cue type and reason): donned socks in bed with increased time.                     Vision Patient Visual Report: No change from baseline       Perception     Praxis      Pertinent Vitals/Pain Pain Assessment: Faces Faces Pain Scale: Hurts a little bit Pain Location: stomach Pain Descriptors / Indicators: Grimacing Pain Intervention(s): Monitored during session     Hand Dominance     Extremity/Trunk Assessment             Communication     Cognition Arousal/Alertness: Awake/alert Behavior During Therapy: WFL for tasks assessed/performed Overall Cognitive Status: Within Functional Limits  for tasks assessed                                     General Comments       Exercises Other Exercises Other Exercises: Shoulder punches ,external rotation reps  x 10 each with each arm, knee extension x 10 each leg, all with blue theraband.   Shoulder Instructions      Home Living                                          Prior Functioning/Environment                    OT Problem List: Decreased activity tolerance;Impaired balance (sitting and/or standing);Cardiopulmonary status limiting activity;Obesity;Decreased knowledge of use of DME or AE;Pain;Decreased strength      OT Treatment/Interventions: Self-care/ADL training;Therapeutic exercise;Energy conservation;DME and/or AE instruction;Therapeutic activities;Patient/family education;Balance training    OT Goals(Current goals can be found in the care plan section) Acute Rehab OT Goals Patient Stated Goal: to be able to walk into her house OT Goal Formulation: With patient Time For Goal Achievement: 08/15/20 Potential to Achieve Goals: Good  OT Frequency: Min 2X/week   Barriers to D/C:            Co-evaluation       OT goals addressed during session: ADL's and self-care (functional mobility, activity tolerance)      AM-PAC OT "6 Clicks" Daily Activity     Outcome Measure Help from another person eating meals?: None Help from another person taking care of personal grooming?: A Little Help from another person toileting, which includes using toliet, bedpan, or urinal?: A Lot Help from another person bathing (including washing, rinsing, drying)?: A Lot Help from another person to put on and taking off regular upper body clothing?: A Little Help from another person to put on and taking off regular lower body clothing?: A Lot 6 Click Score: 16   End of Session Equipment Utilized During Treatment: Rolling walker Nurse Communication: Mobility status (BP)  Activity Tolerance: Patient tolerated treatment well Patient left: in chair;with call bell/phone within reach;with chair alarm set  OT Visit Diagnosis: Other abnormalities of gait and mobility (R26.89);Muscle weakness (generalized) (M62.81)                Time: 4696-2952 OT Time Calculation (min): 18 min Charges:  OT General Charges $OT Visit: 1 Visit OT Treatments $Self Care/Home Management : 8-22 mins  Ahyana Skillin, OTR/L Acute  Care Rehab Services  Office [phone] Pager: [phone]   Kelli Churn 08/09/2020, 4:02 PM

## 2020-08-10 DIAGNOSIS — U071 COVID-19: Secondary | ICD-10-CM | POA: Diagnosis not present

## 2020-08-10 DIAGNOSIS — E785 Hyperlipidemia, unspecified: Secondary | ICD-10-CM | POA: Diagnosis not present

## 2020-08-10 DIAGNOSIS — J9601 Acute respiratory failure with hypoxia: Secondary | ICD-10-CM | POA: Diagnosis not present

## 2020-08-10 DIAGNOSIS — R739 Hyperglycemia, unspecified: Secondary | ICD-10-CM | POA: Diagnosis not present

## 2020-08-10 LAB — TYPE AND SCREEN
ABO/RH(D): O POS
Antibody Screen: NEGATIVE
Unit division: 0

## 2020-08-10 LAB — CBC
HCT: 25.3 % — ABNORMAL LOW (ref 36.0–46.0)
Hemoglobin: 8.3 g/dL — ABNORMAL LOW (ref 12.0–15.0)
MCH: 29.9 pg (ref 26.0–34.0)
MCHC: 32.8 g/dL (ref 30.0–36.0)
MCV: 91 fL (ref 80.0–100.0)
Platelets: 324 10*3/uL (ref 150–400)
RBC: 2.78 MIL/uL — ABNORMAL LOW (ref 3.87–5.11)
RDW: 13.7 % (ref 11.5–15.5)
WBC: 19 10*3/uL — ABNORMAL HIGH (ref 4.0–10.5)
nRBC: 0.2 % (ref 0.0–0.2)

## 2020-08-10 LAB — GLUCOSE, CAPILLARY
Glucose-Capillary: 132 mg/dL — ABNORMAL HIGH (ref 70–99)
Glucose-Capillary: 161 mg/dL — ABNORMAL HIGH (ref 70–99)
Glucose-Capillary: 85 mg/dL (ref 70–99)
Glucose-Capillary: 129 mg/dL — ABNORMAL HIGH (ref 70–99)

## 2020-08-10 LAB — BASIC METABOLIC PANEL
Anion gap: 10 (ref 5–15)
BUN: 19 mg/dL (ref 6–20)
CO2: 24 mmol/L (ref 22–32)
Calcium: 8.7 mg/dL — ABNORMAL LOW (ref 8.9–10.3)
Chloride: 97 mmol/L — ABNORMAL LOW (ref 98–111)
Creatinine, Ser: 0.68 mg/dL (ref 0.44–1.00)
GFR, Estimated: >60 mL/min (ref >60)
Glucose, Bld: 148 mg/dL — ABNORMAL HIGH (ref 70–99)
Potassium: 4.2 mmol/L (ref 3.5–5.1)
Sodium: 131 mmol/L — ABNORMAL LOW (ref 135–145)

## 2020-08-10 LAB — BPAM RBC
Blood Product Expiration Date: 202112282359
ISSUE DATE / TIME: 202111262211
Unit Type and Rh: 5100

## 2020-08-10 MED ORDER — HYOSCYAMINE SULFATE 0.125 MG SL SUBL
0.2500 mg | SUBLINGUAL_TABLET | Freq: Four times a day (QID) | SUBLINGUAL | Status: AC | PRN
  Administered 2020-08-10 (×2): 0.25 mg via SUBLINGUAL
  Filled 2020-08-10 (×3): qty 2

## 2020-08-10 MED ORDER — TRAMADOL HCL 50 MG PO TABS
50.0000 mg | ORAL_TABLET | Freq: Once | ORAL | Status: AC
  Administered 2020-08-10: 50 mg via ORAL
  Filled 2020-08-10: qty 1

## 2020-08-10 MED ORDER — HYOSCYAMINE SULFATE 0.125 MG/ML PO SOLN: 0.2500 mg | Freq: Four times a day (QID) | ORAL | Status: DC | PRN

## 2020-08-10 NOTE — Progress Notes (Signed)
Physical Therapy Treatment Patient Details Name: Penny Huerta MRN: [id number] DOB: June 05, 1966 Today's Date: 08/10/2020    History of Present Illness Patient is a 54 year old female admitted 11/12 PMH of obesity, atrial fibrillation, hyperlipidemia, anxiety who had progressively worsening SOB ~1 week. Patient diagnosed with COVID 11/9. Patient began complaining of abdominal pain and found to have large intramuscular hematoma.    PT Comments    Pt pleasant and motivated.  Assisted OOB.  General transfer comment: pt self ablwe with increased time.  Also assisted with a toilet transfer.  Pt needed elvated commode with handle to rise.  Able to perform self peri care in standing.  Fatigues easily.  Required several short rest breaks. General Gait Details: tolerated an increased diatnce but does fatigue easily recovering from COVID.  Id on RA avg sats >90% (briefly dropped to 86% at worst but avg >90%) Slight c/o dizziness uponmd sitting EOB.  BP 135/94.  Allowed increased time seated to dissipate.  Pt tolerated amb to and from the bathroom a total of 30 feet but did need a walker for support. No supplemental oxygen needed.  Pt hoped to return home soon and take a shower.   Follow Up Recommendations  Home health PT     Equipment Recommendations  Rolling walker with 5" wheels    Recommendations for Other Services       Precautions / Restrictions Precautions Precautions: Fall Precaution Comments: monitor vitals    Mobility  Bed Mobility Overal bed mobility: Needs Assistance Bed Mobility: Supine to Sit     Supine to sit: Supervision     General bed mobility comments: self able with increased time  Transfers Overall transfer level: Needs assistance Equipment used: Rolling walker (2 wheeled) Transfers: Sit to/from [employer] Sit to Stand: Supervision Stand pivot transfers: Supervision       General transfer comment: pt self ablwe with increased time.  Also  assisted with a toilet transfer.  Pt needed elvated commode with handle to rise.  Able to perform self peri care in standing.  Fatigues easily.  Required several short rest breaks.  Ambulation/Gait Ambulation/Gait assistance: Supervision Gait Distance (Feet): 30 Feet (15 feet x 2 to and from bathroo) Assistive device: Rolling walker (2 wheeled) Gait Pattern/deviations: Step-through pattern Gait velocity: decreased   General Gait Details: tolerated an increased diatnce but does fatigue easily recovering from COVID.  Id on RA avg sats >90% (briefly dropped to 86% at worst but avg >90%) Slight c/o dizziness uponmd sitting EOB.  BP 135/94.  Allowed increased time seated to dissipate.  Pt tolerated amb to and from the bathroom a total of 30 feet but did need a walker for support.   Stairs             Wheelchair Mobility    Modified Rankin (Stroke Patients Only)       Balance                                            Cognition Arousal/Alertness: Awake/alert Behavior During Therapy: WFL for tasks assessed/performed Overall Cognitive Status: Within Functional Limits for tasks assessed                                 General Comments: "feeling better" but still having ABD pain that  impeeds her progress      Exercises      General Comments        Pertinent Vitals/Pain Pain Assessment: 0-10 Pain Score: 5  Pain Location: RUQ ABD Pain Descriptors / Indicators: Discomfort;Grimacing Pain Intervention(s): Monitored during session    Home Living                      Prior Function            PT Goals (current goals can now be found in the care plan section) Progress towards PT goals: Progressing toward goals    Frequency    Min 3X/week      PT Plan Current plan remains appropriate    Co-evaluation              AM-PAC PT "6 Clicks" Mobility   Outcome Measure  Help needed turning from your back to your side  while in a flat bed without using bedrails?: A Little Help needed moving from lying on your back to sitting on the side of a flat bed without using bedrails?: A Little Help needed moving to and from a bed to a chair (including a wheelchair)?: A Little Help needed standing up from a chair using your arms (e.g., wheelchair or bedside chair)?: A Little Help needed to walk in hospital room?: A Little Help needed climbing 3-5 steps with a railing? : A Lot 6 Click Score: 17    End of Session Equipment Utilized During Treatment: Gait belt Activity Tolerance: Patient limited by fatigue Patient left: with call bell/phone within reach Nurse Communication: Mobility status PT Visit Diagnosis: Muscle weakness (generalized) (M62.81);Difficulty in walking, not elsewhere classified (R26.2)     Time: 1610-9604 PT Time Calculation (min) (ACUTE ONLY): 44 min  Charges:  $Gait Training: 8-22 mins $Therapeutic Activity: 23-37 mins                     Felecia Shelling  PTA Acute  Rehabilitation Services Pager      [phone] Office      [phone]

## 2020-08-10 NOTE — Progress Notes (Signed)
PROGRESS NOTE  Penny Huerta WCB:[id number] DOB: 28-Apr-1966 DOA: 07/26/2020 PCP: Patient, No Pcp Per   LOS: 15 days   Brief Narrative / Interim history: 54 year old female with obesity, PAF, HLD, anxiety, COVID-19 infection who came into the hospital and was admitted 11/12 with hypoxia and shortness of breath initially requiring a nonrebreather.  Imaging showed multifocal pneumonia as well as cardiomegaly and vascular congestion consistent with pulmonary edema.  During her hospital stay she developed right upper quadrant abdominal pain  Subjective / 24h Interval events: Feels a little bit stronger today but overall extremely weak  Assessment & Plan:  Principal Problem Acute Hypoxic Respiratory Failure due to Covid-19 Viral Illness as well as pulmonary edema due to acute on chronic diastolic CHF -This is resolved, she received several days of Lasix, completed Remdesivir as well as 10 days of steroids.  She is on room air  Active Problems Acute pulmonary edema due to acute on chronic diastolic CHF -Underwent a 2D echo which showed grade 1 diastolic dysfunction, LVEF is preserved.  Was actually diuresed, became dehydrated required IV fluids back.  Currently monitor off Lasix and of fluids  Right upper quadrant pain due to large intramuscular hematoma -Right abdominal ultrasound showed a 3.2 cm cholelithiasis without any evidence of cholecystitis, no gallbladder wall thickening, no pericholecystic fluid.  An MRI/MRCP showed a large intramuscular hematoma which is 13 x 12 x 7 cm with edema in the body wall around this hematoma, lesion cannot be excluded due to lack of contrast.  Follow-up imaging will be helpful, radiology recommends an MRI in 4 to 6 weeks -No history of trauma, this appears to be spontaneous.  Continue to hold Lovenox  Acute blood loss anemia -Likely in the setting of intramuscular hematoma.  Was transfused a unit of packed red blood cells and hemoglobin improved  appropriately  Orthostatic hypotension -Still quite symptomatic today also, chloride is still low, continue fluids   PAF -Currently in sinus  Hyponatremia -Likely in the setting of dehydration, sodium as low as 123, improved with fluids and currently 131.  Morbid obesity -She would benefit from weight loss, BMI 48  Hyperlipidemia -Allergic to statins, outpatient follow-up  DM2, steroid-induced hyperglycemia -A1c 7.6, now she is off steroids, monitor CBGs  CBG (last 3)  Recent Labs    08/09/20 2140 08/10/20 0805 08/10/20 1224  GLUCAP 107* 132* 161*    Scheduled Meds: . sodium chloride   Intravenous Once  . insulin aspart  0-20 Units Subcutaneous TID WC  . insulin aspart  0-5 Units Subcutaneous QHS  . insulin aspart  10 Units Subcutaneous TID WC  . insulin detemir  50 Units Subcutaneous Daily  . linagliptin  5 mg Oral Daily  . metFORMIN  500 mg Oral BID WC  . ondansetron  4 mg Oral TID  . polyethylene glycol  17 g Oral Daily  . Ensure Max Protein  11 oz Oral BID  . senna-docusate  1 tablet Oral BID  . sodium chloride flush  3 mL Intravenous Q12H   Continuous Infusions: . sodium chloride     PRN Meds:.sodium chloride, acetaminophen, albuterol, alum & mag hydroxide-simeth, guaiFENesin-dextromethorphan, hyoscyamine, ondansetron **OR** ondansetron (ZOFRAN) IV, sodium chloride, sodium chloride flush  DVT prophylaxis: SCDs Code Status: Full code Family Communication: non focal   Status is: Inpatient  Remains inpatient appropriate because:Inpatient level of care appropriate due to severity of illness   Dispo: The patient is from: Home  Anticipated d/c is to: Home              Anticipated d/c date is: 2 days              Patient currently is not medically stable to d/c.  Consultants:  None   Procedures:  None   Microbiology: None   Antibacterials: None    Objective: Vitals:   08/09/20 2249 08/10/20 0047 08/10/20 0451 08/10/20 1229  BP:  105/69 (!) 146/86 (!) 150/67 (!) 145/89  Pulse: 96 94 84 91  Resp: 20 18 18    Temp: 98.9 F (37.2 C) 98.2 F (36.8 C) 98.6 F (37 C) 97.9 F (36.6 C)  TempSrc: Oral Oral Oral Oral  SpO2: 94% 99% 94% 96%  Weight:   (!) 139.2 kg   Height:        Intake/Output Summary (Last 24 hours) at 08/10/2020 1329 Last data filed at 08/10/2020 0047 Gross per 24 hour  Intake 286.4 ml  Output 800 ml  Net -513.6 ml   Filed Weights   08/06/20 0500 08/09/20 0500 08/10/20 0451  Weight: (!) 136.5 kg (!) 138.4 kg (!) 139.2 kg    Examination:  Constitutional: No distress Eyes: No icterus ENMT: Mucous membranes are moist.  Neck: normal, supple Respiratory: Clear bilaterally no wheezing, no crackles Cardiovascular: Regular rate and rhythm, no murmurs, no edema, Abdomen: Nondistended, mild tenderness right upper quadrant, bowel sounds positive Musculoskeletal: no clubbing / cyanosis.  Skin: No rashes Neurologic: Nonfocal   Data Reviewed: I have independently reviewed following labs and imaging studies   CBC: Recent Labs  Lab 08/05/20 0442 08/06/20 0429 08/08/20 0414 08/09/20 1655 08/10/20 0530  WBC 20.1* 24.0* 23.9* 18.2* 19.0*  NEUTROABS  --  18.4*  --   --   --   HGB 14.3 12.7 8.8* 7.3* 8.3*  HCT 43.2 37.6 25.7* 22.1* 25.3*  MCV 86.7 85.1 85.7 90.6 91.0  PLT 363 339 271 318 324   Basic Metabolic Panel: Recent Labs  Lab 08/06/20 0429 08/06/20 0429 08/08/20 0414 08/08/20 1548 08/09/20 0711 08/09/20 1655 08/10/20 0530  NA 128*   < > 123* 124* 126* 130* 131*  K 4.5  --  5.3* 5.3* 4.8  --  4.2  CL 90*  --  87* 88* 91*  --  97*  CO2 25  --  26 25 25   --  24  GLUCOSE 214*  --  163* 140* 130*  --  148*  BUN 44*  --  42* 34* 31*  --  19  CREATININE 0.77  --  0.92 0.90 0.86  --  0.68  CALCIUM 9.1  --  9.2 8.8* 8.4*  --  8.7*   < > = values in this interval not displayed.   GFR: Estimated Creatinine Clearance: 117.2 mL/min (by C-G formula based on SCr of 0.68 mg/dL). Liver  Function Tests: Recent Labs  Lab 08/05/20 0442 08/06/20 0429 08/08/20 0414  AST 64* 37 22  ALT 98* 75* 40  ALKPHOS 47 49 53  BILITOT 1.4*  1.6* 1.6* 1.1  PROT 7.0 6.9 6.4*  ALBUMIN 3.5 3.4* 2.9*   No results for input(s): LIPASE, AMYLASE in the last 168 hours. No results for input(s): AMMONIA in the last 168 hours. Coagulation Profile: No results for input(s): INR, PROTIME in the last 168 hours. Cardiac Enzymes: No results for input(s): CKTOTAL, CKMB, CKMBINDEX, TROPONINI in the last 168 hours. BNP (last 3 results) No results for input(s): PROBNP in the last 8760  hours. HbA1C: No results for input(s): HGBA1C in the last 72 hours. CBG: Recent Labs  Lab 08/09/20 1221 08/09/20 1722 08/09/20 2140 08/10/20 0805 08/10/20 1224  GLUCAP 105* 86 107* 132* 161*   Lipid Profile: No results for input(s): CHOL, HDL, LDLCALC, TRIG, CHOLHDL, LDLDIRECT in the last 72 hours. Thyroid Function Tests: No results for input(s): TSH, T4TOTAL, FREET4, T3FREE, THYROIDAB in the last 72 hours. Anemia Panel: No results for input(s): VITAMINB12, FOLATE, FERRITIN, TIBC, IRON, RETICCTPCT in the last 72 hours. Urine analysis:    Component Value Date/Time   BILIRUBINUR Neg 03/10/2013 1510   PROTEINUR Neg 03/10/2013 1510   UROBILINOGEN 0.2 03/10/2013 1510   NITRITE Neg 03/10/2013 1510   LEUKOCYTESUR Negative 03/10/2013 1510   Sepsis Labs: Invalid input(s): PROCALCITONIN, LACTICIDVEN  No results found for this or any previous visit (from the past 240 hour(s)).    Radiology Studies: No results found.  Pamella Pert, MD, PhD [facility]  Between 7 am - 7 pm I am available, please contact me via Amion or Securechat  Between 7 pm - 7 am I am not available, please contact night coverage MD/APP via Amion

## 2020-08-10 NOTE — Progress Notes (Signed)
Pt states that she was worried about having rt sided stomach pain after discharge 11/28. Notified MD, pt is aware and spoke MD regarding pain relief needs.  Val Eagle

## 2020-08-11 DIAGNOSIS — U071 COVID-19: Secondary | ICD-10-CM | POA: Diagnosis not present

## 2020-08-11 DIAGNOSIS — J9601 Acute respiratory failure with hypoxia: Secondary | ICD-10-CM | POA: Diagnosis not present

## 2020-08-11 DIAGNOSIS — R739 Hyperglycemia, unspecified: Secondary | ICD-10-CM | POA: Diagnosis not present

## 2020-08-11 DIAGNOSIS — E785 Hyperlipidemia, unspecified: Secondary | ICD-10-CM | POA: Diagnosis not present

## 2020-08-11 LAB — GLUCOSE, CAPILLARY
Glucose-Capillary: 117 mg/dL — ABNORMAL HIGH (ref 70–99)
Glucose-Capillary: 101 mg/dL — ABNORMAL HIGH (ref 70–99)
Glucose-Capillary: 112 mg/dL — ABNORMAL HIGH (ref 70–99)
Glucose-Capillary: 119 mg/dL — ABNORMAL HIGH (ref 70–99)

## 2020-08-11 MED ORDER — HYOSCYAMINE SULFATE 0.125 MG SL SUBL
0.2500 mg | SUBLINGUAL_TABLET | Freq: Four times a day (QID) | SUBLINGUAL | Status: DC | PRN
  Administered 2020-08-11 – 2020-08-12 (×3): 0.25 mg via SUBLINGUAL
  Filled 2020-08-11 (×4): qty 2

## 2020-08-11 NOTE — Progress Notes (Addendum)
NP [facility] hospitalist notified of patient progress. Hands and feet cool to touch, pulses weak, pt is pale and pulse ox on finger is not picking up well due to this. These are new findings since I took care of this pt last week.  Concern for RUQ hematoma and pt needed blood transfusion. On arrival up until the 22nd wbc was wnl and has been increased since. Pt still feeling RUQ pain/weak.

## 2020-08-11 NOTE — Progress Notes (Signed)
Penny Huerta LTJ:[id number] DOB: June 19, 1966 DOA: 07/26/2020 PCP: Patient, No Pcp Per   LOS: 16 days   Brief Narrative / Interim history: 54 year old female with obesity, PAF, HLD, anxiety, COVID-19 infection who came into the hospital and was admitted 11/12 with hypoxia and shortness of breath initially requiring a nonrebreather.  Imaging showed multifocal pneumonia as well as cardiomegaly and vascular congestion consistent with pulmonary edema.  During her hospital stay she developed right upper quadrant abdominal pain  Subjective / 24h Interval events: Continues to feel weak  Assessment & Plan:  Principal Problem Acute Hypoxic Respiratory Failure due to Covid-19 Viral Illness as well as pulmonary edema due to acute on chronic diastolic CHF -This is resolved, she received several days of Lasix, completed Remdesivir as well as 10 days of steroids.  She is on room air  Active Problems Acute pulmonary edema due to acute on chronic diastolic CHF -Underwent a 2D echo which showed grade 1 diastolic dysfunction, LVEF is preserved.  Was actually diuresed, became dehydrated required IV fluids back.  Currently monitor off Lasix and of fluids  Right upper quadrant pain due to large intramuscular hematoma -Right abdominal ultrasound showed a 3.2 cm cholelithiasis without any evidence of cholecystitis, no gallbladder wall thickening, no pericholecystic fluid.  An MRI/MRCP showed a large intramuscular hematoma which is 13 x 12 x 7 cm with edema in the body wall around this hematoma, lesion cannot be excluded due to lack of contrast.  Follow-up imaging will be helpful, radiology recommends an MRI in 4 to 6 weeks -No history of trauma, this appears to be spontaneous.  Continue to hold Lovenox  Acute blood loss anemia -Likely in the setting of intramuscular hematoma.  Was transfused a unit of packed red blood cells and hemoglobin improved appropriately  Orthostatic hypotension -Still  quite symptomatic today also, chloride is still low, continue fluids   PAF -Currently in sinus  Hyponatremia -Likely in the setting of dehydration, sodium as low as 123, improved with fluids and currently 131.  Morbid obesity -She would benefit from weight loss, BMI 48  Hyperlipidemia -Allergic to statins, outpatient follow-up  DM2, steroid-induced hyperglycemia -A1c 7.6, now she is off steroids, monitor CBGs  CBG (last 3)  Recent Labs    08/10/20 2117 08/11/20 0741 08/11/20 1341  GLUCAP 129* 117* 101*   Disposition -Patient continues to feel extremely weak, relatively frustrated that she cannot do the things that she wants to.  I wonder whether short-term rehab may be of benefit if she still feels she cannot function in her home?  Will discuss with PT  Scheduled Meds: . sodium chloride   Intravenous Once  . insulin aspart  0-20 Units Subcutaneous TID WC  . insulin aspart  0-5 Units Subcutaneous QHS  . insulin aspart  10 Units Subcutaneous TID WC  . insulin detemir  50 Units Subcutaneous Daily  . linagliptin  5 mg Oral Daily  . metFORMIN  500 mg Oral BID WC  . ondansetron  4 mg Oral TID  . polyethylene glycol  17 g Oral Daily  . Ensure Max Protein  11 oz Oral BID  . senna-docusate  1 tablet Oral BID  . sodium chloride flush  3 mL Intravenous Q12H   Continuous Infusions: . sodium chloride     PRN Meds:.sodium chloride, acetaminophen, albuterol, alum & mag hydroxide-simeth, guaiFENesin-dextromethorphan, hyoscyamine, ondansetron **OR** ondansetron (ZOFRAN) IV, sodium chloride, sodium chloride flush  DVT prophylaxis: SCDs Code Status: Full code Family Communication: non  focal   Status is: Inpatient  Remains inpatient appropriate because:Inpatient level of care appropriate due to severity of illness   Dispo: The patient is from: Home              Anticipated d/c is to: Home              Anticipated d/c date is: 2 days              Patient currently is not  medically stable to d/c.  Consultants:  None   Procedures:  None   Microbiology: None   Antibacterials: None    Objective: Vitals:   08/10/20 1229 08/10/20 2200 08/11/20 0601 08/11/20 1345  BP: (!) 145/89 (!) 109/57 (!) 142/97 114/65  Pulse: 91  90 83  Resp:   18 19  Temp: 97.9 F (36.6 C) 98 F (36.7 C) 98.2 F (36.8 C) 99.2 F (37.3 C)  TempSrc: Oral Oral Oral Oral  SpO2: 96% 100% 94% 95%  Weight:   (!) 138 kg   Height:        Intake/Output Summary (Last 24 hours) at 08/11/2020 1349 Last data filed at 08/11/2020 1000 Gross per 24 hour  Intake 120 ml  Output 600 ml  Net -480 ml   Filed Weights   08/09/20 0500 08/10/20 0451 08/11/20 0601  Weight: (!) 138.4 kg (!) 139.2 kg (!) 138 kg    Examination:  Constitutional: NAD Respiratory: Lungs are clear, no wheezing Cardiovascular: Regular rate and rhythm, no murmurs, no edema Abdomen: Nondistended, mild tenderness right upper quadrant, bowel sounds positive    Data Reviewed: I have independently reviewed following labs and imaging studies   CBC: Recent Labs  Lab 08/05/20 0442 08/06/20 0429 08/08/20 0414 08/09/20 1655 08/10/20 0530  WBC 20.1* 24.0* 23.9* 18.2* 19.0*  NEUTROABS  --  18.4*  --   --   --   HGB 14.3 12.7 8.8* 7.3* 8.3*  HCT 43.2 37.6 25.7* 22.1* 25.3*  MCV 86.7 85.1 85.7 90.6 91.0  PLT 363 339 271 318 324   Basic Metabolic Panel: Recent Labs  Lab 08/06/20 0429 08/06/20 0429 08/08/20 0414 08/08/20 1548 08/09/20 0711 08/09/20 1655 08/10/20 0530  NA 128*   < > 123* 124* 126* 130* 131*  K 4.5  --  5.3* 5.3* 4.8  --  4.2  CL 90*  --  87* 88* 91*  --  97*  CO2 25  --  26 25 25   --  24  GLUCOSE 214*  --  163* 140* 130*  --  148*  BUN 44*  --  42* 34* 31*  --  19  CREATININE 0.77  --  0.92 0.90 0.86  --  0.68  CALCIUM 9.1  --  9.2 8.8* 8.4*  --  8.7*   < > = values in this interval not displayed.   GFR: Estimated Creatinine Clearance: 116.6 mL/min (by C-G formula based on SCr of  0.68 mg/dL). Liver Function Tests: Recent Labs  Lab 08/05/20 0442 08/06/20 0429 08/08/20 0414  AST 64* 37 22  ALT 98* 75* 40  ALKPHOS 47 49 53  BILITOT 1.4*  1.6* 1.6* 1.1  PROT 7.0 6.9 6.4*  ALBUMIN 3.5 3.4* 2.9*   No results for input(s): LIPASE, AMYLASE in the last 168 hours. No results for input(s): AMMONIA in the last 168 hours. Coagulation Profile: No results for input(s): INR, PROTIME in the last 168 hours. Cardiac Enzymes: No results for input(s): CKTOTAL, CKMB, CKMBINDEX, TROPONINI in  the last 168 hours. BNP (last 3 results) No results for input(s): PROBNP in the last 8760 hours. HbA1C: No results for input(s): HGBA1C in the last 72 hours. CBG: Recent Labs  Lab 08/10/20 1224 08/10/20 1643 08/10/20 2117 08/11/20 0741 08/11/20 1341  GLUCAP 161* 85 129* 117* 101*   Lipid Profile: No results for input(s): CHOL, HDL, LDLCALC, TRIG, CHOLHDL, LDLDIRECT in the last 72 hours. Thyroid Function Tests: No results for input(s): TSH, T4TOTAL, FREET4, T3FREE, THYROIDAB in the last 72 hours. Anemia Panel: No results for input(s): VITAMINB12, FOLATE, FERRITIN, TIBC, IRON, RETICCTPCT in the last 72 hours. Urine analysis:    Component Value Date/Time   BILIRUBINUR Neg 03/10/2013 1510   PROTEINUR Neg 03/10/2013 1510   UROBILINOGEN 0.2 03/10/2013 1510   NITRITE Neg 03/10/2013 1510   LEUKOCYTESUR Negative 03/10/2013 1510   Sepsis Labs: Invalid input(s): PROCALCITONIN, LACTICIDVEN  No results found for this or any previous visit (from the past 240 hour(s)).    Radiology Studies: No results found.  Pamella Pert, MD, PhD [facility]  Between 7 am - 7 pm I am available, please contact me via Amion or Securechat  Between 7 pm - 7 am I am not available, please contact night coverage MD/APP via Amion

## 2020-08-12 DIAGNOSIS — U071 COVID-19: Secondary | ICD-10-CM | POA: Diagnosis not present

## 2020-08-12 DIAGNOSIS — R1011 Right upper quadrant pain: Secondary | ICD-10-CM | POA: Diagnosis not present

## 2020-08-12 DIAGNOSIS — I480 Paroxysmal atrial fibrillation: Secondary | ICD-10-CM | POA: Diagnosis not present

## 2020-08-12 DIAGNOSIS — E785 Hyperlipidemia, unspecified: Secondary | ICD-10-CM | POA: Diagnosis not present

## 2020-08-12 LAB — GLUCOSE, CAPILLARY
Glucose-Capillary: 103 mg/dL — ABNORMAL HIGH (ref 70–99)
Glucose-Capillary: 135 mg/dL — ABNORMAL HIGH (ref 70–99)

## 2020-08-12 MED ORDER — BLOOD GLUCOSE MONITOR KIT: PACK | 0 refills | Status: DC

## 2020-08-12 MED ORDER — ONDANSETRON HCL 4 MG PO TABS: 4.0000 mg | ORAL_TABLET | Freq: Four times a day (QID) | ORAL | 0 refills | Status: DC | PRN

## 2020-08-12 MED ORDER — METFORMIN HCL 500 MG PO TABS: 500.0000 mg | ORAL_TABLET | Freq: Two times a day (BID) | ORAL | 0 refills | Status: DC

## 2020-08-12 MED ORDER — OXYCODONE HCL 5 MG PO TABS
5.0000 mg | ORAL_TABLET | Freq: Four times a day (QID) | ORAL | 0 refills | Status: DC | PRN
Start: 2020-08-12 — End: 2020-11-25

## 2020-08-12 MED ORDER — HYOSCYAMINE SULFATE 0.125 MG SL SUBL
0.2500 mg | SUBLINGUAL_TABLET | Freq: Four times a day (QID) | SUBLINGUAL | 0 refills | Status: DC | PRN
Start: 2020-08-12 — End: 2020-11-25

## 2020-08-12 NOTE — Progress Notes (Signed)
Physical Therapy Treatment Patient Details Name: Penny Huerta MRN: [id number] DOB: 01-16-1966 Today's Date: 08/12/2020    History of Present Illness Patient is a 54 year old female admitted 11/12 PMH of obesity, atrial fibrillation, hyperlipidemia, anxiety who had progressively worsening SOB ~1 week. Patient diagnosed with COVID 11/9. Patient began complaining of abdominal pain and found to have large intramuscular hematoma.    PT Comments     Patient received seated in shower. Patient ambulated in room, then dressed self. Noted minimal Dyspnea on RA. Marland Kitchen Patient is to Dc home today.   Follow Up Recommendations  Home health PT     Equipment Recommendations  Rolling walker with 5" wheels    Recommendations for Other Services       Precautions / Restrictions Precautions Precaution Comments: monitor vitals    Mobility  Bed Mobility               General bed mobility comments: in shower, seated on bench  Transfers   Equipment used: Rolling walker (2 wheeled) Transfers: Sit to/from Stand   Stand pivot transfers: Min guard       General transfer comment: patient on wet surface so provided min guard, close  Ambulation/Gait Ambulation/Gait assistance: Supervision Gait Distance (Feet): 20 Feet Assistive device: Rolling walker (2 wheeled) Gait Pattern/deviations: Step-through pattern     General Gait Details: slow and steady with RW.   Stairs             Wheelchair Mobility    Modified Rankin (Stroke Patients Only)       Balance     Sitting balance-Leahy Scale: Fair     Standing balance support: During functional activity;Single extremity supported Standing balance-Leahy Scale: Fair                              Cognition Arousal/Alertness: Awake/alert Behavior During Therapy: WFL for tasks assessed/performed                                          Exercises      General Comments        Pertinent  Vitals/Pain Pain Assessment: No/denies pain    Home Living                      Prior Function            PT Goals (current goals can now be found in the care plan section) Progress towards PT goals: Progressing toward goals    Frequency    Min 3X/week      PT Plan Current plan remains appropriate    Co-evaluation              AM-PAC PT "6 Clicks" Mobility   Outcome Measure  Help needed turning from your back to your side while in a flat bed without using bedrails?: None Help needed moving from lying on your back to sitting on the side of a flat bed without using bedrails?: None Help needed moving to and from a bed to a chair (including a wheelchair)?: A Little Help needed standing up from a chair using your arms (e.g., wheelchair or bedside chair)?: A Little Help needed to walk in hospital room?: A Little Help needed climbing 3-5 steps with a railing? : A Lot 6 Click Score: 19  End of Session   Activity Tolerance: Patient tolerated treatment well Patient left: in bed;with call bell/phone within reach Nurse Communication: Mobility status PT Visit Diagnosis: Muscle weakness (generalized) (M62.81);Difficulty in walking, not elsewhere classified (R26.2)     Time: 1771-1657 PT Time Calculation (min) (ACUTE ONLY): 20 min  Charges:  $Gait Training: 8-22 mins                     Blanchard Kelch PT Acute Rehabilitation Services Pager [phone] Office [phone]    Rada Hay 08/12/2020, 4:36 PM

## 2020-08-12 NOTE — Progress Notes (Addendum)
Patient states her husband will be able to help her walk more at home. Patient is encouraged to call and get up to go to bathroom but request the purewick to be in place at night even though it doesn't work for her. Patient can feel when wet but does not call to be changed. Pt educated about this multiple times and insist on purewick.  Patient states she does not have sensation when you touch RUQ which she states is new for her. Wbc remain elveated and hgb low, different from arrival labs which were wnl.

## 2020-08-12 NOTE — Discharge Summary (Signed)
Physician Discharge Summary  Penny Huerta ZOX:[id number] DOB: 05/30/1966 DOA: 07/26/2020  PCP: Patient, No Pcp Per  Admit date: 07/26/2020 Discharge date: 08/12/2020  Admitted From: home Disposition:  home  Recommendations for Outpatient Follow-up:  1. Follow up with PCP in 1-2 weeks  Home Health: PT, OT Equipment/Devices: walker  Discharge Condition: stable CODE STATUS: Full code Diet recommendation: diabetic   HPI: Per admitting MD, This is a 54 year old female who was not vaccinated against COVID-19 with past medical history of obesity, atrial fibrillation not on any medications, hyperlipidemia, anxiety who had progressively worsening shortness of breath over the past week.  Started with change in taste on Saturday followed by fever on Sunday which has been intermittent throughout the week up to 102 F.  She tested positive for COVID-19 on Tuesday and received monoclonal antibody yesterday, Thursday.  Today her shortness of breath had become much more significant and she contacted 911.  Per EMS SPO2 at 70% on room air requiring NRB and given Solu-Medrol.  She was also in active A. fib at the time.  Denies any chest pain, nausea or vomiting but admits to diarrhea.  Very distant history of tobacco use, no alcohol use, no asthma or COPD or VTE's.  Hospital Course / Discharge diagnoses:  Principal Problem Acute Hypoxic Respiratory Failure due to Covid-19 Viral Illness as well as pulmonary edema due to acute on chronic diastolic CHF -This is resolved, she received several days of Lasix, completed Remdesivir as well as 10 days of steroids.  She is on room air  Active Problems Acute pulmonary edema due to acute on chronic diastolic CHF -Underwent a 2D echo which showed grade 1 diastolic dysfunction, LVEF is preserved.  Was actually diuresed, became dehydrated required IV fluids back.  Currently euvolemic Right upper quadrant pain due to large intramuscular hematoma -Right abdominal  ultrasound showed a 3.2 cm cholelithiasis without any evidence of cholecystitis, no gallbladder wall thickening, no pericholecystic fluid.  An MRI/MRCP showed a large intramuscular hematoma which is 13 x 12 x 7 cm with edema in the body wall around this hematoma, lesion cannot be excluded due to lack of contrast.  Follow-up imaging will be helpful, radiology recommends an MRI in 4 to 6 weeks.  Case was discussed with Dr. Vedia CofferBlack monitor general surgery. No history of trauma, this appears to be spontaneous. Acute blood loss anemia -Likely in the setting of intramuscular hematoma.  Was transfused a unit of packed red blood cells and hemoglobin improved appropriately Orthostatic hypotension -resolved with fluids PAF -Currently in sinus Hyponatremia -Likely in the setting of dehydration, sodium as low as 123, improved with fluids Morbid obesity -She would benefit from weight loss, BMI 48 Hyperlipidemia -Allergic to statins, outpatient follow-up DM2, steroid-induced hyperglycemia -A1c 7.6, now she is off steroids, start Metformin. Outpatient follow up  Discharge Instructions   Allergies as of 08/12/2020      Reactions   Alprazolam Other (See Comments)   Caused mood changes   Citalopram Hydrobromide Other (See Comments)   Mood changes   Nitrofurantoin Monohyd Macro Nausea And Vomiting   Statins Other (See Comments)   MUSCLE LOSS    Sulfa Drugs Cross Reactors Nausea And Vomiting      Medication List    STOP taking these medications   azithromycin 250 MG tablet Commonly known as: ZITHROMAX   dexamethasone 4 MG tablet Commonly known as: DECADRON     TAKE these medications   albuterol 108 (90 Base) MCG/ACT inhaler Commonly known  as: VENTOLIN HFA Inhale 1-2 puffs into the lungs every 6 (six) hours as needed for wheezing or shortness of breath.   benzonatate 100 MG capsule Commonly known as: TESSALON Take 1 capsule (100 mg total) by mouth every 8 (eight) hours.   hyoscyamine 0.125 MG SL  tablet Commonly known as: LEVSIN SL Place 2 tablets (0.25 mg total) under the tongue every 6 (six) hours as needed (abdominal pain).   ibuprofen 200 MG tablet Commonly known as: ADVIL Take 800 mg by mouth every 6 (six) hours as needed for fever, headache or mild pain.   metFORMIN 500 MG tablet Commonly known as: GLUCOPHAGE Take 1 tablet (500 mg total) by mouth 2 (two) times daily with a meal.   oxyCODONE 5 MG immediate release tablet Commonly known as: Roxicodone Take 1 tablet (5 mg total) by mouth every 6 (six) hours as needed for severe pain.   polyvinyl alcohol 1.4 % ophthalmic solution Commonly known as: LIQUIFILM TEARS Place 1 drop into both eyes as needed for dry eyes.            Durable Medical Equipment  (From admission, onward)         Start     Ordered   08/10/20 1244  For home use only DME Walker  Once       Question:  Patient needs a walker to treat with the following condition  Answer:  Acute hypoxemic respiratory failure due to COVID-19 [facility])   08/10/20 1244          Follow-up Information    [facility]. Schedule an appointment as soon as possible for a visit in 1 week(s).   Contact information: [address] [place] [address] [phone]              Consultations: None   Procedures/Studies:  MR ABDOMEN WO CONTRAST  Result Date: 08/06/2020 CLINICAL DATA:  RIGHT upper quadrant pain for 1 day EXAM: MRI ABDOMEN WITHOUT CONTRAST TECHNIQUE: Multiplanar multisequence MR imaging was performed without the administration of intravenous contrast. Exam was terminated at the request of the patient. Noncontrast images were obtained of the abdomen. No biliary evaluation or contrasted imaging was performed. COMPARISON:  Ultrasound of the abdomen dated August 05, 2020 FINDINGS: Lower chest: Incidental imaging of the lung bases with signs of basilar airspace disease incompletely evaluated, perhaps  atelectasis. Hepatobiliary: Hepatic steatosis, at least moderate. Assessment limited by lack of contrast and respiratory motion. Cholelithiasis. No biliary duct dilation. The large gallstone in the gallbladder lumen. No pericholecystic stranding. Pancreas: Mild heterogeneity in the neck of the pancreas (image 27 of series 3) area measuring approximately 1.8 cm on T2 weighted imaging. No gross peripancreatic fluid. Question mild heterogeneity elsewhere in the pancreas. No ductal dilation. Spleen: Normal in size and contour. No focal splenic lesion. Low signal on T2 raising the question of iron deposition in the spleen. Note that in and out of phase imaging is limited by marked artifact in the area of the spleen not allowing for definitive assessment. Adrenals/Urinary Tract: Adrenal glands are normal. Kidneys are normal in size and contour. No gross renal lesion. Stomach/Bowel: Limited assessment of the gastrointestinal tract without is acute process. Bowel is incompletely imaged. Vascular/Lymphatic: Vascular structures of normal caliber. There is no gastrohepatic or hepatoduodenal ligament lymphadenopathy. No retroperitoneal or mesenteric lymphadenopathy. Limited assessment of vascular structures due to lack of intravenous contrast. Other: Large intramuscular hematoma approximately 13 x 12 x 7 cm with edema  in the body wall around this hematoma. Mixed signal on T2 weighted imaging with some areas of mild increased T1 signal. Musculoskeletal: RIGHT rectus hematoma. No focal, suspicious bone abnormality. IMPRESSION: 1. Large intramuscular hematoma approximately 13 x 12 x 7 cm with edema in the body wall around this hematoma. Given lack of contrast a lesion in this location is not excluded on the basis of the current study. Follow-up may be helpful to track for resolution exclude the remote possibility that there is an underlying lesion. 2. Mild heterogeneity in the neck of the pancreas measuring approximately 1.8 cm  area on T2 weighted imaging. This is not well evaluated given some artifact on the current study and respiratory motion on T1 weighted imaging. Would suggest follow-up MRI in 4-6 weeks for dedicated pancreatic assessment to exclude underlying lesion. Would also correlate with lipase in the meantime to exclude the possibility of mild pancreatitis. 3. Above findings could be followed with either CT or MRI, MRI would be preferred. 4. Low signal on T2 raising the question of iron deposition in the spleen. Correlate with any history of exogenous iron administration. 5. Hepatic steatosis, at least moderate. 6. Cholelithiasis. No biliary duct dilation or pericholecystic stranding. 7. Bibasilar airspace disease, incompletely evaluated, perhaps atelectasis. These results were called by telephone at the time of interpretation on 08/06/2020 at 8:49 pm to provider Webb Silversmith, NP, who verbally acknowledged these results. Electronically Signed   By: Donzetta Kohut M.D.   On: 08/06/2020 20:49   DG Chest Port 1 View  Result Date: 07/26/2020 CLINICAL DATA:  Shortness of breath with cough.  COVID-19 positive EXAM: PORTABLE CHEST 1 VIEW COMPARISON:  None. FINDINGS: There is cardiomegaly with pulmonary vascular congestion. There is airspace opacity throughout the lungs bilaterally, somewhat more on the left than on the right and most notable in the left mid lung and both base regions. No adenopathy evident. No bone lesions. IMPRESSION: Cardiomegaly with pulmonary vascular congestion. Multifocal areas of airspace opacity likely due to multifocal pneumonia. It should be noted that pulmonary edema could present similarly. Both pneumonia and pulmonary edema may be present concurrently. Electronically Signed   By: Bretta Bang III M.D.   On: 07/26/2020 13:51   ECHOCARDIOGRAM COMPLETE  Result Date: 07/27/2020    ECHOCARDIOGRAM REPORT   Patient Name:   ETOY MCDONNELL Date of Exam: 07/27/2020 Medical Rec #:  [id number]      Height:       66.0 in Accession #:    [id number]    Weight:       300.0 lb Date of Birth:  03-Jun-1966     BSA:          2.376 m Patient Age:    54 years      BP:           160/81 mmHg Patient Gender: F             HR:           75 bpm. Exam Location:  Inpatient Procedure: 2D Echo, Cardiac Doppler and Color Doppler Indications:    I50.23 Acute on chronic systolic (congestive) heart failure  History:        Patient has prior history of Echocardiogram examinations, most                 recent 03/10/2011. Arrythmias:Atrial Fibrillation,                 Signs/Symptoms:Murmur; Risk Factors:Dyslipidemia. COVID-19  Positive.  Sonographer:    Elmarie Shiley Dance Referring Phys: [id number] IMPRESSIONS  1. Left ventricular ejection fraction, by estimation, is 55 to 60%. The left ventricle has normal function. The left ventricle has no regional wall motion abnormalities. Left ventricular diastolic parameters are consistent with Grade I diastolic dysfunction (impaired relaxation).  2. Right ventricular systolic function is normal. The right ventricular size is normal.  3. The mitral valve is normal in structure. No evidence of mitral valve regurgitation. No evidence of mitral stenosis.  4. The aortic valve has an indeterminant number of cusps. Aortic valve regurgitation is not visualized. No aortic stenosis is present.  5. The inferior vena cava is dilated in size with >50% respiratory variability, suggesting right atrial pressure of 8 mmHg. FINDINGS  Left Ventricle: Left ventricular ejection fraction, by estimation, is 55 to 60%. The left ventricle has normal function. The left ventricle has no regional wall motion abnormalities. The left ventricular internal cavity size was normal in size. There is  no left ventricular hypertrophy. Left ventricular diastolic parameters are consistent with Grade I diastolic dysfunction (impaired relaxation). Right Ventricle: The right ventricular size is normal. Right ventricular  systolic function is normal. Left Atrium: Left atrial size was normal in size. Right Atrium: Right atrial size was normal in size. Pericardium: There is no evidence of pericardial effusion. Mitral Valve: The mitral valve is normal in structure. Mild mitral annular calcification. No evidence of mitral valve regurgitation. No evidence of mitral valve stenosis. Tricuspid Valve: The tricuspid valve is normal in structure. Tricuspid valve regurgitation is trivial. No evidence of tricuspid stenosis. Aortic Valve: The aortic valve has an indeterminant number of cusps. Aortic valve regurgitation is not visualized. No aortic stenosis is present. Pulmonic Valve: The pulmonic valve was not well visualized. Pulmonic valve regurgitation is not visualized. No evidence of pulmonic stenosis. Aorta: The aortic root is normal in size and structure. Venous: The inferior vena cava is dilated in size with greater than 50% respiratory variability, suggesting right atrial pressure of 8 mmHg. IAS/Shunts: No atrial level shunt detected by color flow Doppler.  LEFT VENTRICLE PLAX 2D LVIDd:         5.30 cm  Diastology LVIDs:         3.40 cm  LV e' medial:    7.29 cm/s LV PW:         1.10 cm  LV E/e' medial:  15.4 LV IVS:        1.10 cm  LV e' lateral:   5.00 cm/s LVOT diam:     1.80 cm  LV E/e' lateral: 22.4 LV SV:         53 LV SV Index:   22 LVOT Area:     2.54 cm  RIGHT VENTRICLE             IVC RV Basal diam:  2.20 cm     IVC diam: 2.50 cm RV S prime:     17.30 cm/s TAPSE (M-mode): 1.8 cm LEFT ATRIUM             Index       RIGHT ATRIUM           Index LA diam:        4.00 cm 1.68 cm/m  RA Area:     19.70 cm LA Vol (A2C):   72.6 ml 30.56 ml/m RA Volume:   57.90 ml  24.37 ml/m LA Vol (A4C):   54.7 ml 23.02 ml/m LA  Biplane Vol: 65.7 ml 27.65 ml/m  AORTIC VALVE LVOT Vmax:   94.80 cm/s LVOT Vmean:  69.000 cm/s LVOT VTI:    0.210 m  AORTA Ao Root diam: 3.00 cm Ao Asc diam:  3.00 cm MITRAL VALVE MV Area (PHT): 2.76 cm     SHUNTS MV  Decel Time: 275 msec     Systemic VTI:  0.21 m MV E velocity: 112.00 cm/s  Systemic Diam: 1.80 cm MV A velocity: 109.00 cm/s MV E/A ratio:  1.03 Olga Millers MD Electronically signed by Olga Millers MD Signature Date/Time: 07/27/2020/3:00:21 PM    Final    US Abdomen Limited RUQ (LIVER/GB)  Result Date: 08/05/2020 CLINICAL DATA:  Right upper quadrant pain for 1 day. EXAM: ULTRASOUND ABDOMEN LIMITED RIGHT UPPER QUADRANT COMPARISON:  None. FINDINGS: Gallbladder: Cholelithiasis with large gallstone measuring 3.2 cm in diameter. No gallbladder wall thickening or edema. Murphy's sign is negative. Common bile duct: Diameter: 4 mm, normal. The bile duct is not visualized in its entirety. Liver: Diffusely increased and heterogeneous appearance of the liver echotexture likely representing diffuse fatty infiltration. No focal lesions identified. Portal vein is patent on color Doppler imaging with normal direction of blood flow towards the liver. Other: None. IMPRESSION: 1. Cholelithiasis without evidence of acute cholecystitis. 2. Diffuse fatty infiltration of the liver. Electronically Signed   By: Burman Nieves M.D.   On: 08/05/2020 06:58      Subjective: - no chest pain, shortness of breath, no abdominal pain, nausea or vomiting.   Discharge Exam: BP 127/67 (BP Location: Left Arm)   Pulse 84   Temp 98.6 F (37 C) (Oral)   Resp 16   Ht 5\' 6"  (1.676 m)   Wt (!) 138 kg   LMP 01/06/2012   SpO2 94%   BMI 49.10 kg/m   General: Pt is alert, awake, not in acute distress Cardiovascular: RRR, S1/S2 +, no rubs, no gallops Respiratory: CTA bilaterally, no wheezing, no rhonchi Abdominal: Soft, NT, ND, bowel sounds + Extremities: no edema, no cyanosis    The results of significant diagnostics from this hospitalization (including imaging, microbiology, ancillary and laboratory) are listed below for reference.     Microbiology: No results found for this or any previous visit (from the past 240  hour(s)).   Labs: Basic Metabolic Panel: Recent Labs  Lab 08/06/20 0429 08/06/20 0429 08/08/20 0414 08/08/20 1548 08/09/20 0711 08/09/20 1655 08/10/20 0530  NA 128*   < > 123* 124* 126* 130* 131*  K 4.5  --  5.3* 5.3* 4.8  --  4.2  CL 90*  --  87* 88* 91*  --  97*  CO2 25  --  26 25 25   --  24  GLUCOSE 214*  --  163* 140* 130*  --  148*  BUN 44*  --  42* 34* 31*  --  19  CREATININE 0.77  --  0.92 0.90 0.86  --  0.68  CALCIUM 9.1  --  9.2 8.8* 8.4*  --  8.7*   < > = values in this interval not displayed.   Liver Function Tests: Recent Labs  Lab 08/06/20 0429 08/08/20 0414  AST 37 22  ALT 75* 40  ALKPHOS 49 53  BILITOT 1.6* 1.1  PROT 6.9 6.4*  ALBUMIN 3.4* 2.9*   CBC: Recent Labs  Lab 08/06/20 0429 08/08/20 0414 08/09/20 1655 08/10/20 0530  WBC 24.0* 23.9* 18.2* 19.0*  NEUTROABS 18.4*  --   --   --  HGB 12.7 8.8* 7.3* 8.3*  HCT 37.6 25.7* 22.1* 25.3*  MCV 85.1 85.7 90.6 91.0  PLT 339 271 318 324   CBG: Recent Labs  Lab 08/11/20 0741 08/11/20 1341 08/11/20 1700 08/11/20 2031 08/12/20 0815  GLUCAP 117* 101* 112* 119* 103*   Hgb A1c No results for input(s): HGBA1C in the last 72 hours. Lipid Profile No results for input(s): CHOL, HDL, LDLCALC, TRIG, CHOLHDL, LDLDIRECT in the last 72 hours. Thyroid function studies No results for input(s): TSH, T4TOTAL, T3FREE, THYROIDAB in the last 72 hours.  Invalid input(s): FREET3 Urinalysis    Component Value Date/Time   BILIRUBINUR Neg 03/10/2013 1510   PROTEINUR Neg 03/10/2013 1510   UROBILINOGEN 0.2 03/10/2013 1510   NITRITE Neg 03/10/2013 1510   LEUKOCYTESUR Negative 03/10/2013 1510    FURTHER DISCHARGE INSTRUCTIONS:   Get Medicines reviewed and adjusted: Please take all your medications with you for your next visit with your Primary MD   Laboratory/radiological data: Please request your Primary MD to go over all hospital tests and procedure/radiological results at the follow up, please ask your  Primary MD to get all Hospital records sent to his/her office.   In some cases, they will be blood work, cultures and biopsy results pending at the time of your discharge. Please request that your primary care M.D. goes through all the records of your hospital data and follows up on these results.   Also Note the following: If you experience worsening of your admission symptoms, develop shortness of breath, life threatening emergency, suicidal or homicidal thoughts you must seek medical attention immediately by calling 911 or calling your MD immediately  if symptoms less severe.   You must read complete instructions/literature along with all the possible adverse reactions/side effects for all the Medicines you take and that have been prescribed to you. Take any new Medicines after you have completely understood and accpet all the possible adverse reactions/side effects.    Do not drive when taking Pain medications or sleeping medications (Benzodaizepines)   Do not take more than prescribed Pain, Sleep and Anxiety Medications. It is not advisable to combine anxiety,sleep and pain medications without talking with your primary care practitioner   Special Instructions: If you have smoked or chewed Tobacco  in the last 2 yrs please stop smoking, stop any regular Alcohol  and or any Recreational drug use.   Wear Seat belts while driving.   Please note: You were cared for by a hospitalist during your hospital stay. Once you are discharged, your primary care physician will handle any further medical issues. Please note that NO REFILLS for any discharge medications will be authorized once you are discharged, as it is imperative that you return to your primary care physician (or establish a relationship with a primary care physician if you do not have one) for your post hospital discharge needs so that they can reassess your need for medications and monitor your lab values.  Time coordinating discharge: 35  minutes  SIGNED:  Pamella Pert, MD, PhD 08/12/2020, 9:40 AM

## 2020-08-19 ENCOUNTER — Ambulatory Visit: Payer: 59

## 2020-11-25 ENCOUNTER — Other Ambulatory Visit: Payer: Self-pay

## 2020-11-25 ENCOUNTER — Ambulatory Visit: Payer: 59 | Admitting: Family Medicine

## 2020-11-25 ENCOUNTER — Encounter: Payer: Self-pay | Admitting: Family Medicine

## 2020-11-25 VITALS — BP 155/81 | HR 88 | Ht 66.0 in | Wt 317.0 lb

## 2020-11-25 DIAGNOSIS — S301XXS Contusion of abdominal wall, sequela: Secondary | ICD-10-CM

## 2020-11-25 DIAGNOSIS — R739 Hyperglycemia, unspecified: Secondary | ICD-10-CM | POA: Diagnosis not present

## 2020-11-25 DIAGNOSIS — F419 Anxiety disorder, unspecified: Secondary | ICD-10-CM

## 2020-11-25 DIAGNOSIS — G8929 Other chronic pain: Secondary | ICD-10-CM

## 2020-11-25 DIAGNOSIS — Z7689 Persons encountering health services in other specified circumstances: Secondary | ICD-10-CM

## 2020-11-25 DIAGNOSIS — E785 Hyperlipidemia, unspecified: Secondary | ICD-10-CM

## 2020-11-25 DIAGNOSIS — R5382 Chronic fatigue, unspecified: Secondary | ICD-10-CM

## 2020-11-25 DIAGNOSIS — Z8679 Personal history of other diseases of the circulatory system: Secondary | ICD-10-CM | POA: Diagnosis not present

## 2020-11-25 DIAGNOSIS — J302 Other seasonal allergic rhinitis: Secondary | ICD-10-CM

## 2020-11-25 DIAGNOSIS — Z8616 Personal history of COVID-19: Secondary | ICD-10-CM

## 2020-11-25 DIAGNOSIS — M545 Low back pain, unspecified: Secondary | ICD-10-CM

## 2020-11-25 DIAGNOSIS — Z6841 Body Mass Index (BMI) 40.0 and over, adult: Secondary | ICD-10-CM

## 2020-11-25 NOTE — Progress Notes (Signed)
Office Visit Note   Patient: Penny Huerta           Date of Birth: 01-Nov-1965           MRN: [id number] Visit Date: 11/25/2020 Requested by: No referring provider defined for this encounter. PCP: Lavada Mesi, MD  Subjective: Chief Complaint  Patient presents with  . Other    Establish primary care    HPI: She is here to establish care.  In November she was hospitalized for 3 weeks with COVID-19.  During the hospitalization she was diagnosed with diabetes, with an A1c of 7.6.  She has been on Metformin since then.  Her blood sugars have been erratic, sometimes ranging as high as 400.  She was asymptomatic at that level.  Occasionally it is below 100, and she is generally asymptomatic at that level as well.  Denies any visual disturbance, polyuria or polydipsia.  No numbness in the extremities.  Her mother has diabetes.  Patient states that in 2007 she was told that she had prediabetes but she has been pretty much ignoring it.  Around that time she was told she had hyperlipidemia and took Lipitor, but it caused severe myalgias.  She has had atrial fibrillation.  Generally her heart rhythm has been normal.  She is asymptomatic from that standpoint today.  She has struggled with fatigue and chronic low back pain, worse since her hospitalization in November.  She walks with a cane, it is difficult for her to go places because of her tiredness.  While in the hospital she developed pain in the right upper quadrant abdomen.  She had an MRI scan which showed a hematoma intramuscular.  She never had a bruise.  There was never any trauma that she could think of.  It still feels like it is present.                 ROS:   All other systems were reviewed and are negative.  Objective: Vital Signs: BP (!) 155/81   Pulse 88   Ht 5\' 6"  (1.676 m)   Wt (!) 317 lb (143.8 kg)   LMP 01/06/2012   BMI 51.17 kg/m   Physical Exam:  General:  Alert and oriented, in no acute distress. Pulm:   Breathing unlabored. Psy:  Normal mood, congruent affect. Skin:  No rash  Neck:  Slight thyromegaly, no bruits. CV: Regular rate and rhythm without murmurs, rubs, or gallops.  No peripheral edema.  2+ radial and posterior tibial pulses. Lungs: Clear to auscultation throughout with no wheezing or areas of consolidation. Abdomen: No fluctuance in the right upper quadrant.  No erythema or bruising. Low back: Tender near the L5-S1 level in the midline.    Imaging: No results found.  Assessment & Plan: 1.  Probable diabetes -Labs to evaluate today.  Consider nutrition consult.  She has been following a ketogenic diet but does not feel like it is making any difference. -Continue with Metformin for now.  2.  Chronic fatigue -Labs to evaluate.  Physical therapy referral.  3.  Chronic low back pain -Physical therapy referral.  4.  Abdominal wall hematoma -Trial of therapeutic ultrasound.  If symptoms persist, repeat imaging.  5.  History of hyperlipidemia -She does not want another statin.  6.  History of atrial fibrillation, currently seems to be in sinus rhythm     Procedures: No procedures performed        PMFS History: Patient Active Problem List  Diagnosis Date Noted  . Morbid obesity with BMI of 50.0-59.9, adult (HCC) 11/25/2020  . Acute hypoxemic respiratory failure due to COVID-19 (HCC) 07/26/2020  . Hyperlipidemia 07/26/2020  . Hyperglycemia 07/26/2020  . Anxiety 02/17/2011  . Seasonal allergies 02/17/2011   Past Medical History:  Diagnosis Date  . Allergy   . Anxiety   . Atrial fibrillation (HCC)   . Heart murmur    ARRHYTHMIA  . Hyperlipidemia     Family History  Problem Relation Age of Onset  . Hyperlipidemia Mother   . Transient ischemic attack Mother   . Hypertension Mother   . Diabetes Mother   . Transient ischemic attack Father   . Ovarian cancer Paternal Grandmother   . Hyperlipidemia Maternal Grandfather   . Heart disease Maternal  Grandfather   . Hyperlipidemia Maternal Grandmother   . Heart disease Maternal Grandmother   . Stroke Paternal Grandfather   . Hypertension Paternal Grandfather   . Diabetes Paternal Grandfather   . Sudden death Maternal Uncle     Past Surgical History:  Procedure Laterality Date  . HYSTEROSCOPY W/ ENDOMETRIAL ABLATION  2011   Social History   Occupational History  . Occupation: Best boy  Tobacco Use  . Smoking status: Former Smoker    Years: 20.00    Types: Cigarettes    Quit date: 09/14/1997    Years since quitting: 23.2  . Smokeless tobacco: Never Used  Substance and Sexual Activity  . Alcohol use: No  . Drug use: No  . Sexual activity: Yes    Partners: Male

## 2020-11-26 ENCOUNTER — Telehealth: Payer: Self-pay | Admitting: Family Medicine

## 2020-11-26 LAB — CBC WITH DIFFERENTIAL/PLATELET
Absolute Monocytes: 790 cells/uL (ref 200–950)
Basophils Absolute: 42 cells/uL (ref 0–200)
Basophils Relative: 0.4 %
Eosinophils Absolute: 135 cells/uL (ref 15–500)
Eosinophils Relative: 1.3 %
HCT: 42.1 % (ref 35.0–45.0)
Hemoglobin: 13.7 g/dL (ref 11.7–15.5)
Lymphs Abs: 3526 cells/uL (ref 850–3900)
MCH: 27.2 pg (ref 27.0–33.0)
MCHC: 32.5 g/dL (ref 32.0–36.0)
MCV: 83.7 fL (ref 80.0–100.0)
MPV: 11.6 fL (ref 7.5–12.5)
Monocytes Relative: 7.6 %
Neutro Abs: 5907 cells/uL (ref 1500–7800)
Neutrophils Relative %: 56.8 %
Platelets: 328 10*3/uL (ref 140–400)
RBC: 5.03 10*6/uL (ref 3.80–5.10)
RDW: 15.1 % — ABNORMAL HIGH (ref 11.0–15.0)
Total Lymphocyte: 33.9 %
WBC: 10.4 10*3/uL (ref 3.8–10.8)

## 2020-11-26 LAB — HEMOGLOBIN A1C
Hgb A1c MFr Bld: 6.4 % of total Hgb — ABNORMAL HIGH (ref <5.7)
Mean Plasma Glucose: 137 mg/dL
eAG (mmol/L): 7.6 mmol/L

## 2020-11-26 LAB — COMPREHENSIVE METABOLIC PANEL
AG Ratio: 1.3 (calc) (ref 1.0–2.5)
ALT: 25 U/L (ref 6–29)
AST: 22 U/L (ref 10–35)
Albumin: 4.3 g/dL (ref 3.6–5.1)
Alkaline phosphatase (APISO): 63 U/L (ref 37–153)
BUN: 19 mg/dL (ref 7–25)
CO2: 26 mmol/L (ref 20–32)
Calcium: 9.7 mg/dL (ref 8.6–10.4)
Chloride: 104 mmol/L (ref 98–110)
Creat: 0.91 mg/dL (ref 0.50–1.05)
Globulin: 3.3 g/dL (calc) (ref 1.9–3.7)
Glucose, Bld: 93 mg/dL (ref 65–99)
Potassium: 4.5 mmol/L (ref 3.5–5.3)
Sodium: 141 mmol/L (ref 135–146)
Total Bilirubin: 0.3 mg/dL (ref 0.2–1.2)
Total Protein: 7.6 g/dL (ref 6.1–8.1)

## 2020-11-26 LAB — THYROID PANEL WITH TSH
Free Thyroxine Index: 2.3 (ref 1.4–3.8)
T3 Uptake: 27 % (ref 22–35)
T4, Total: 8.4 ug/dL (ref 5.1–11.9)
TSH: 1.61 mIU/L

## 2020-11-26 LAB — HIGH SENSITIVITY CRP: hs-CRP: >10 mg/L — ABNORMAL HIGH

## 2020-11-26 LAB — IRON,TIBC AND FERRITIN PANEL
%SAT: 17 % (calc) (ref 16–45)
Ferritin: 106 ng/mL (ref 16–232)
Iron: 52 ug/dL (ref 45–160)
TIBC: 313 mcg/dL (calc) (ref 250–450)

## 2020-11-26 LAB — SARS-COV-2 ANTIBODY(IGG)SPIKE,SEMI-QUANTITATIVE: SARS COV1 AB(IGG)SPIKE,SEMI QN: 18.44 index — ABNORMAL HIGH (ref <1.00)

## 2020-11-26 NOTE — Telephone Encounter (Signed)
Labs are notable for the following:  Covid antibodies were strongly positive, indicating immunity.  CBC is now normal.  Metabolic panel is also normal.  Hemoglobin A1c is significantly better at 6.4.  Continue with current regimen and recheck in 3 to 4 months.  C-reactive protein, inflammation marker, is elevated at greater than 10.  This could still be related to recent infection.  We should recheck in about 3 to 4 months.  Iron studies and thyroid studies are all normal.

## 2020-11-27 MED ORDER — METFORMIN HCL 500 MG PO TABS
500.0000 mg | ORAL_TABLET | Freq: Two times a day (BID) | ORAL | 6 refills | Status: DC
Start: 2020-11-27 — End: 2020-12-16

## 2020-11-27 NOTE — Addendum Note (Signed)
Addended by: Lillia Carmel on: 11/27/2020 08:45 AM   Modules accepted: Orders

## 2020-12-06 ENCOUNTER — Other Ambulatory Visit: Payer: Self-pay

## 2020-12-06 ENCOUNTER — Ambulatory Visit: Payer: 59 | Attending: Family Medicine

## 2020-12-06 DIAGNOSIS — R2689 Other abnormalities of gait and mobility: Secondary | ICD-10-CM | POA: Insufficient documentation

## 2020-12-06 DIAGNOSIS — Z9181 History of falling: Secondary | ICD-10-CM | POA: Diagnosis present

## 2020-12-06 DIAGNOSIS — G8929 Other chronic pain: Secondary | ICD-10-CM | POA: Diagnosis present

## 2020-12-06 DIAGNOSIS — M6281 Muscle weakness (generalized): Secondary | ICD-10-CM | POA: Insufficient documentation

## 2020-12-06 DIAGNOSIS — R2681 Unsteadiness on feet: Secondary | ICD-10-CM | POA: Insufficient documentation

## 2020-12-06 DIAGNOSIS — M545 Low back pain, unspecified: Secondary | ICD-10-CM | POA: Diagnosis present

## 2020-12-06 NOTE — Patient Instructions (Signed)
Access Code: [email or web address] URL: [email or web address] Date: 12/06/2020 Prepared by: Claude Manges  Exercises Sit to Stand with Counter Support - 1 x daily - 4 x weekly - 2 sets - 5 reps Seated March - 1 x daily - 4 x weekly - 2 sets - 5 reps Side Stepping with Counter Support - 1 x daily - 4 x weekly - 5 sets  Patient Education Magazine features editor During Daily Tasks

## 2020-12-06 NOTE — Therapy (Signed)
[facility] [address] [place], [place], [address] Phone: [phone]   Fax:  [phone]  Physical Therapy Evaluation  Patient Details  Name: Penny Huerta MRN: [id number] Date of Birth: [birth year] Referring Provider (PT): Hilts   Encounter Date: 12/06/2020   PT End of Session - 12/06/20 1154    Visit Number 1    Number of Visits 9    Date for PT Re-Evaluation 02/07/21    Authorization Type U[employer]    PT Start Time 0845    PT Stop Time 0930    PT Time Calculation (min) 45 min    Activity Tolerance Patient tolerated treatment well;Patient limited by lethargy;Patient limited by fatigue    Behavior During Therapy [facility] for tasks assessed/performed           Past Medical History:  Diagnosis Date  . Allergy   . Anxiety   . Atrial fibrillation (HCC)   . Heart murmur    ARRHYTHMIA  . Hyperlipidemia     Past Surgical History:  Procedure Laterality Date  . HYSTEROSCOPY W/ ENDOMETRIAL ABLATION  2011    There were no vitals filed for this visit.    Subjective Assessment - 12/06/20 0850    Subjective Had COVID end of 2021. Got a hematoma at the upper right quadrant of abdomen with no known cause - will get pain at certain times in the day no longer constant. Kept her from being able to turn over, walk, do anything. Was using a walker after discharge, needed help getting up from sitting/laying down but could do short distances. Was taking lipitor in 2005-2007 for genetically high cholesterol, and has had memory issues and low back pain, muscle pain. Post COVID has been experiencing significant fatigue. Currently walking with a cane as needed.    Pertinent History obesity, atrial fibrillation, hyperlipidemia, anxiety who had progressively worsening SOB, DM    How long can you walk comfortably? depends on the day.    Patient Stated Goals to be able to walk longer distances for shopping and community participation    Currently in  Pain? No/denies   usually good in the morning after taking advil in the morning, consistently during the week.             [facility] PT Assessment - 12/06/20 0850      Assessment   Medical Diagnosis R53.82 (ICD-10-CM) - Chronic fatigue  S30.1XXS (ICD-10-CM) - Abdominal wall hematoma, sequela  M54.50,G89.29 (ICD-10-CM) - Chronic bilateral low back pain without sciatica    Referring Provider (PT) Hilts    Hand Dominance Right    Prior Therapy in hospital      Balance Screen   Has the patient fallen in the past 6 months Yes    How many times? 1   first night got homefrom hospital   Has the patient had a decrease in activity level because of a fear of falling?  Yes    Is the patient reluctant to leave their home because of a fear of falling?  No      Home Environment   Additional Comments Townhouse. with husband. 5 stairs without HR to enter. 2 levels (showers upstairs) 14 steps with 1 HR inside.      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      Cognition   Overall Cognitive Status Impaired/Different from baseline      Sensation   Additional Comments right abdominal numbness where hematoma is - reports not feeling hot/cold or localized light touch in the area.      ROM / Strength   AROM / PROM / Strength AROM;Strength      AROM   Overall AROM Comments Lumbar ROM WFL and non painful at this time.      Strength   Strength Assessment Site Hip;Knee;Ankle    Right/Left Hip --   L hip flex 4+/5, R hip flex 3+/5   Right/Left Knee --   L knee flex 4+/5 ext 4+/5. R knee flex 4-/5, ext 4/5.   Right/Left Ankle --   B DF 4/5     Palpation   Palpation comment palpated right upper abdomen with incresed density felt in area of the  hematoma compared to the uninvolved left side. some tenderness/pain with palpation but pain was not reprpdiced with functional activities.      Ambulation/Gait   Gait velocity 0.428 m/s    Stairs Yes    Stairs Assistance 7: Independent    Stair Management Technique One rail Right;Two rails;Step to pattern   left leading up and down, side stepping to descend with poor control   Gait Comments with SPC - decreased gait speed with  excess lateral weight shifting/trunk lean.      Standardized Balance Assessment   Standardized Balance Assessment Timed Up and Go Test;Berg Balance Test      Berg Balance Test   Sit to Stand Able to stand  independently using hands    Standing Unsupported Able to stand safely 2 minutes    Sitting with Back Unsupported but Feet Supported on Floor or Stool Able to sit safely and securely 2 minutes    Stand to Sit Controls descent by using hands    Transfers Able to transfer safely, definite need of hands    Standing Unsupported with Eyes Closed Able to stand 10 seconds with supervision    Standing Unsupported with Feet Together Able to place feet together independently and stand for 1 minute with supervision    From Standing, Reach Forward with Outstretched Arm Can reach forward >12 cm safely (5")    From Standing Position, Pick up Object from Floor Able to pick up shoe, needs supervision    From Standing Position, Turn to Look Behind Over each Shoulder Looks behind one side only/other side shows less weight shift    Turn 360 Degrees Able to turn 360 degrees safely but slowly    Standing Unsupported, Alternately Place Feet on Step/Stool Able to stand independently and complete 8 steps >20 seconds    Standing Unsupported, One Foot in [employer] balance while stepping or standing    Standing on One Leg Tries to lift leg/unable to hold 3 seconds but remains standing independently    Total Score 38    Berg comment: Significant fall risk      Timed Up and Go Test    Normal TUG (seconds) 18   slow turn, with SPC                     Objective measurements completed on examination: See above findings.  PT Education - 12/06/20 0932    Education Details PT POC and initial HEP: Access Code: [email or web address]  URL: [email or web address]  Date: 12/06/2020  Prepared by: Claude MangesMonica Jayvien Rowlette    Exercises  Sit to Stand with Counter Support - 1 x daily - 4 x weekly - 2 sets - 5 reps  Seated March - 1 x daily - 4 x weekly - 2 sets - 5 reps  Side Stepping with Counter Support - 1 x daily - 4 x weekly - 5 sets    Patient Education  Catering managerUnderstanding Energy Conservation  Energy Conservation During Daily Tasks    Person(s) Educated Patient    Methods Explanation;Demonstration;Handout    Comprehension Verbalized understanding;Returned demonstration;Need further instruction            PT Short Term Goals - 12/06/20 1058      PT SHORT TERM GOAL #1   Title Independent with initial HEP    Time 2    Period Weeks    Status New    Target Date 12/20/20      PT SHORT TERM GOAL #2   Title Pt will demo good understanding of activity pacing and energy conservation techniques    Time 2    Period Weeks    Status New    Target Date 12/20/20             PT Long Term Goals - 12/06/20 1059      PT LONG TERM GOAL #1   Title Pt will be independent with advanced HEP    Time 8    Period Weeks    Status New    Target Date 02/07/21      PT LONG TERM GOAL #2   Title Pt will be able to compllete TUG in < 12 seconds with RPE < 2/10 to demo improved gait speed and stability with decr falls risk    Time 8    Period Weeks    Status New    Target Date 02/07/21      PT LONG TERM GOAL #3   Title Berg score improvement by at least 8 points to demonstrate improved falls risk    Baseline 38/56    Time 8    Period Weeks    Status New    Target Date 02/07/21      PT LONG TERM GOAL #4   Title Pt will report decreased fatigue with community  ambulation to facilitate ability to go grocery shopping.    Time 8    Period Weeks    Target Date 02/07/21      PT LONG TERM GOAL #5   Title pt will be able to negotiate stairs with reciprocal pattern, no greater than 1 HR, and with RPE < 2/10 to facilitate safety with stairs at home    Time 8    Period Weeks    Status New    Target Date 02/07/21      Additional Long Term Goals   Additional Long Term Goals Yes      PT LONG TERM GOAL #6   Title Gait speed improvement to at least 1.3 m/s to facilitate safe community ambulation    [facility]    Time 8    Period Weeks    Status New    Target Date 02/07/21                  Plan - 12/06/20 1155    Clinical Impression  Statement Pt is a 55 yo female who presents to physical therapy evaluation for concerns of chronic fatigue with decreased endurance after having and being hospitalized for COVID19 at the end of 2020. She also has a history of chronic LBP and at time of hospitalization for COVID sustained a right sided abdominal wall hematoma that she continues to feel and can be painful. She currently presents with alot of fatigue, decreased endurance, muscle weakness, deconditioning and decreased balance. As a result she has difficulty walking longer distances within the community hich has kept her from being able to participate with family/friends, in addition to limiting tolerance to shopping, prolonged activities. She will benefit from skilled PT to work on improving overall functional strength balance and mobility, decrease pain, and improve endurance. Education provided at end of evaluation with emphasis on energy conservation and activity pacing to manage fatigue and she demonstrated good understanding.    Personal Factors and Comorbidities Comorbidity 3+    Comorbidities obesity, atrial fibrillation, hyperlipidemia, anxiety, SOB, COVID 11/9, DM, L knee arthritis    Examination-Activity Limitations Locomotion  Level;Squat;Stairs    Examination-Participation Restrictions Community Activity;Interpersonal Relationship;Shop    Stability/Clinical Decision Making Evolving/Moderate complexity    Clinical Decision Making Moderate    Rehab Potential Good    PT Frequency 1x / week    PT Duration 8 weeks    PT Treatment/Interventions ADLs/Self Care Home Management;Cryotherapy;Electrical Stimulation;Moist Heat;Gait training;Neuromuscular re-education;Therapeutic exercise;Therapeutic activities;Functional mobility training;Patient/family education;Manual techniques;Energy conservation;Vestibular    PT Next Visit Plan Reassess HEP. Gradual progression of TE, balance, endurance. start low reps with therapeutic rest breaks for energy conservation. Plan to buld up tolerance to increased repetitions of exercises, progress gait training without SPC, increasing gait speed as tolerated.    PT Home Exercise Plan see pt edu    Consulted and Agree with Plan of Care Patient           Patient will benefit from skilled therapeutic intervention in order to improve the following deficits and impairments:  Abnormal gait,Decreased range of motion,Difficulty walking,Obesity,Decreased endurance,Decreased activity tolerance,Pain,Improper body mechanics,Decreased balance,Decreased mobility,Decreased strength,Postural dysfunction,Impaired flexibility  Visit Diagnosis: Other abnormalities of gait and mobility - Plan: PT plan of care cert/re-cert  Chronic low back pain, unspecified back pain laterality, unspecified whether sciatica present - Plan: PT plan of care cert/re-cert  History of falling - Plan: PT plan of care cert/re-cert  Unsteadiness on feet - Plan: PT plan of care cert/re-cert  Muscle weakness (generalized) - Plan: PT plan of care cert/re-cert     Problem List Patient Active Problem List   Diagnosis Date Noted  . Morbid obesity with BMI of 50.0-59.9, adult (HCC) 11/25/2020  . Acute hypoxemic respiratory failure  due to COVID-19 (HCC) 07/26/2020  . Hyperlipidemia 07/26/2020  . Hyperglycemia 07/26/2020  . Anxiety 02/17/2011  . Seasonal allergies 02/17/2011    Anson Crofts, PT, DPT 12/06/2020, 12:16 PM  [facility] [address] [place], [place], [address] Phone: [phone]   Fax:  [phone]  Name: Penny Huerta MRN: [id number] Date of Birth: 26-Mar-1966

## 2020-12-11 ENCOUNTER — Other Ambulatory Visit: Payer: Self-pay

## 2020-12-11 ENCOUNTER — Encounter: Payer: Self-pay | Admitting: Physical Therapy

## 2020-12-11 ENCOUNTER — Ambulatory Visit: Payer: 59 | Admitting: Physical Therapy

## 2020-12-11 DIAGNOSIS — R2681 Unsteadiness on feet: Secondary | ICD-10-CM

## 2020-12-11 DIAGNOSIS — Z9181 History of falling: Secondary | ICD-10-CM

## 2020-12-11 DIAGNOSIS — R2689 Other abnormalities of gait and mobility: Secondary | ICD-10-CM | POA: Diagnosis not present

## 2020-12-11 DIAGNOSIS — M6281 Muscle weakness (generalized): Secondary | ICD-10-CM

## 2020-12-11 DIAGNOSIS — M545 Low back pain, unspecified: Secondary | ICD-10-CM

## 2020-12-11 DIAGNOSIS — G8929 Other chronic pain: Secondary | ICD-10-CM

## 2020-12-11 NOTE — Therapy (Signed)
[facility] [address] [place], [place], [address] Phone: [phone]   Fax:  [phone]  Physical Therapy Treatment  Patient Details  Name: Penny Huerta MRN: [id number] Date of Birth: [birth year] Referring Provider (PT): Hilts   Encounter Date: 12/11/2020   PT End of Session - 12/11/20 1011    Visit Number 2    Number of Visits 9    Date for PT Re-Evaluation 02/07/21    PT Start Time 0931    PT Stop Time 1013    PT Time Calculation (min) 42 min    Activity Tolerance Patient tolerated treatment well    Behavior During Therapy [facility] for tasks assessed/performed           Past Medical History:  Diagnosis Date  . Allergy   . Anxiety   . Atrial fibrillation (HCC)   . Heart murmur    ARRHYTHMIA  . Hyperlipidemia     Past Surgical History:  Procedure Laterality Date  . HYSTEROSCOPY W/ ENDOMETRIAL ABLATION  2011    There were no vitals filed for this visit.   Subjective Assessment - 12/11/20 0933    Subjective "I feel stronger" "Legs feel like jelly cause I have been doing my exercises"    Pertinent History obesity, atrial fibrillation, hyperlipidemia, anxiety who had progressively worsening SOB, DM    Currently in Pain? No/denies                             [facility] Adult PT Treatment/Exercise - 12/11/20 0001      Ambulation/Gait   Ambulation/Gait Yes    Ambulation/Gait Assistance 5: Supervision    Ambulation Distance (Feet) 450 Feet    Assistive device None    Gait Pattern Step-through pattern      Exercises   Exercises Lumbar;Knee/Hip      Lumbar Exercises: Aerobic   Recumbent Bike L1.7 x 5 min      Lumbar Exercises: Standing   Row Theraband;20 reps;Both;Strengthening    Theraband Level (Row) Level 3 (Green)    Shoulder Extension Theraband;20 reps;Strengthening;Both    Theraband Level (Shoulder Extension) Level 3 (Green)      Lumbar Exercises: Seated   Long Arc Quad on Chair 2  sets;10 reps    LAQ on Chair Limitations 3lb    Sit to Stand 5 reps   x2     Knee/Hip Exercises: Seated   Marching 10 reps;2 sets;Both;Strengthening    Marching Weights 3 lbs.    Hamstring Curl 10 reps;2 sets;Strengthening;Both    Hamstring Limitations green Tband                    PT Short Term Goals - 12/06/20 1058      PT SHORT TERM GOAL #1   Title Independent with initial HEP    Time 2    Period Weeks    Status New    Target Date 12/20/20      PT SHORT TERM GOAL #2   Title Pt will demo good understanding of activity pacing and energy conservation techniques    Time 2    Period Weeks    Status New    Target Date 12/20/20             PT Long Term Goals - 12/06/20 1059      PT LONG TERM GOAL #1   Title Pt will be independent with advanced HEP  Time 8    Period Weeks    Status New    Target Date 02/07/21      PT LONG TERM GOAL #2   Title Pt will be able to compllete TUG in < 12 seconds with RPE < 2/10 to demo improved gait speed and stability with decr falls risk    Time 8    Period Weeks    Status New    Target Date 02/07/21      PT LONG TERM GOAL #3   Title Berg score improvement by at least 8 points to demonstrate improved falls risk    Baseline 38/56    Time 8    Period Weeks    Status New    Target Date 02/07/21      PT LONG TERM GOAL #4   Title Pt will report decreased fatigue with community ambulation to facilitate ability to go grocery shopping.    Time 8    Period Weeks    Target Date 02/07/21      PT LONG TERM GOAL #5   Title pt will be able to negotiate stairs with reciprocal pattern, no greater than 1 HR, and with RPE < 2/10 to facilitate safety with stairs at home    Time 8    Period Weeks    Status New    Target Date 02/07/21      Additional Long Term Goals   Additional Long Term Goals Yes      PT LONG TERM GOAL #6   Title Gait speed improvement to at least 1.3 m/s to facilitate safe community ambulation    Baseline  0.428 m/s with [facility]    Time 8    Period Weeks    Status New    Target Date 02/07/21                 Plan - 12/11/20 1012    Clinical Impression Statement Pt tolerated an initial progression to TE well evident by no reports of pain. Pt did have signs of fatigue throughout. Despite fatigue pt was wanting to keep working not allowing rest. Advised pt is was best for her to rest to allow her body to recover and she was compliant. Some postural weakness noted with rows. Some difficulty noted with 6 in step ups.    Comorbidities obesity, atrial fibrillation, hyperlipidemia, anxiety, SOB, COVID 11/9, DM, L knee arthritis    Examination-Activity Limitations Locomotion Level;Squat;Stairs    Examination-Participation Restrictions Community Activity;Interpersonal Relationship;Shop    Stability/Clinical Decision Making Evolving/Moderate complexity    Rehab Potential Good    PT Frequency 1x / week    PT Duration 8 weeks    PT Treatment/Interventions ADLs/Self Care Home Management;Cryotherapy;Electrical Stimulation;Moist Heat;Gait training;Neuromuscular re-education;Therapeutic exercise;Therapeutic activities;Functional mobility training;Patient/family education;Manual techniques;Energy conservation;Vestibular    PT Next Visit Plan Reassess HEP. Gradual progression of TE, balance, endurance. start low reps with therapeutic rest breaks for energy conservation. Plan to buld up tolerance to increased repetitions of exercises, progress gait training without SPC, increasing gait speed as tolerated.           Patient will benefit from skilled therapeutic intervention in order to improve the following deficits and impairments:  Abnormal gait,Decreased range of motion,Difficulty walking,Obesity,Decreased endurance,Decreased activity tolerance,Pain,Improper body mechanics,Decreased balance,Decreased mobility,Decreased strength,Postural dysfunction,Impaired flexibility  Visit Diagnosis: Chronic low back  pain, unspecified back pain laterality, unspecified whether sciatica present  History of falling  Unsteadiness on feet  Other abnormalities of gait and mobility  Muscle weakness (  generalized)     Problem List Patient Active Problem List   Diagnosis Date Noted  . Morbid obesity with BMI of 50.0-59.9, adult (HCC) 11/25/2020  . Acute hypoxemic respiratory failure due to COVID-19 (HCC) 07/26/2020  . Hyperlipidemia 07/26/2020  . Hyperglycemia 07/26/2020  . Anxiety 02/17/2011  . Seasonal allergies 02/17/2011    Grayce Sessions 12/11/2020, 10:19 AM  [facility] [address] [place], [place], [address] Phone: [phone]   Fax:  [phone]  Name: Penny Huerta MRN: [id number] Date of Birth: Mar 09, 1966

## 2020-12-16 ENCOUNTER — Other Ambulatory Visit: Payer: Self-pay | Admitting: Family Medicine

## 2020-12-16 MED ORDER — METFORMIN HCL 500 MG PO TABS
500.0000 mg | ORAL_TABLET | Freq: Two times a day (BID) | ORAL | 3 refills | Status: DC
Start: 2020-12-16 — End: 2021-12-02

## 2020-12-18 ENCOUNTER — Other Ambulatory Visit: Payer: Self-pay

## 2020-12-18 ENCOUNTER — Ambulatory Visit: Payer: 59 | Attending: Family Medicine

## 2020-12-18 DIAGNOSIS — R2689 Other abnormalities of gait and mobility: Secondary | ICD-10-CM | POA: Insufficient documentation

## 2020-12-18 DIAGNOSIS — M545 Low back pain, unspecified: Secondary | ICD-10-CM | POA: Diagnosis not present

## 2020-12-18 DIAGNOSIS — Z9181 History of falling: Secondary | ICD-10-CM | POA: Diagnosis present

## 2020-12-18 DIAGNOSIS — M6281 Muscle weakness (generalized): Secondary | ICD-10-CM | POA: Insufficient documentation

## 2020-12-18 DIAGNOSIS — R2681 Unsteadiness on feet: Secondary | ICD-10-CM | POA: Diagnosis present

## 2020-12-18 DIAGNOSIS — G8929 Other chronic pain: Secondary | ICD-10-CM | POA: Insufficient documentation

## 2020-12-18 NOTE — Therapy (Signed)
[facility] [address] [place], [place], [address] Phone: [phone]   Fax:  [phone]  Physical Therapy Treatment  Patient Details  Name: MERLY HINKSON MRN: [id number] Date of Birth: Jun 08, 1966 Referring Provider (PT): Hilts   Encounter Date: 12/18/2020   PT End of Session - 12/18/20 0940    Visit Number 2    Number of Visits 9    Date for PT Re-Evaluation 02/07/21    Authorization Type UHC    PT Start Time 0934    PT Stop Time 1015    PT Time Calculation (min) 41 min    Activity Tolerance Patient tolerated treatment well    Behavior During Therapy [facility] for tasks assessed/performed           Past Medical History:  Diagnosis Date  . Allergy   . Anxiety   . Atrial fibrillation (HCC)   . Heart murmur    ARRHYTHMIA  . Hyperlipidemia     Past Surgical History:  Procedure Laterality Date  . HYSTEROSCOPY W/ ENDOMETRIAL ABLATION  2011    There were no vitals filed for this visit.   Subjective Assessment - 12/18/20 0937    Subjective BP was low last night and this morning. Feels like she felt stronger after last session, no increase in fatigue    Pertinent History obesity, atrial fibrillation, hyperlipidemia, anxiety who had progressively worsening SOB, DM    Patient Stated Goals to be able to walk longer distances for shopping and community participation    Currently in Pain? No/denies             [facility] Adult PT Treatment/Exercise - 12/18/20 0001      Ambulation/Gait   Ambulation/Gait Yes    Ambulation/Gait Assistance 5: Supervision    Ambulation Distance (Feet) 450 Feet    Assistive device None    Gait Pattern Step-through pattern   4-5/10 fatigue, HR 110 upon sitting down     Lumbar Exercises: Standing   Row Theraband;20 reps;Both;Strengthening    Theraband Level (Row) Level 3 (Green)    Shoulder Extension Theraband;20 reps;Strengthening;Both    Theraband Level (Shoulder Extension) Level 3 (Green)       Lumbar Exercises: Seated   Long Arc Quad on Chair 2 sets;10 reps    LAQ on Chair Limitations 3lb    Sit to Stand 5 reps    Sit to Stand Limitations 2 sets    Other Seated Lumbar Exercises Pursed lip breathing in sitting 5 x 2 emphasis on slow controlled inhale and elongated exhale through pursed lips. PLB 5 x 2 in sitting on mat table for recovery post long distance walk in clinic.    Other Seated Lumbar Exercises Seated marches 2 x 5 alternating             PT Short Term Goals - 12/06/20 1058      PT SHORT TERM GOAL #1   Title Independent with initial HEP    Time 2    Period Weeks    Status New    Target Date 12/20/20      PT SHORT TERM GOAL #2   Title Pt will demo good understanding of activity pacing and energy conservation techniques    Time 2    Period Weeks    Status New    Target Date 12/20/20             PT Long Term Goals - 12/06/20 1059  PT LONG TERM GOAL #1   Title Pt will be independent with advanced HEP    Time 8    Period Weeks    Status New    Target Date 02/07/21      PT LONG TERM GOAL #2   Title Pt will be able to compllete TUG in < 12 seconds with RPE < 2/10 to demo improved gait speed and stability with decr falls risk    Time 8    Period Weeks    Status New    Target Date 02/07/21      PT LONG TERM GOAL #3   Title Berg score improvement by at least 8 points to demonstrate improved falls risk    Baseline 38/56    Time 8    Period Weeks    Status New    Target Date 02/07/21      PT LONG TERM GOAL #4   Title Pt will report decreased fatigue with community ambulation to facilitate ability to go grocery shopping.    Time 8    Period Weeks    Target Date 02/07/21      PT LONG TERM GOAL #5   Title pt will be able to negotiate stairs with reciprocal pattern, no greater than 1 HR, and with RPE < 2/10 to facilitate safety with stairs at home    Time 8    Period Weeks    Status New    Target Date 02/07/21      Additional Long  Term Goals   Additional Long Term Goals Yes      PT LONG TERM GOAL #6   Title Gait speed improvement to at least 1.3 m/s to facilitate safe community ambulation    Baseline 0.428 m/s with SPC    Time 8    Period Weeks    Status New    Target Date 02/07/21                 Plan - 12/18/20 0941    Clinical Impression Statement BP assessed start of session and instead of being low it was higher than expected: 165/105 HR 92 O2 92 start of session with walk from lobby to gym.. 150/100 HR 84 after 3 minute rest. Initiated gentle seated PLB with good tolerance and improved BP to 135/96. Able to otherwise complete exercises with therapeuti rest breaks in between. End of session BP 142/94 after doing PLB post walking.    Personal Factors and Comorbidities Comorbidity 3+    Comorbidities obesity, atrial fibrillation, hyperlipidemia, anxiety, SOB, COVID 11/9, DM, L knee arthritis    Examination-Activity Limitations Locomotion Level;Squat;Stairs    Examination-Participation Restrictions Community Activity;Interpersonal Relationship;Shop    Rehab Potential Good    PT Frequency 1x / week    PT Duration 8 weeks    PT Treatment/Interventions ADLs/Self Care Home Management;Cryotherapy;Electrical Stimulation;Moist Heat;Gait training;Neuromuscular re-education;Therapeutic exercise;Therapeutic activities;Functional mobility training;Patient/family education;Manual techniques;Energy conservation;Vestibular    PT Next Visit Plan Reassess HEP. Gradual progression of TE, balance, endurance. start low reps with therapeutic rest breaks for energy conservation. Plan to buld up tolerance to increased repetitions of exercises, progress gait training without SPC, increasing gait speed as tolerated.    PT Home Exercise Plan see pt edu    Consulted and Agree with Plan of Care Patient           Patient will benefit from skilled therapeutic intervention in order to improve the following deficits and impairments:   Abnormal gait,Decreased range of motion,Difficulty  walking,Obesity,Decreased endurance,Decreased activity tolerance,Pain,Improper body mechanics,Decreased balance,Decreased mobility,Decreased strength,Postural dysfunction,Impaired flexibility  Visit Diagnosis: Chronic low back pain, unspecified back pain laterality, unspecified whether sciatica present  History of falling  Unsteadiness on feet  Other abnormalities of gait and mobility  Muscle weakness (generalized)     Problem List Patient Active Problem List   Diagnosis Date Noted  . Morbid obesity with BMI of 50.0-59.9, adult (HCC) 11/25/2020  . Acute hypoxemic respiratory failure due to COVID-19 (HCC) 07/26/2020  . Hyperlipidemia 07/26/2020  . Hyperglycemia 07/26/2020  . Anxiety 02/17/2011  . Seasonal allergies 02/17/2011    Anson Crofts, PT, DPT 12/18/2020, 10:16 AM  [facility] [address] [place], [place], [address] Phone: [phone]   Fax:  [phone]  Name: MAKAI AGOSTINELLI MRN: [id number] Date of Birth: 1966-08-27

## 2020-12-25 ENCOUNTER — Other Ambulatory Visit: Payer: Self-pay

## 2020-12-25 ENCOUNTER — Encounter: Payer: Self-pay | Admitting: Physical Therapy

## 2020-12-25 ENCOUNTER — Ambulatory Visit: Payer: 59 | Admitting: Physical Therapy

## 2020-12-25 DIAGNOSIS — M545 Low back pain, unspecified: Secondary | ICD-10-CM | POA: Diagnosis not present

## 2020-12-25 DIAGNOSIS — Z9181 History of falling: Secondary | ICD-10-CM

## 2020-12-25 DIAGNOSIS — R2689 Other abnormalities of gait and mobility: Secondary | ICD-10-CM

## 2020-12-25 DIAGNOSIS — G8929 Other chronic pain: Secondary | ICD-10-CM

## 2020-12-25 DIAGNOSIS — R2681 Unsteadiness on feet: Secondary | ICD-10-CM

## 2020-12-25 DIAGNOSIS — M6281 Muscle weakness (generalized): Secondary | ICD-10-CM

## 2020-12-25 NOTE — Patient Instructions (Signed)
Access Code: [email or web address] URL: [email or web address] Date: 12/25/2020 Prepared by: Debroah Baller  Exercises Runner's Step Up/Down - 1 x daily - 7 x weekly - 3 sets - 10 reps Standing Marching - 1 x daily - 7 x weekly - 3 sets - 10 reps

## 2020-12-25 NOTE — Therapy (Signed)
[facility]. [place], [place], [address] Phone: [phone]   Fax:  [phone]  Physical Therapy Treatment  Patient Details  Name: Penny Huerta MRN: [id number] Date of Birth: Mar 17, 1966 Referring Provider (PT): Hilts   Encounter Date: 12/25/2020   PT End of Session - 12/25/20 1011    Visit Number 3    Date for PT Re-Evaluation 02/07/21    Authorization Type UHC    PT Start Time 0930    PT Stop Time 2706    PT Time Calculation (min) 44 min           Past Medical History:  Diagnosis Date  . Allergy   . Anxiety   . Atrial fibrillation ([place])   . Heart murmur    ARRHYTHMIA  . Hyperlipidemia     Past Surgical History:  Procedure Laterality Date  . HYSTEROSCOPY W/ ENDOMETRIAL ABLATION  2011    There were no vitals filed for this visit.   Subjective Assessment - 12/25/20 0931    Subjective Doing well    Currently in Pain? No/denies                             [facility] Adult PT Treatment/Exercise - 12/25/20 0001      Ambulation/Gait   Gait Comments Gait outisde up and dow slope~ 233f, some fatigue with up hill ambulation      Lumbar Exercises: Aerobic   Recumbent Bike L2.5 x 3 min    Nustep L5 x 642m      Lumbar Exercises: Standing   Shoulder Extension 20 reps;Both;Power Tower    Shoulder Extension Limitations 10      Knee/Hip Exercises: Machines for Strengthening   Cybex Leg Press 30lb 3x15      Knee/Hip Exercises: Standing   Other Standing Knee Exercises Alt 6'' runners step ups 3x10      Knee/Hip Exercises: Seated   Sit to Sand 2 sets;10 reps;without UE support   OHP w/ yellow ball                   PT Short Term Goals - 12/25/20 1014      PT SHORT TERM GOAL #1   Title Independent with initial HEP    Status Achieved      PT SHORT TERM GOAL #2   Title Pt will demo good understanding of activity pacing and energy conservation techniques    Status  Partially Met             PT Long Term Goals - 12/25/20 1014      PT LONG TERM GOAL #1   Title Pt will be independent with advanced HEP    Status On-going      PT LONG TERM GOAL #2   Title Pt will be able to compllete TUG in < 12 seconds with RPE < 2/10 to demo improved gait speed and stability with decr falls risk    Status On-going                 Plan - 12/25/20 1012    Clinical Impression Statement Pt given updated HEP. Pt able to complete all interventions but with increase fatigue. Progressed to leg press with low resistance but high volume. Uphill ambulation did cause some increase fatigue as well as some LE tightness. HHX x1 needed negotiating curbs outside due to fatigue. Increase rest given between interventions.  Comorbidities obesity, atrial fibrillation, hyperlipidemia, anxiety, SOB, COVID 11/9, DM, L knee arthritis    Examination-Activity Limitations Locomotion Level;Squat;Stairs    Examination-Participation Restrictions Community Activity;Interpersonal Relationship;Shop    Stability/Clinical Decision Making Evolving/Moderate complexity    Rehab Potential Good    PT Frequency 1x / week    PT Duration 8 weeks    PT Treatment/Interventions ADLs/Self Care Home Management;Cryotherapy;Electrical Stimulation;Moist Heat;Gait training;Neuromuscular re-education;Therapeutic exercise;Therapeutic activities;Functional mobility training;Patient/family education;Manual techniques;Energy conservation;Vestibular    PT Next Visit Plan Gradual progression of TE, balance, endurance.  Plan to buld up tolerance to increased repetitions of exercises, progress gait training without SPC, increasing gait speed as tolerated.           Patient will benefit from skilled therapeutic intervention in order to improve the following deficits and impairments:  Abnormal gait,Decreased range of motion,Difficulty walking,Obesity,Decreased endurance,Decreased activity tolerance,Pain,Improper  body mechanics,Decreased balance,Decreased mobility,Decreased strength,Postural dysfunction,Impaired flexibility  Visit Diagnosis: Chronic low back pain, unspecified back pain laterality, unspecified whether sciatica present  History of falling  Unsteadiness on feet  Other abnormalities of gait and mobility  Muscle weakness (generalized)     Problem List Patient Active Problem List   Diagnosis Date Noted  . Morbid obesity with BMI of 50.0-59.9, adult ([place]) 11/25/2020  . Acute hypoxemic respiratory failure due to COVID-19 ([place]) 07/26/2020  . Hyperlipidemia 07/26/2020  . Hyperglycemia 07/26/2020  . Anxiety 02/17/2011  . Seasonal allergies 02/17/2011    Scot Jun, PTA 12/25/2020, 10:15 AM  [facility]. [place], [place], [address] Phone: [phone]   Fax:  [phone]  Name: Penny Huerta MRN: [id number] Date of Birth: [birth year]

## 2021-01-01 ENCOUNTER — Other Ambulatory Visit: Payer: Self-pay

## 2021-01-01 ENCOUNTER — Ambulatory Visit: Payer: 59 | Admitting: Physical Therapy

## 2021-01-01 DIAGNOSIS — G8929 Other chronic pain: Secondary | ICD-10-CM

## 2021-01-01 DIAGNOSIS — R2689 Other abnormalities of gait and mobility: Secondary | ICD-10-CM

## 2021-01-01 DIAGNOSIS — M545 Low back pain, unspecified: Secondary | ICD-10-CM | POA: Diagnosis not present

## 2021-01-01 DIAGNOSIS — R2681 Unsteadiness on feet: Secondary | ICD-10-CM

## 2021-01-01 DIAGNOSIS — M6281 Muscle weakness (generalized): Secondary | ICD-10-CM

## 2021-01-01 DIAGNOSIS — Z9181 History of falling: Secondary | ICD-10-CM

## 2021-01-01 NOTE — Therapy (Signed)
[facility] [place]. [place], [place], [address] Phone: [phone]   Fax:  [phone]  Physical Therapy Treatment  Patient Details  Name: Penny Huerta MRN: [id number] Date of Birth: 21-Mar-1966 Referring Provider (PT): Hilts   Encounter Date: 01/01/2021   PT End of Session - 01/01/21 0852    Visit Number 4    Number of Visits 9    Date for PT Re-Evaluation 02/07/21    PT Start Time 0850    PT Stop Time 0928    PT Time Calculation (min) 38 min    Activity Tolerance Patient tolerated treatment well    Behavior During Therapy [facility] for tasks assessed/performed           Past Medical History:  Diagnosis Date  . Allergy   . Anxiety   . Atrial fibrillation ([place])   . Heart murmur    ARRHYTHMIA  . Hyperlipidemia     Past Surgical History:  Procedure Laterality Date  . HYSTEROSCOPY W/ ENDOMETRIAL ABLATION  2011    There were no vitals filed for this visit.   Subjective Assessment - 01/01/21 0851    Subjective Feeling pretty good.    Currently in Pain? No/denies                             [facility] Adult PT Treatment/Exercise - 01/01/21 0001      Ambulation/Gait   Gait Comments Gait outisde up and down slope~ 365f, some fatigue last 510fand over curb      Lumbar Exercises: Aerobic   Recumbent Bike L2 x 47m67m      Lumbar Exercises: Standing   Row Theraband;Both;Strengthening;10 reps    Theraband Level (Row) Level 3 (Green)    Shoulder Extension Theraband;Strengthening;Both;10 reps    Theraband Level (Shoulder Extension) Level 3 (Green)      Knee/Hip Exercises: Standing   Other Standing Knee Exercises Alt 6'' runners step ups 2x20      Knee/Hip Exercises: Seated   Long Arc Quad 2 sets;10 reps    Long Arc Quad Weight 3 lbs.    Marching 10 reps;2 sets;Both;Strengthening    Marching Weights 3 lbs.    Sit to Sand 2 sets;10 reps;without UE support   CP+OHP with yellow ball                    PT Short Term Goals - 12/25/20 1014      PT SHORT TERM GOAL #1   Title Independent with initial HEP    Status Achieved      PT SHORT TERM GOAL #2   Title Pt will demo good understanding of activity pacing and energy conservation techniques    Status Partially Met             PT Long Term Goals - 01/01/21 0930      PT LONG TERM GOAL #1   Title Pt will be independent with advanced HEP    Status On-going      PT LONG TERM GOAL #2   Title Pt will be able to compllete TUG in < 12 seconds with RPE < 2/10 to demo improved gait speed and stability with decr falls risk    Status On-going      PT LONG TERM GOAL #4   Title Pt will report decreased fatigue with community ambulation to facilitate ability to go grocery shopping.  Status On-going                 Plan - 01/01/21 0315    Clinical Impression Statement Pt tolerated session well today with less seated rest needed between TE. Pt was able to ambulate an increased distance outside with less fatigue uphill and no HH needed for curb negotionation.    PT Treatment/Interventions ADLs/Self Care Home Management;Cryotherapy;Electrical Stimulation;Moist Heat;Gait training;Neuromuscular re-education;Therapeutic exercise;Therapeutic activities;Functional mobility training;Patient/family education;Manual techniques;Energy conservation;Vestibular    PT Next Visit Plan continue gradual prgression of TE, balance, endurance. Continue to build tolerance to increased repetitions of exercise and increased gait speed as tolerated.           Patient will benefit from skilled therapeutic intervention in order to improve the following deficits and impairments:  Abnormal gait,Decreased range of motion,Difficulty walking,Obesity,Decreased endurance,Decreased activity tolerance,Pain,Improper body mechanics,Decreased balance,Decreased mobility,Decreased strength,Postural dysfunction,Impaired flexibility  Visit  Diagnosis: Chronic low back pain, unspecified back pain laterality, unspecified whether sciatica present  History of falling  Unsteadiness on feet  Other abnormalities of gait and mobility  Muscle weakness (generalized)     Problem List Patient Active Problem List   Diagnosis Date Noted  . Morbid obesity with BMI of 50.0-59.9, adult ([place]) 11/25/2020  . Acute hypoxemic respiratory failure due to COVID-19 ([place]) 07/26/2020  . Hyperlipidemia 07/26/2020  . Hyperglycemia 07/26/2020  . Anxiety 02/17/2011  . Seasonal allergies 02/17/2011    Ernst Spell, SPTA 01/01/2021, 9:31 AM  [place]. [place], [place], [address] Phone: [phone]   Fax:  [phone]  Name: Penny Huerta MRN: [id number] Date of Birth: December 18, 1965

## 2021-01-07 ENCOUNTER — Encounter: Payer: Self-pay | Admitting: Family Medicine

## 2021-01-07 ENCOUNTER — Telehealth: Payer: Self-pay | Admitting: Family Medicine

## 2021-01-07 NOTE — Telephone Encounter (Signed)
Please advise 

## 2021-01-07 NOTE — Telephone Encounter (Signed)
Patient called asked if Dr Prince Rome will write a letter excusing her from jury duty 01/15/2021. Patient said she was just diag. as a diabetic and will not have access to her purse,phone or medicine.  The number to contact patient is [phone]

## 2021-01-07 NOTE — Telephone Encounter (Signed)
Printed

## 2021-01-08 ENCOUNTER — Other Ambulatory Visit: Payer: Self-pay

## 2021-01-08 ENCOUNTER — Ambulatory Visit: Payer: 59 | Admitting: Physical Therapy

## 2021-01-08 DIAGNOSIS — M6281 Muscle weakness (generalized): Secondary | ICD-10-CM

## 2021-01-08 DIAGNOSIS — M545 Low back pain, unspecified: Secondary | ICD-10-CM

## 2021-01-08 DIAGNOSIS — Z9181 History of falling: Secondary | ICD-10-CM

## 2021-01-08 DIAGNOSIS — R2681 Unsteadiness on feet: Secondary | ICD-10-CM

## 2021-01-08 DIAGNOSIS — R2689 Other abnormalities of gait and mobility: Secondary | ICD-10-CM

## 2021-01-08 NOTE — Therapy (Signed)
[place], [place], [address] Phone: [phone]   Fax:  [phone]  Physical Therapy Treatment  Patient Details  Name: Penny [place] MRN: [id number] Date of Birth: 03-17-1966 Referring Provider (PT): Hilts   Encounter Date: 01/08/2021   PT End of Session - 01/08/21 0935    Visit Number 5    Number of Visits 9    Date for PT Re-Evaluation 02/07/21    PT Start Time 0930    PT Stop Time 1008    PT Time Calculation (min) 38 min    Activity Tolerance Patient tolerated treatment well    Behavior During Therapy [facility] for tasks assessed/performed           Past Medical History:  Diagnosis Date  . Allergy   . Anxiety   . Atrial fibrillation ([place])   . Heart murmur    ARRHYTHMIA  . Hyperlipidemia     Past Surgical History:  Procedure Laterality Date  . HYSTEROSCOPY W/ ENDOMETRIAL ABLATION  2011    There were no vitals filed for this visit.   Subjective Assessment - 01/08/21 0934    Subjective Pt reports that she is feeling more fatigued than normal this week and blood sugar has been higher than normal.    Currently in Pain? No/denies                             [facility] Adult PT Treatment/Exercise - 01/08/21 0001      Ambulation/Gait   Gait Comments Gait outisde up and down slope~ 444f, some fatigue last 534fand over curb      Lumbar Exercises: Aerobic   Recumbent Bike L2 x 74m674m      Lumbar Exercises: Standing   Other Standing Lumbar Exercises Shoulder Ext/ Row 10# 2x10 each      Knee/Hip Exercises: Machines for Strengthening   Cybex Knee Extension 10# 2x10    Cybex Knee Flexion 20# 2x10      Knee/Hip Exercises: Standing   Other Standing Knee Exercises 3# ankle weights marching x20      Knee/Hip Exercises: Seated   Sit to Sand 2 sets;10 reps;without UE support   on airex                   PT Short Term Goals - 12/25/20 1014      PT SHORT TERM GOAL #1    Title Independent with initial HEP    Status Achieved      PT SHORT TERM GOAL #2   Title Pt will demo good understanding of activity pacing and energy conservation techniques    Status Partially Met             PT Long Term Goals - 01/08/21 1011      PT LONG TERM GOAL #1   Title Pt will be independent with advanced HEP    Status On-going      PT LONG TERM GOAL #2   Title Pt will be able to compllete TUG in < 12 seconds with RPE < 2/10 to demo improved gait speed and stability with decr falls risk    Status On-going      PT LONG TERM GOAL #3   Title Berg score improvement by at least 8 points to demonstrate improved falls risk    Status On-going      PT LONG TERM GOAL #4  Title Pt will report decreased fatigue with community ambulation to facilitate ability to go grocery shopping.    Status Partially Met      PT LONG TERM GOAL #5   Title pt will be able to negotiate stairs with reciprocal pattern, no greater than 1 HR, and with RPE < 2/10 to facilitate safety with stairs at home    Status On-going      PT LONG TERM GOAL #6   Title Gait speed improvement to at least 1.3 m/s to facilitate safe community ambulation    Status On-going                 Plan - 01/08/21 1008    Clinical Impression Statement Pt tolerated treatment well today with progression of strength and endurance. Outdoor amb progressed with increased distance and less fatigue.    PT Treatment/Interventions ADLs/Self Care Home Management;Cryotherapy;Electrical Stimulation;Moist Heat;Gait training;Neuromuscular re-education;Therapeutic exercise;Therapeutic activities;Functional mobility training;Patient/family education;Manual techniques;Energy conservation;Vestibular    PT Next Visit Plan continue gradual prgression of TE, balance, endurance. Continue to build tolerance to increased repetitions of exercise and increased gait speed as tolerated.           Patient will benefit from skilled  therapeutic intervention in order to improve the following deficits and impairments:  Abnormal gait,Decreased range of motion,Difficulty walking,Obesity,Decreased endurance,Decreased activity tolerance,Pain,Improper body mechanics,Decreased balance,Decreased mobility,Decreased strength,Postural dysfunction,Impaired flexibility  Visit Diagnosis: Chronic low back pain, unspecified back pain laterality, unspecified whether sciatica present  History of falling  Unsteadiness on feet  Other abnormalities of gait and mobility  Muscle weakness (generalized)     Problem List Patient Active Problem List   Diagnosis Date Noted  . Morbid obesity with BMI of 50.0-59.9, adult ([place]) 11/25/2020  . Acute hypoxemic respiratory failure due to COVID-19 ([place]) 07/26/2020  . Hyperlipidemia 07/26/2020  . Hyperglycemia 07/26/2020  . Anxiety 02/17/2011  . Seasonal allergies 02/17/2011    Ernst Spell, SPTA 01/08/2021, 10:12 AM  [place] [place], [place], [address] Phone: [phone]   Fax:  [phone]  Name: Penny [place] MRN: [id number] Date of Birth: 19-Nov-1965

## 2021-01-08 NOTE — Telephone Encounter (Signed)
I called the patient to see how she would like me to get the signed letter to her- she can see the unsigned copy in the chart. The patient stated she has decided to try to serve after all. She had gotten worried yesterday because her CBG was "doubled." She was afraid this might happen in the courtroom. Again, she stated she would like to try.

## 2021-01-22 ENCOUNTER — Other Ambulatory Visit: Payer: Self-pay

## 2021-01-22 ENCOUNTER — Ambulatory Visit: Payer: 59 | Attending: Family Medicine

## 2021-01-22 DIAGNOSIS — R2689 Other abnormalities of gait and mobility: Secondary | ICD-10-CM | POA: Insufficient documentation

## 2021-01-22 DIAGNOSIS — Z9181 History of falling: Secondary | ICD-10-CM | POA: Diagnosis present

## 2021-01-22 DIAGNOSIS — M6281 Muscle weakness (generalized): Secondary | ICD-10-CM | POA: Diagnosis present

## 2021-01-22 DIAGNOSIS — R2681 Unsteadiness on feet: Secondary | ICD-10-CM | POA: Insufficient documentation

## 2021-01-22 DIAGNOSIS — M545 Low back pain, unspecified: Secondary | ICD-10-CM | POA: Insufficient documentation

## 2021-01-22 DIAGNOSIS — G8929 Other chronic pain: Secondary | ICD-10-CM | POA: Diagnosis present

## 2021-01-22 NOTE — Therapy (Signed)
[place] [place], [place], [address] Phone: [phone]   Fax:  [phone]  Physical Therapy Treatment  Patient Details  Name: Penny [place] MRN: [id number] Date of Birth: 1966/05/22 Referring Provider (PT): Hilts   Encounter Date: 01/22/2021   PT End of Session - 01/22/21 0928    Visit Number 6    Number of Visits 9    Date for PT Re-Evaluation 02/07/21    PT Start Time 0929    PT Stop Time 1013    PT Time Calculation (min) 44 min    Activity Tolerance Patient tolerated treatment well    Behavior During Therapy [facility] for tasks assessed/performed           Past Medical History:  Diagnosis Date  . Allergy   . Anxiety   . Atrial fibrillation ([place])   . Heart murmur    ARRHYTHMIA  . Hyperlipidemia     Past Surgical History:  Procedure Laterality Date  . HYSTEROSCOPY W/ ENDOMETRIAL ABLATION  2011    There were no vitals filed for this visit.   Subjective Assessment - 01/22/21 0930    Subjective [place] [employer] this week and planning on going for first time this week. Bloodsugar and fatigue have been better. was out for 3 hours with daughter yesterday and had some fatigue towards the end, back pain but that is first time she has been out and doing things for that long in a while    Patient Stated Goals to be able to walk longer distances for shopping and community participation    Currently in Pain? No/denies                             [facility] Adult PT Treatment/Exercise - 01/22/21 0001      Ambulation/Gait   Gait Comments Gait outisde up and down slope~ 469f, some fatigue last 552fand over curb      Lumbar Exercises: Aerobic   Recumbent Bike L2.3 x 7 min      Lumbar Exercises: Standing   Other Standing Lumbar Exercises Shoulder Ext/ Row 10# x10 each      Lumbar Exercises: Seated   Other Seated Lumbar Exercises split stance sit to stand x 2 B - education on substituting this  for step up at home since step at home is much steeper and causing knee pain incr    Other Seated Lumbar Exercises --      Knee/Hip Exercises: Machines for Strengthening   Cybex Knee Extension 10# 2x10    Cybex Knee Flexion 20# 2x10      Knee/Hip Exercises: Standing   Walking with Sports Cord lateral and backward x3, 30#    Other Standing Knee Exercises 3# ankle weights marching and ABD x20   ski poles for balance initially but able to complete second set without UE assist and good stability.     Knee/Hip Exercises: Seated   Sit to Sand 2 sets;without UE support   12 reps each, on airex                   PT Short Term Goals - 12/25/20 1014      PT SHORT TERM GOAL #1   Title Independent with initial HEP    Status Achieved      PT SHORT TERM GOAL #2   Title Pt will demo good understanding of activity pacing and energy  conservation techniques    Status Partially Met             PT Long Term Goals - 01/08/21 1011      PT LONG TERM GOAL #1   Title Pt will be independent with advanced HEP    Status On-going      PT LONG TERM GOAL #2   Title Pt will be able to compllete TUG in < 12 seconds with RPE < 2/10 to demo improved gait speed and stability with decr falls risk    Status On-going      PT LONG TERM GOAL #3   Title Berg score improvement by at least 8 points to demonstrate improved falls risk    Status On-going      PT LONG TERM GOAL #4   Title Pt will report decreased fatigue with community ambulation to facilitate ability to go grocery shopping.    Status Partially Met      PT LONG TERM GOAL #5   Title pt will be able to negotiate stairs with reciprocal pattern, no greater than 1 HR, and with RPE < 2/10 to facilitate safety with stairs at home    Status On-going      PT LONG TERM GOAL #6   Title Gait speed improvement to at least 1.3 m/s to facilitate safe community ambulation    Status On-going                 Plan - 01/22/21 0934     Clinical Impression Statement Pt tolerated treatment well today with progression of strength and endurance. Some fatigue towards end of time on bike today. Outdoor amb progressed with increased distance and less fatigue. She is progressing nicely towards goals and we discussed today potential discharge to independent home and gym program in a few sessions, plan to reassess progress towards goals,  review an updated  HEP and machines next visit with possible discharge.    PT Treatment/Interventions ADLs/Self Care Home Management;Cryotherapy;Electrical Stimulation;Moist Heat;Gait training;Neuromuscular re-education;Therapeutic exercise;Therapeutic activities;Functional mobility training;Patient/family education;Manual techniques;Energy conservation;Vestibular    PT Next Visit Plan continue gradual prgression of TE, balance, endurance. Continue to build tolerance to increased repetitions of exercise and increased gait speed as tolerated.    Consulted and Agree with Plan of Care Patient           Patient will benefit from skilled therapeutic intervention in order to improve the following deficits and impairments:  Abnormal gait,Decreased range of motion,Difficulty walking,Obesity,Decreased endurance,Decreased activity tolerance,Pain,Improper body mechanics,Decreased balance,Decreased mobility,Decreased strength,Postural dysfunction,Impaired flexibility  Visit Diagnosis: Chronic low back pain, unspecified back pain laterality, unspecified whether sciatica present  History of falling  Unsteadiness on feet  Other abnormalities of gait and mobility  Muscle weakness (generalized)     Problem List Patient Active Problem List   Diagnosis Date Noted  . Morbid obesity with BMI of 50.0-59.9, adult ([place]) 11/25/2020  . Acute hypoxemic respiratory failure due to COVID-19 ([place]) 07/26/2020  . Hyperlipidemia 07/26/2020  . Hyperglycemia 07/26/2020  . Anxiety 02/17/2011  . Seasonal allergies 02/17/2011     Hall Busing, PT, DPT 01/22/2021, 10:19 AM  [place]. [place], [place], [address] Phone: [phone]   Fax:  [phone]  Name: Penny [place] MRN: [id number] Date of Birth: 01-19-1966

## 2021-02-03 ENCOUNTER — Other Ambulatory Visit: Payer: Self-pay

## 2021-02-03 ENCOUNTER — Ambulatory Visit: Payer: 59

## 2021-02-03 DIAGNOSIS — Z9181 History of falling: Secondary | ICD-10-CM

## 2021-02-03 DIAGNOSIS — R2689 Other abnormalities of gait and mobility: Secondary | ICD-10-CM

## 2021-02-03 DIAGNOSIS — M545 Low back pain, unspecified: Secondary | ICD-10-CM

## 2021-02-03 DIAGNOSIS — G8929 Other chronic pain: Secondary | ICD-10-CM

## 2021-02-03 DIAGNOSIS — M6281 Muscle weakness (generalized): Secondary | ICD-10-CM

## 2021-02-03 DIAGNOSIS — R2681 Unsteadiness on feet: Secondary | ICD-10-CM

## 2021-02-03 NOTE — Therapy (Signed)
[place]. [place], [place], [address] Phone: [phone]   Fax:  [phone]  Physical Therapy Treatment  Patient Details  Name: Penny [place] MRN: [id number] Date of Birth: 1965-12-11 Referring Provider (PT): Hilts   Encounter Date: 02/03/2021   PT End of Session - 02/03/21 0808    Visit Number 7    Number of Visits 9    Date for PT Re-Evaluation 03/10/21    PT Start Time 0800    PT Stop Time 0845    PT Time Calculation (min) 45 min    Activity Tolerance Patient tolerated treatment well    Behavior During Therapy [facility] for tasks assessed/performed           Past Medical History:  Diagnosis Date  . Allergy   . Anxiety   . Atrial fibrillation ([place])   . Heart murmur    ARRHYTHMIA  . Hyperlipidemia     Past Surgical History:  Procedure Laterality Date  . HYSTEROSCOPY W/ ENDOMETRIAL ABLATION  2011    There were no vitals filed for this visit.   Subjective Assessment - 02/03/21 0806    Subjective Went to [employer] a couple of times but got lightheaded when getting off of the bike which made her hesitant to return. Denies any falls.    Patient Stated Goals to be able to walk longer distances for shopping and community participation    Currently in Pain? No/denies              [facility] PT Assessment - 02/03/21 0001      Assessment   Medical Diagnosis R53.82 (ICD-10-CM) - Chronic fatigue  S30.1XXS (ICD-10-CM) - Abdominal wall hematoma, sequela  M54.50,G89.29 (ICD-10-CM) - Chronic bilateral low back pain without sciatica    Referring Provider (PT) Hilts      Berg Balance Test   Sit to Stand Able to stand without using hands and stabilize independently    Standing Unsupported Able to stand safely 2 minutes    Sitting with Back Unsupported but Feet Supported on Floor or Stool Able to sit safely and securely 2 minutes    Stand to Sit Sits safely with minimal use of hands    Transfers Able to transfer  safely, minor use of hands    Standing Unsupported with Eyes Closed Able to stand 10 seconds safely    Standing Unsupported with Feet Together Able to place feet together independently and stand 1 minute safely    From Standing, Reach Forward with Outstretched Arm Can reach confidently >25 cm (10")    From Standing Position, Pick up Object from Floor Able to pick up shoe safely and easily    From Standing Position, Turn to Look Behind Over each Shoulder Looks behind from both sides and weight shifts well    Turn 360 Degrees Able to turn 360 degrees safely in 4 seconds or less    Standing Unsupported, Alternately Place Feet on Step/Stool Able to stand independently and safely and complete 8 steps in 20 seconds    Standing Unsupported, One Foot in Front Able to plae foot ahead of the other independently and hold 30 seconds    Standing on One Leg Able to lift leg independently and hold > 10 seconds    Total Score 55      Timed Up and Go Test   Normal TUG (seconds) 9   no LOB, good speed, no SPC  [facility] Adult PT Treatment/Exercise - 02/03/21 0001      Lumbar Exercises: Seated   Other Seated Lumbar Exercises STS x 15, x 5  on airex : a lot of fatigue     Knee/Hip Exercises: Standing   Other Standing Knee Exercises HEP update: red TB banded march, hip ext, and hip ABD x 10 B           Education: on energy conservation and activity pacing, safe progression of exercises in gym (use of recumbent bike without resistance for 3-6 minutes and assessing tolerance instead of jumping to level 2 resistance as initially attempted)         PT Short Term Goals - 02/03/21 1660      PT SHORT TERM GOAL #1   Title Independent with initial HEP    Status Achieved      PT SHORT TERM GOAL #2   Title Pt will demo good understanding of activity pacing and energy conservation techniques    Status Achieved             PT Long Term Goals - 02/03/21 0809      PT  LONG TERM GOAL #1   Title Pt will be independent with advanced HEP    Status On-going      PT LONG TERM GOAL #2   Title Pt will be able to compllete TUG in < 12 seconds with RPE < 2/10 to demo improved gait speed and stability with decr falls risk    Status On-going      PT LONG TERM GOAL #3   Title Berg score improvement by at least 8 points to demonstrate improved falls risk    Status Achieved   55/56     PT LONG TERM GOAL #4   Title Pt will report decreased fatigue with community ambulation to facilitate ability to go grocery shopping.    Status Partially Met   was able to do a long walk with daughter a few weeks ago but has not done that since.     PT LONG TERM GOAL #5   Title pt will be able to negotiate stairs with reciprocal pattern, no greater than 1 HR, and with RPE < 2/10 to facilitate safety with stairs at home    Status Achieved   reports 3/10 RPE with stairs, only occasionally holding on to HR at this time. Able to complete reciprocal ins session without issue     PT LONG TERM GOAL #6   Title Gait speed improvement to at least 1.3 m/s to facilitate safe community ambulation    Status Achieved                 Plan - 02/03/21 0821    Clinical Impression Statement Penny [place] has made excellent progress towards goals for overal gait, balance meeting goals for the BERG and TUG. However she continues to be limited by fatigue d/t overall low endurance. Updated HEP provided today with progressions to further work on aerobic capacity and overall endurance. Plan to continue for 2 more visits, 1x every other week with plan for progression towards independent gym /home program    PT Frequency Biweekly    PT Duration --   2 more visits   PT Treatment/Interventions ADLs/Self Care Home Management;Cryotherapy;Electrical Stimulation;Moist Heat;Gait training;Neuromuscular re-education;Therapeutic exercise;Therapeutic activities;Functional mobility training;Patient/family education;Manual  techniques;Energy conservation;Vestibular    PT Next Visit Plan continue gradual prgression of TE, balance, endurance. Continue to build tolerance to increased repetitions of exercise and  increased gait speed as tolerated.    Consulted and Agree with Plan of Care Patient           Patient will benefit from skilled therapeutic intervention in order to improve the following deficits and impairments:  Abnormal gait,Decreased range of motion,Difficulty walking,Obesity,Decreased endurance,Decreased activity tolerance,Pain,Improper body mechanics,Decreased balance,Decreased mobility,Decreased strength,Postural dysfunction,Impaired flexibility  Visit Diagnosis: Chronic low back pain, unspecified back pain laterality, unspecified whether sciatica present - Plan: PT plan of care cert/re-cert  History of falling - Plan: PT plan of care cert/re-cert  Unsteadiness on feet - Plan: PT plan of care cert/re-cert  Other abnormalities of gait and mobility - Plan: PT plan of care cert/re-cert  Muscle weakness (generalized) - Plan: PT plan of care cert/re-cert     Problem List Patient Active Problem List   Diagnosis Date Noted  . Morbid obesity with BMI of 50.0-59.9, adult ([place]) 11/25/2020  . Acute hypoxemic respiratory failure due to COVID-19 ([place]) 07/26/2020  . Hyperlipidemia 07/26/2020  . Hyperglycemia 07/26/2020  . Anxiety 02/17/2011  . Seasonal allergies 02/17/2011    Hall Busing, PT, DPT 02/03/2021, 9:48 AM  [place]. [place], [place], [address] Phone: [phone]   Fax:  [phone]  Name: Penny [place] MRN: [id number] Date of Birth: [birth year]

## 2021-02-17 ENCOUNTER — Other Ambulatory Visit: Payer: Self-pay

## 2021-02-17 ENCOUNTER — Ambulatory Visit: Payer: 59 | Attending: Family Medicine | Admitting: Physical Therapy

## 2021-02-17 ENCOUNTER — Encounter: Payer: Self-pay | Admitting: Physical Therapy

## 2021-02-17 DIAGNOSIS — Z9181 History of falling: Secondary | ICD-10-CM

## 2021-02-17 DIAGNOSIS — M6281 Muscle weakness (generalized): Secondary | ICD-10-CM

## 2021-02-17 DIAGNOSIS — G8929 Other chronic pain: Secondary | ICD-10-CM | POA: Diagnosis present

## 2021-02-17 DIAGNOSIS — M545 Low back pain, unspecified: Secondary | ICD-10-CM

## 2021-02-17 DIAGNOSIS — R2689 Other abnormalities of gait and mobility: Secondary | ICD-10-CM | POA: Diagnosis present

## 2021-02-17 DIAGNOSIS — R2681 Unsteadiness on feet: Secondary | ICD-10-CM

## 2021-02-17 NOTE — Therapy (Signed)
[place]. [place], [place], [address] Phone: [phone]   Fax:  [phone]  Physical Therapy Treatment  Patient Details  Name: Penny [place] MRN: [id number] Date of Birth: November 30, 1965 Referring Provider (PT): Hilts   Encounter Date: 02/17/2021   PT End of Session - 02/17/21 0842    Visit Number 8    Date for PT Re-Evaluation 03/10/21    Authorization Type [employer]    PT Start Time 0800    PT Stop Time 0843    PT Time Calculation (min) 43 min    Activity Tolerance Patient tolerated treatment well    Behavior During Therapy [facility] for tasks assessed/performed           Past Medical History:  Diagnosis Date  . Allergy   . Anxiety   . Atrial fibrillation ([place])   . Heart murmur    ARRHYTHMIA  . Hyperlipidemia     Past Surgical History:  Procedure Laterality Date  . HYSTEROSCOPY W/ ENDOMETRIAL ABLATION  2011    There were no vitals filed for this visit.   Subjective Assessment - 02/17/21 0802    Subjective Went to the gym yesterday, reports husband had to help her get into the house afterwards. Stated that she had some unsteadiness and low blood sugar    Currently in Pain? No/denies                             [facility] Adult PT Treatment/Exercise - 02/17/21 0001      Ambulation/Gait   Gait Comments GAit outside around back buliding up and down slope one standing rest towards end      Lumbar Exercises: Aerobic   Nustep L4  x 63mn      Lumbar Exercises: Standing   Other Standing Lumbar Exercises Shoulder Ext 10# 2x10      Lumbar Exercises: Seated   Other Seated Lumbar Exercises STS 2x10 w/ [facility] yellow ball      Knee/Hip Exercises: Standing   Forward Step Up Both;4 sets;5 reps;Hand Hold: 0;Step Height: 6"    Walking with Sports Cord lateral and backward x3, 30#                    PT Short Term Goals - 02/03/21 00076     PT SHORT TERM GOAL #1   Title Independent with  initial HEP    Status Achieved      PT SHORT TERM GOAL #2   Title Pt will demo good understanding of activity pacing and energy conservation techniques    Status Achieved             PT Long Term Goals - 02/17/21 0817      PT LONG TERM GOAL #1   Title Pt will be independent with advanced HEP    Status Partially Met      PT LONG TERM GOAL #4   Title Pt will report decreased fatigue with community ambulation to facilitate ability to go grocery shopping.    Status Partially Met                 Plan - 02/17/21 0842    Clinical Impression Statement Pt continues to progress towards goals. Functional activity selected today, fatigue noted throughout session. Pt gives good effort and expresses a desire to get better. Increase outside gait distance without issues. Cues needed for core engagement with  shoulder extension. No reports of pain. Reports that  nutrition issue may be the reason for her gym issues.    Personal Factors and Comorbidities Comorbidity 3+    Comorbidities obesity, atrial fibrillation, hyperlipidemia, anxiety, SOB, COVID 11/9, DM, L knee arthritis    Examination-Activity Limitations Locomotion Level;Squat;Stairs    Examination-Participation Restrictions Community Activity;Interpersonal Relationship;Shop    Stability/Clinical Decision Making Evolving/Moderate complexity    Rehab Potential Good    PT Frequency Biweekly    PT Treatment/Interventions ADLs/Self Care Home Management;Cryotherapy;Electrical Stimulation;Moist Heat;Gait training;Neuromuscular re-education;Therapeutic exercise;Therapeutic activities;Functional mobility training;Patient/family education;Manual techniques;Energy conservation;Vestibular    PT Next Visit Plan continue gradual prgression of TE, balance, endurance. Continue to build tolerance to increased repetitions of exercise and increased gait speed as tolerated.           Patient will benefit from skilled therapeutic intervention in order  to improve the following deficits and impairments:  Abnormal gait,Decreased range of motion,Difficulty walking,Obesity,Decreased endurance,Decreased activity tolerance,Pain,Improper body mechanics,Decreased balance,Decreased mobility,Decreased strength,Postural dysfunction,Impaired flexibility  Visit Diagnosis: Other abnormalities of gait and mobility  Muscle weakness (generalized)  Unsteadiness on feet  History of falling  Chronic low back pain, unspecified back pain laterality, unspecified whether sciatica present     Problem List Patient Active Problem List   Diagnosis Date Noted  . Morbid obesity with BMI of 50.0-59.9, adult ([place]) 11/25/2020  . Acute hypoxemic respiratory failure due to COVID-19 ([place]) 07/26/2020  . Hyperlipidemia 07/26/2020  . Hyperglycemia 07/26/2020  . Anxiety 02/17/2011  . Seasonal allergies 02/17/2011    Scot Jun, PTA 02/17/2021, 8:47 AM  [place]. [place], [place], [address] Phone: [phone]   Fax:  [phone]  Name: Penny [place] MRN: [id number] Date of Birth: 15-Sep-1965

## 2021-03-03 ENCOUNTER — Ambulatory Visit: Payer: 59 | Admitting: Physical Therapy

## 2021-03-05 ENCOUNTER — Encounter (HOSPITAL_BASED_OUTPATIENT_CLINIC_OR_DEPARTMENT_OTHER): Payer: Self-pay | Admitting: Family Medicine

## 2021-03-05 ENCOUNTER — Ambulatory Visit (INDEPENDENT_AMBULATORY_CARE_PROVIDER_SITE_OTHER): Payer: 59 | Admitting: Family Medicine

## 2021-03-05 ENCOUNTER — Other Ambulatory Visit: Payer: Self-pay

## 2021-03-05 VITALS — BP 168/106 | HR 67 | Ht 66.0 in | Wt 316.0 lb

## 2021-03-05 DIAGNOSIS — R030 Elevated blood-pressure reading, without diagnosis of hypertension: Secondary | ICD-10-CM

## 2021-03-05 DIAGNOSIS — E785 Hyperlipidemia, unspecified: Secondary | ICD-10-CM

## 2021-03-05 DIAGNOSIS — E119 Type 2 diabetes mellitus without complications: Secondary | ICD-10-CM | POA: Diagnosis not present

## 2021-03-05 LAB — POCT UA - MICROALBUMIN
Albumin/Creatinine Ratio, Urine, POC: <30
Creatinine, POC: 200 mg/dL
Microalbumin Ur, POC: 30 mg/L

## 2021-03-05 NOTE — Patient Instructions (Signed)
  Medication Instructions:  Your physician recommends that you continue on your current medications as directed. Please refer to the Current Medication list given to you today. --If you need a refill on any your medications before your next appointment, please call your pharmacy first. If no refills are authorized on file call the office.--  Lab Work: Your physician has recommended that you have lab work today: Lipid Profile, A1C, and Urine Microalbumin If you have labs (blood work) drawn today and your tests are completely normal, you will receive your results via MyChart message OR a phone call from our staff.  Please ensure you check your voicemail in the event that you authorized detailed messages to be left on a delegated number. If you have any lab test that is abnormal or we need to change your treatment, we will call you to review the results.  Referrals/Procedures/Imaging: A referral has been placed for you to Diabetic Nutritionist for evaluation and treatment. Someone from the scheduling department will be in contact with you in regards to coordinating your consultation. If you do not hear from any of the schedulers within 7-10 business days please give our office a call.  Follow-Up: Your next appointment:   Your physician recommends that you schedule a follow-up appointment in: 1 MONTH with Dr. de Peru  Thanks for letting us be apart of your health journey!!  Primary Care and Sports Medicine   Dr. Ceasar Mons Peru   We encourage you to activate your patient portal called "MyChart".  Sign up information is provided on this After Visit Summary.  MyChart is used to connect with patients for Virtual Visits (Telemedicine).  Patients are able to view lab/test results, encounter notes, upcoming appointments, etc.  Non-urgent messages can be sent to your provider as well. To learn more about what you can do with MyChart, please visit --  [email or web address].

## 2021-03-05 NOTE — Progress Notes (Addendum)
New Patient Office Visit  Subjective:  Patient ID: Penny Huerta, female    DOB: Aug 27, 1966  Age: 55 y.o. MRN: [id number]  CC:  Chief Complaint  Patient presents with   Establish Care    Prior PCP Dr Prince Rome, only saw 1 time   Diabetes    Patient states she found out she had DM in November while being treated for Covid. She states she did receive Metformin and had one follow up visit with Prior PCP but no management in the last few months   Hypertension    Patient blood pressure is elevated. She states it tends to be higher in the doctors office since she's had covid. She reports that it "runs normal" at home with her systolic being in the 110's-120's    HPI Penny Huerta is a 55 year old female presenting to establish in clinic.  She has questions today regarding diagnosis of diabetes as well as related to coronavirus.  Coronavirus infection: In November 2021, patient ended up contracting coronavirus infection which led to hospitalization with a hospital course that lasted for multiple weeks.  At time of presentation, she was noted to have abnormal blood sugars and last hemoglobin A1c was checked and was found to be elevated at 7.6%.  She reports significant deconditioning during her time in the hospital and that upon returning home she had notable fatigue, difficulty with ADLs.  She required assistance from her husband and also underwent physical therapy to try to restore strength.  She reports that she has made improvements, but still does not feel back to her baseline.  Diabetes: Patient was found to have abnormal blood sugars at time of presentation for coronavirus infection.  A1c was checked and was found to be 7.6%.  She has currently been taking metformin 500 mg twice daily.  She has tried various diets in regards to weight loss/blood sugar management including keto.  Most recent A1c was about 3 months ago and was found to be 6.4% she has not had pneumonia vaccine.  She does follow with  an [facility]mologist, [facility].  Does have some questions regarding appropriate dietary measures related to diagnosis of diabetes.  Elevated blood pressure: Denies history of hypertension.  BP at last office visit with PCP in March was slightly elevated at 155/81.  She does have blood pressure cuff at home, does not necessarily check regularly.  Reports known hyperlipidemia, but has not tolerated statin therapy in the past.  Believes she was on Lipitor before and had adverse reaction to this, primarily significant myalgias.  This is led to some concerns regarding trying any other statin medications or cholesterol-lowering therapies.  Past Medical History:  Diagnosis Date   Allergy    Anxiety    Atrial fibrillation (HCC)    Heart murmur    ARRHYTHMIA   Hyperlipidemia     Past Surgical History:  Procedure Laterality Date   HYSTEROSCOPY W/ ENDOMETRIAL ABLATION  2011    Family History  Problem Relation Age of Onset   Hyperlipidemia Mother    Transient ischemic attack Mother    Hypertension Mother    Diabetes Mother    Transient ischemic attack Father    Ovarian cancer Paternal Grandmother    Hyperlipidemia Maternal Grandfather    Heart disease Maternal Grandfather    Hyperlipidemia Maternal Grandmother    Heart disease Maternal Grandmother    Stroke Paternal Grandfather    Hypertension Paternal Grandfather    Diabetes Paternal Grandfather    Sudden death  Maternal Uncle     Social History   Socioeconomic History   Marital status: Married    Spouse name: Not on file   Number of children: Not on file   Years of education: Not on file   Highest education level: Not on file  Occupational History   Occupation: educating Curator  Tobacco Use   Smoking status: Former    Years: 20.00    Pack years: 0.00    Types: Cigarettes    Quit date: 09/14/1997    Years since quitting: 23.4   Smokeless tobacco: Never  Substance and Sexual Activity   Alcohol use:  No   Drug use: No   Sexual activity: Yes    Partners: Male  Other Topics Concern   Not on file  Social History Narrative   Exercise-- none   Social Determinants of Health   Financial Resource Strain: Not on file  Food Insecurity: Not on file  Transportation Needs: Not on file  Physical Activity: Not on file  Stress: Not on file  Social Connections: Not on file  Intimate Partner Violence: Not on file    Objective:   Today's Vitals: BP (!) 168/106   Pulse 67   Ht 5\' 6"  (1.676 m)   Wt (!) 316 lb (143.3 kg)   LMP 01/06/2012   SpO2 95%   BMI 51.00 kg/m   Physical Exam  Pleasant 55 year old female in no acute distress Cardiovascular exam with regular rate and rhythm Lungs clear to auscultation bilaterally  Assessment & Plan:   Problem List Items Addressed This Visit       Endocrine   Diabetes mellitus (HCC) - Primary    Most recent hemoglobin A1c a few months ago was 6.4% which is at goal Check A1c today Check urine microalbumin/creatinine ratio Check lipid panel Request records from ophthalmology office Discussed recommendation for pneumonia vaccine, patient to consider Will refer to nutritionist for diabetic education Provided handout on glycemic index Long discussion regarding diagnosis, treatment, prognosis and importance of controlling blood sugars with appropriate A1c discussed Injectable medication such as Ozempic may be worth considering related to diabetes and with benefit of promoting weight loss       Relevant Orders   POCT UA - Microalbumin (Completed)   Lipid panel (Completed)   Amb ref to Medical Nutrition Therapy-MNT   Hemoglobin A1c     Other   Hyperlipidemia    Has been known about in the past, patient was on statin therapy at one point but did not tolerate due to significant myalgias Did discuss that in setting of diabetes, controlling cholesterol numbers is very important; she understandably has some concerns related to prior experience  with statin therapy Did discuss that there is a lipid clinic that we could look to refer patient to in order to discuss alternative options.  Patient will consider this.       Elevated blood pressure reading in office without diagnosis of hypertension    Blood pressure elevated in office today, was slightly elevated at prior PCP appointment in March as well She does have blood pressure cuff at home, blood pressure log provided to patient today, instructed to check blood pressure at least 3-4 times per week at different times of the day and record this on the provided log.  We will review at next visit and determine most appropriate next steps Recommend beginning lifestyle modifications including DASH diet, increased physical activity, working towards gradual healthy weight loss  Outpatient Encounter Medications as of 03/05/2021  Medication Sig   B Complex Vitamins (VITAMIN B COMPLEX PO) Take by mouth daily.   Coenzyme Q10 (CO Q-10) 100 MG CAPS Take by mouth daily.   ibuprofen (ADVIL) 200 MG tablet Take 800 mg by mouth every 6 (six) hours as needed for fever, headache or mild pain.   metFORMIN (GLUCOPHAGE) 500 MG tablet Take 1 tablet (500 mg total) by mouth 2 (two) times daily with a meal.   Multiple Vitamin (MULTIVITAMIN) tablet Take 1 tablet by mouth daily.   No facility-administered encounter medications on file as of 03/05/2021.   Spent 60 minutes on this patient encounter, including preparation, chart review, face-to-face counseling with patient and coordination of care, and documentation of encounter  Follow-up: Return in about 4 weeks (around 04/02/2021).  At next visit, will review labs, blood pressure log.  Reassess interested in pneumonia vaccine, lipid clinic.  Elgie Landino J De [place], MD  Referral placed for lipid disorders clinic.  Patient reports intolerance to statin therapy in the past, she thinks it was Lipitor.  Patient with diabetes and hyperlipidemia.

## 2021-03-06 LAB — LIPID PANEL
Chol/HDL Ratio: 13.1 ratio — ABNORMAL HIGH (ref 0.0–4.4)
Cholesterol, Total: 340 mg/dL — ABNORMAL HIGH (ref 100–199)
HDL: 26 mg/dL — ABNORMAL LOW (ref >39)
LDL Chol Calc (NIH): 249 mg/dL — ABNORMAL HIGH (ref 0–99)
Triglycerides: 298 mg/dL — ABNORMAL HIGH (ref 0–149)
VLDL Cholesterol Cal: 65 mg/dL — ABNORMAL HIGH (ref 5–40)

## 2021-03-06 LAB — HEMOGLOBIN A1C
Est. average glucose Bld gHb Est-mCnc: 120 mg/dL
Hgb A1c MFr Bld: 5.8 % — ABNORMAL HIGH (ref 4.8–5.6)

## 2021-03-07 DIAGNOSIS — R030 Elevated blood-pressure reading, without diagnosis of hypertension: Secondary | ICD-10-CM | POA: Insufficient documentation

## 2021-03-07 DIAGNOSIS — E119 Type 2 diabetes mellitus without complications: Secondary | ICD-10-CM | POA: Insufficient documentation

## 2021-03-07 NOTE — Assessment & Plan Note (Signed)
Has been known about in the past, patient was on statin therapy at one point but did not tolerate due to significant myalgias Did discuss that in setting of diabetes, controlling cholesterol numbers is very important; she understandably has some concerns related to prior experience with statin therapy Did discuss that there is a lipid clinic that we could look to refer patient to in order to discuss alternative options.  Patient will consider this.

## 2021-03-07 NOTE — Assessment & Plan Note (Addendum)
Most recent hemoglobin A1c a few months ago was 6.4% which is at goal Check A1c today Check urine microalbumin/creatinine ratio Check lipid panel Request records from ophthalmology office Discussed recommendation for pneumonia vaccine, patient to consider Will refer to nutritionist for diabetic education Provided handout on glycemic index Long discussion regarding diagnosis, treatment, prognosis and importance of controlling blood sugars with appropriate A1c discussed Injectable medication such as Ozempic may be worth considering related to diabetes and with benefit of promoting weight loss

## 2021-03-07 NOTE — Assessment & Plan Note (Signed)
Blood pressure elevated in office today, was slightly elevated at prior PCP appointment in March as well She does have blood pressure cuff at home, blood pressure log provided to patient today, instructed to check blood pressure at least 3-4 times per week at different times of the day and record this on the provided log.  We will review at next visit and determine most appropriate next steps Recommend beginning lifestyle modifications including DASH diet, increased physical activity, working towards gradual healthy weight loss

## 2021-03-11 ENCOUNTER — Ambulatory Visit: Payer: 59

## 2021-03-11 ENCOUNTER — Encounter (HOSPITAL_BASED_OUTPATIENT_CLINIC_OR_DEPARTMENT_OTHER): Payer: Self-pay | Admitting: Family Medicine

## 2021-03-11 NOTE — Addendum Note (Signed)
Addended by: DE Peru, Joseandres Mazer J on: 03/11/2021 04:43 PM   Modules accepted: Orders

## 2021-03-12 ENCOUNTER — Telehealth (HOSPITAL_BASED_OUTPATIENT_CLINIC_OR_DEPARTMENT_OTHER): Payer: Self-pay

## 2021-03-12 NOTE — Telephone Encounter (Signed)
-----   Message from Hosie Poisson Peru, MD sent at 03/11/2021  4:40 PM EDT ----- Hemoglobin A1c is 5.8% which is very good.  Would continue with metformin at this time.  Microalbumin/creatinine ratio is normal which is reassuring regarding kidney function. Lipid panel with elevated total cholesterol, elevated "bad" cholesterol with low "good" cholesterol.  Would focus on lifestyle modifications to address cholesterol issues.  This will include dietary changes - incorporating fresh fruits and vegetables, lean protein in the diet and reducing consumption of red meats, saturated fats, processed foods.  Recommend gradually increasing level of activity.  As discussed in the office, would recommend referral to lipid clinic.  They will be having patient seen here at [facility].  The findings from the lipid panel in conjunction with known diabetes do increase risk of heart attack and stroke and thus evaluation at lipid clinic would primarily be to discuss optimal treatment to lower this risk.

## 2021-03-12 NOTE — Telephone Encounter (Signed)
Results released by Dr. de Peru and reviewed by patient via MyChart Instructed patient to contact the office with any questions or concerns. Referral placed to Dr. Zoila Shutter at [facility] for Lipid management

## 2021-03-18 ENCOUNTER — Ambulatory Visit: Payer: 59

## 2021-03-25 ENCOUNTER — Ambulatory Visit: Payer: 59

## 2021-04-04 ENCOUNTER — Ambulatory Visit (HOSPITAL_BASED_OUTPATIENT_CLINIC_OR_DEPARTMENT_OTHER): Payer: 59 | Admitting: Family Medicine

## 2021-04-10 ENCOUNTER — Other Ambulatory Visit: Payer: Self-pay

## 2021-04-10 ENCOUNTER — Ambulatory Visit (INDEPENDENT_AMBULATORY_CARE_PROVIDER_SITE_OTHER): Payer: 59 | Admitting: Family Medicine

## 2021-04-10 ENCOUNTER — Encounter (HOSPITAL_BASED_OUTPATIENT_CLINIC_OR_DEPARTMENT_OTHER): Payer: Self-pay | Admitting: Family Medicine

## 2021-04-10 VITALS — BP 162/84 | HR 94 | Ht 66.0 in | Wt 321.4 lb

## 2021-04-10 DIAGNOSIS — R030 Elevated blood-pressure reading, without diagnosis of hypertension: Secondary | ICD-10-CM

## 2021-04-10 DIAGNOSIS — E785 Hyperlipidemia, unspecified: Secondary | ICD-10-CM

## 2021-04-10 DIAGNOSIS — Z23 Encounter for immunization: Secondary | ICD-10-CM | POA: Diagnosis not present

## 2021-04-10 DIAGNOSIS — E119 Type 2 diabetes mellitus without complications: Secondary | ICD-10-CM

## 2021-04-10 DIAGNOSIS — Z6841 Body Mass Index (BMI) 40.0 and over, adult: Secondary | ICD-10-CM

## 2021-04-10 NOTE — Progress Notes (Signed)
    Procedures performed today:    None.  Independent interpretation of notes and tests performed by another provider:   None.  Brief History, Exam, Impression, and Recommendations:    Reports that she has been feeling tired, has noticed that blood sugars have been off the last couple of weeks. Unsure why this may be. Feels that fatigue has been relatively stable compared to before last appt.   BP (!) 162/84   Pulse 94   Ht 5\' 6"  (1.676 m)   Wt (!) 321 lb 6.4 oz (145.8 kg)   LMP 01/06/2012   SpO2 95%   BMI 51.88 kg/m   Diabetes mellitus (HCC) Recent A1c 5.8% Has been tolerating metformin - taking metformin 500 mg BID Scheduled to see nutritionist Aug 16 She reports that she has been checking her blood sugar intermittently and has been having fasting blood sugars near 200 a few times in the past week Discussed recommendation for pneumonia vaccine again today -patient elected to proceed with this, vaccination provided today Most recent A1c was at goal, however patient reporting abnormal blood sugars, instructed to keep a written log for Aug 18 to review Considerations for more pharmacotherapy titration included titrating metformin to 1000 g twice daily, addition of injectable GLP-1 agonist, SGLT2 inhibitor, sulfonylurea, this would likely be based upon cost feasibility Plan to recheck A1c in about 3 months  Hyperlipidemia Had issues tolerating statin in the past Has been referred to lipid clinic through cardiology - scheduled to see them in November  Elevated blood pressure reading in office without diagnosis of hypertension BP has been elevated at prior office visit Reports that she has checked her BP at home with hoome blood pressure cuff - reports she tends to have readings that are 140/80 Today, BP is slightly elevated in the office, will continue to keep a close eye on this  Morbid obesity with BMI of 50.0-59.9, adult (HCC) Given underlying diabetes, patient would likely  benefit from injectable GLP-1 agonist, discussed possible Ozempic, although this may be cost prohibitive Patient to consider  Spent 35 minutes on this patient encounter, including preparation, chart review, face-to-face counseling with patient and coordination of care, and documentation of encounter  Plan for follow-up in about 6 weeks or sooner as needed  ___________________________________________ Rynell Ciotti de 01-10-1994, MD, ABFM, [facility] Medicine [facility]

## 2021-04-10 NOTE — Assessment & Plan Note (Addendum)
Had issues tolerating statin in the past Has been referred to lipid clinic through cardiology - scheduled to see them in November

## 2021-04-10 NOTE — Assessment & Plan Note (Addendum)
BP has been elevated at prior office visit Reports that she has checked her BP at home with hoome blood pressure cuff - reports she tends to have readings that are 140/80 Today, BP is slightly elevated in the office, will continue to keep a close eye on this

## 2021-04-10 NOTE — Patient Instructions (Signed)
  Medication Instructions:  Your physician recommends that you continue on your current medications as directed. Please refer to the Current Medication list given to you today. --If you need a refill on any your medications before your next appointment, please call your pharmacy first. If no refills are authorized on file call the office.--  Follow-Up: Your next appointment:   Your physician recommends that you schedule a follow-up appointment in: 4-6 WEEKS with Dr. de [place]  Thanks for letting us be apart of your health journey!!  Primary Care and Sports Medicine   Dr. Raymond de [place]   We encourage you to activate your patient portal called "MyChart".  Sign up information is provided on this After Visit Summary.  MyChart is used to connect with patients for Virtual Visits (Telemedicine).  Patients are able to view lab/test results, encounter notes, upcoming appointments, etc.  Non-urgent messages can be sent to your provider as well. To learn more about what you can do with MyChart, please visit --  [email or web address].    

## 2021-04-10 NOTE — Assessment & Plan Note (Addendum)
Recent A1c 5.8% Has been tolerating metformin - taking metformin 500 mg BID Scheduled to see nutritionist Aug 16 She reports that she has been checking her blood sugar intermittently and has been having fasting blood sugars near 200 a few times in the past week Discussed recommendation for pneumonia vaccine again today -patient elected to proceed with this, vaccination provided today Most recent A1c was at goal, however patient reporting abnormal blood sugars, instructed to keep a written log for [place] to review Considerations for more pharmacotherapy titration included titrating metformin to 1000 g twice daily, addition of injectable GLP-1 agonist, SGLT2 inhibitor, sulfonylurea, this would likely be based upon cost feasibility Plan to recheck A1c in about 3 months

## 2021-04-14 NOTE — Assessment & Plan Note (Signed)
Given underlying diabetes, patient would likely benefit from injectable GLP-1 agonist, discussed possible Ozempic, although this may be cost prohibitive Patient to consider

## 2021-04-22 ENCOUNTER — Ambulatory Visit: Payer: 59 | Admitting: Dietician

## 2021-04-29 ENCOUNTER — Ambulatory Visit: Payer: 59 | Admitting: Dietician

## 2021-05-28 ENCOUNTER — Ambulatory Visit (INDEPENDENT_AMBULATORY_CARE_PROVIDER_SITE_OTHER): Payer: 59 | Admitting: Family Medicine

## 2021-05-28 ENCOUNTER — Encounter (HOSPITAL_BASED_OUTPATIENT_CLINIC_OR_DEPARTMENT_OTHER): Payer: Self-pay | Admitting: Family Medicine

## 2021-05-28 ENCOUNTER — Other Ambulatory Visit: Payer: Self-pay

## 2021-05-28 VITALS — BP 168/90 | HR 89 | Ht 66.0 in | Wt 321.0 lb

## 2021-05-28 DIAGNOSIS — Z6841 Body Mass Index (BMI) 40.0 and over, adult: Secondary | ICD-10-CM

## 2021-05-28 DIAGNOSIS — E785 Hyperlipidemia, unspecified: Secondary | ICD-10-CM

## 2021-05-28 DIAGNOSIS — I10 Essential (primary) hypertension: Secondary | ICD-10-CM | POA: Insufficient documentation

## 2021-05-28 DIAGNOSIS — E119 Type 2 diabetes mellitus without complications: Secondary | ICD-10-CM | POA: Diagnosis not present

## 2021-05-28 DIAGNOSIS — R6889 Other general symptoms and signs: Secondary | ICD-10-CM | POA: Diagnosis not present

## 2021-05-28 MED ORDER — DEXCOM G6 TRANSMITTER MISC: 1.0000 | 11 refills | Status: DC

## 2021-05-28 MED ORDER — DEXCOM G6 SENSOR MISC: 3.0000 | 11 refills | Status: DC

## 2021-05-28 MED ORDER — DEXCOM G6 RECEIVER DEVI: 1.0000 | 11 refills | Status: DC

## 2021-05-28 NOTE — Assessment & Plan Note (Signed)
Strongly encouraged to keep appointment with nutritionist Likely would benefit from regular exercise regimen although she has some intolerance at present.  Will be undergoing further evaluation cardiology Working towards gradual, healthy weight loss likely have notable benefit related to control of chronic medical conditions

## 2021-05-28 NOTE — Assessment & Plan Note (Signed)
Blood pressure again elevated in the office today Continues to have elevated readings in the office, no home readings recently Denies any issues with chest pain, headaches Discussed concern related to persistently elevated blood pressure, particular related to long-term complications if left elevated and untreated Discussed recommendation of pharmacotherapy to help in lowering medication At this time, patient wishes to continue with lifestyle modifications, working to control blood pressure with dietary and physical activity changes Continue to monitor at future visits, recommend monitoring intermittently at home

## 2021-05-28 NOTE — Patient Instructions (Signed)
  Medication Instructions:  Your physician recommends that you continue on your current medications as directed. Please refer to the Current Medication list given to you today. --If you need a refill on any your medications before your next appointment, please call your pharmacy first. If no refills are authorized on file call the office.-  Referrals/Procedures/Imaging: A referral has been placed for you to [facility] for evaluation and treatment. Someone from the scheduling department will be in contact with you in regards to coordinating your consultation. If you do not hear from any of the schedulers within 7-10 business days please give our office a call.  Follow-Up: Your next appointment:   Your physician recommends that you schedule a follow-up appointment in: 4-6 WEEKS with Dr. de Peru  Thanks for letting us be apart of your health journey!!  Primary Care and Sports Medicine   Dr. Ceasar Mons Peru   We encourage you to activate your patient portal called "MyChart".  Sign up information is provided on this After Visit Summary.  MyChart is used to connect with patients for Virtual Visits (Telemedicine).  Patients are able to view lab/test results, encounter notes, upcoming appointments, etc.  Non-urgent messages can be sent to your provider as well. To learn more about what you can do with MyChart, please visit --  [email or web address].

## 2021-05-28 NOTE — Assessment & Plan Note (Signed)
Given recent issues with exertional symptoms, will refer to cardiology for further evaluation of any underlying cardiac etiology for the symptoms Patient does have risk factors for possible cardiac disease including diabetes, obesity, hypertension

## 2021-05-28 NOTE — Assessment & Plan Note (Signed)
Previously referred to lipid clinic, reports having appointment arranged for November, encouraged to continue with this

## 2021-05-28 NOTE — Progress Notes (Signed)
Procedures performed today:    None.  Independent interpretation of notes and tests performed by another provider:   None.  Brief History, Exam, Impression, and Recommendations:    BP (!) 168/90   Pulse 89   Ht 5\' 6"  (1.676 m)   Wt (!) 321 lb (145.6 kg)   LMP 01/06/2012   SpO2 96%   BMI 51.81 kg/m   Diabetes mellitus (HCC) Reports that she has been checking her blood sugars at home regularly, primarily with fasting readings as well as intermittently throughout the day.  She has concerns as she feels that blood sugar will vary throughout the day.  She also reports episodes of what she believes are hypoglycemic symptoms where she will feel "clumsy", dizzy, feel as though she is going to pass out.  These will occur while she is exercising, has also occurred while showering and moving around her house. Unfortunately, she does not often check her blood sugars at the times that she is having symptoms.  Feels that when her blood sugars get into the 80s that she feels like she is having symptoms of hypoglycemia. She has been referred previously to establish with nutritionist, unfortunately she has had to cancel this, was arranged for August initially, now thinks that appointment is scheduled in October. Has some questions about use of continuous glucose monitor due to her symptoms and the anxiety that it causes her thinking that she may suddenly have hypoglycemia at that will lead to fall or injury. Current medications include metformin Discussed with patient that severe hyperglycemia would be very unusual given that she is only taking metformin currently and thus should not be at risk of sudden hypoglycemia which can be observed with use of certain medications or with insulin administration Do have some concerns that symptoms tend to occur while exercising as there is a possibility of other underlying cause for her symptoms separate from her diabetes her blood sugars, such as a cardiac  etiology We will attempt to obtain a CGM device for patient to utilize for optimal monitoring of her blood sugars and observe for any true episodes of hypoglycemia when she is having the symptoms We will also refer to cardiology for further evaluation and recommendations given symptoms related to exertion Strongly encouraged her to establish with nutritionist for further discussion regarding management of diabetes, lifestyle modifications Would also be cognizant and cautious with simple carbohydrates in the diet as sudden increase in sugar can lead to spike in insulin resulting in possible rebound hypoglycemia Continue with metformin at this time Plan to check hemoglobin A1c at next office visit  Hypertension Blood pressure again elevated in the office today Continues to have elevated readings in the office, no home readings recently Denies any issues with chest pain, headaches Discussed concern related to persistently elevated blood pressure, particular related to long-term complications if left elevated and untreated Discussed recommendation of pharmacotherapy to help in lowering medication At this time, patient wishes to continue with lifestyle modifications, working to control blood pressure with dietary and physical activity changes Continue to monitor at future visits, recommend monitoring intermittently at home  Exercise intolerance Given recent issues with exertional symptoms, will refer to cardiology for further evaluation of any underlying cardiac etiology for the symptoms Patient does have risk factors for possible cardiac disease including diabetes, obesity, hypertension  Hyperlipidemia Previously referred to lipid clinic, reports having appointment arranged for November, encouraged to continue with this  Morbid obesity with BMI of 50.0-59.9, adult (HCC) Strongly  encouraged to keep appointment with nutritionist Likely would benefit from regular exercise regimen although she has  some intolerance at present.  Will be undergoing further evaluation cardiology Working towards gradual, healthy weight loss likely have notable benefit related to control of chronic medical conditions  Spent 40 minutes on this patient encounter, including preparation, chart review, face-to-face counseling with patient and coordination of care, and documentation of encounter  Plan for follow-up in about 6 weeks or sooner as needed   ___________________________________________ Penny Courser de [place], MD, ABFM, CAQSM Primary [facility]

## 2021-05-28 NOTE — Assessment & Plan Note (Signed)
Reports that she has been checking her blood sugars at home regularly, primarily with fasting readings as well as intermittently throughout the day.  She has concerns as she feels that blood sugar will vary throughout the day.  She also reports episodes of what she believes are hypoglycemic symptoms where she will feel "clumsy", dizzy, feel as though she is going to pass out.  These will occur while she is exercising, has also occurred while showering and moving around her house. Unfortunately, she does not often check her blood sugars at the times that she is having symptoms.  Feels that when her blood sugars get into the 80s that she feels like she is having symptoms of hypoglycemia. She has been referred previously to establish with nutritionist, unfortunately she has had to cancel this, was arranged for August initially, now thinks that appointment is scheduled in October. Has some questions about use of continuous glucose monitor due to her symptoms and the anxiety that it causes her thinking that she may suddenly have hypoglycemia at that will lead to fall or injury. Current medications include metformin Discussed with patient that severe hyperglycemia would be very unusual given that she is only taking metformin currently and thus should not be at risk of sudden hypoglycemia which can be observed with use of certain medications or with insulin administration Do have some concerns that symptoms tend to occur while exercising as there is a possibility of other underlying cause for her symptoms separate from her diabetes her blood sugars, such as a cardiac etiology We will attempt to obtain a CGM device for patient to utilize for optimal monitoring of her blood sugars and observe for any true episodes of hypoglycemia when she is having the symptoms We will also refer to cardiology for further evaluation and recommendations given symptoms related to exertion Strongly encouraged her to establish with  nutritionist for further discussion regarding management of diabetes, lifestyle modifications Would also be cognizant and cautious with simple carbohydrates in the diet as sudden increase in sugar can lead to spike in insulin resulting in possible rebound hypoglycemia Continue with metformin at this time Plan to check hemoglobin A1c at next office visit

## 2021-06-02 ENCOUNTER — Encounter (HOSPITAL_BASED_OUTPATIENT_CLINIC_OR_DEPARTMENT_OTHER): Payer: Self-pay

## 2021-06-19 ENCOUNTER — Encounter: Payer: Self-pay | Admitting: Dietician

## 2021-06-19 ENCOUNTER — Other Ambulatory Visit: Payer: Self-pay

## 2021-06-19 ENCOUNTER — Encounter: Payer: 59 | Attending: Family Medicine | Admitting: Dietician

## 2021-06-19 DIAGNOSIS — E119 Type 2 diabetes mellitus without complications: Secondary | ICD-10-CM | POA: Diagnosis not present

## 2021-06-19 NOTE — Patient Instructions (Signed)
Work towards eating three meals a day, about 5-6 hours apart!  Begin to recognize carbohydrates in your food choices!  Have 3 carb choices at each meal (45 g).   Begin to build your meals using the proportions of the Balanced Plate. First, select your carb choice(s) for the meal, and determine how much you should have to equal 3 carb choices (45 g). Next, select your source of protein to pair with your carb choice(s). Finally, complete the remaining half of your meal with a variety of non-starchy vegetables.

## 2021-06-19 NOTE — Progress Notes (Signed)
Diabetes Self-Management Education  Visit Type: First/Initial  Appt. Start Time: 1530 Appt. End Time: 1640  06/19/2021  [name], identified by name and date of birth, is a 55 y.o. female with a diagnosis of Diabetes: Type 2.   ASSESSMENT Pt husband Penny Huerta is present for their visit. Pt reports being worried about their diabetes, states they feel that they can not keep their blood sugar levels under control. Pt reports being hospitalized for 3 weeks with COVID last year and states that these problems with glycemic control started after getting COVID. Pt reports multiple symptoms of hypoglycemia (faintness, dizziness, confusion) a few times a month. Pt is checking BG when they feel symptomatic, usually sees BG values around 80. Pt will use a glucose gel or food to bring their sugar back up. Pt checks BG fasting, before and 2 hours after each meal. Pt reports following a low carb (~30g/day) keto diet.  Pt reports eating grains cause joint and muscle pain. Pt reports hypoglycemia when exercising for more than 5 minutes. Pt is afraid to exercise due to safety concerns.  Pt has history of hyperlipidemia (TC - 340, TGL - 298, LDL - 249, HDL - 29) and hypertension (BP - 168/90).  Height 5\' 6"  (1.676 m), weight (!) 323 lb 12.8 oz (146.9 kg), last menstrual period 01/06/2012. Body mass index is 52.26 kg/m.   Diabetes Self-Management Education - 06/19/21 1544       Visit Information   Visit Type First/Initial      Initial Visit   Diabetes Type Type 2    Are you currently following a meal plan? No    Are you taking your medications as prescribed? Yes    Date Diagnosed 07/2020      Health Coping   How would you rate your overall health? Fair      Psychosocial Assessment   Patient Belief/Attitude about Diabetes Defeat/Burnout   Pt struggles with high/low blood sugar swings   Self-care barriers None    Self-management support Doctor's office;Family    Other persons present  Patient;Spouse/SO    Patient Concerns Nutrition/Meal planning;Medication;Glycemic Control    Special Needs None    Preferred Learning Style No preference indicated    Learning Readiness Ready    How often do you need to have someone help you when you read instructions, pamphlets, or other written materials from your doctor or pharmacy? 1 - Never    What is the last grade level you completed in school? 12th grade      Pre-Education Assessment   Patient understands the diabetes disease and treatment process. Needs Instruction    Patient understands incorporating nutritional management into lifestyle. Needs Instruction    Patient undertands incorporating physical activity into lifestyle. Needs Instruction    Patient understands using medications safely. Needs Instruction    Patient understands monitoring blood glucose, interpreting and using results Needs Instruction    Patient understands prevention, detection, and treatment of acute complications. Needs Instruction    Patient understands prevention, detection, and treatment of chronic complications. Needs Instruction    Patient understands how to develop strategies to address psychosocial issues. Needs Instruction    Patient understands how to develop strategies to promote health/change behavior. Needs Instruction      Complications   Last HgB A1C per patient/outside source 5.8 %   03/05/2021   How often do you check your blood sugar? > 4 times/day    Fasting Blood glucose range (mg/dL) 03/07/2021    Postprandial  Blood glucose range (mg/dL) 161-096    Number of hypoglycemic episodes per month 1    Can you tell when your blood sugar is low? Yes    What do you do if your blood sugar is low? Eat some food, have a glucose gel    Number of hyperglycemic episodes per week 0    Have you had a dilated eye exam in the past 12 months? Yes    Have you had a dental exam in the past 12 months? No    Are you checking your feet? N/A      Dietary Intake    Breakfast 1 piece cinnamon raisin Ezekiel bread with butter, 2 scrambled eggs, 6 slices bacon    Lunch [employer] Sandwich, water    Dinner [employer] Hamburger Steak, side salad w/ ranch, steamed broccoli, water    Snack (evening) dry roasted peanuts.    Beverage(s) water w/ electrolytes      Exercise   Exercise Type ADL's;Light (walking / raking leaves)   Pt experiences hypoglycemia when exercising regualry.   How many days per week to you exercise? 3    How many minutes per day do you exercise? 5    Total minutes per week of exercise 15      Patient Education   Previous Diabetes Education No    Disease state  Definition of diabetes, type 1 and 2, and the diagnosis of diabetes;Factors that contribute to the development of diabetes;Explored patient's options for treatment of their diabetes    Nutrition management  Carbohydrate counting;Role of diet in the treatment of diabetes and the relationship between the three main macronutrients and blood glucose level;Reviewed blood glucose goals for pre and post meals and how to evaluate the patients' food intake on their blood glucose level.    Physical activity and exercise  Role of exercise on diabetes management, blood pressure control and cardiac health.;Identified with patient nutritional and/or medication changes necessary with exercise.    Medications Reviewed patients medication for diabetes, action, purpose, timing of dose and side effects.    Monitoring Purpose and frequency of SMBG.;Identified appropriate SMBG and/or A1C goals.    Acute complications Taught treatment of hypoglycemia - the 15 rule.    Chronic complications Relationship between chronic complications and blood glucose control    Psychosocial adjustment Role of stress on diabetes;Identified and addressed patients feelings and concerns about diabetes    Personal strategies to promote health Lifestyle issues that need to be addressed for better diabetes care       Individualized Goals (developed by patient)   Nutrition Follow meal plan discussed;Adjust meds/carbs with exercise as discussed    Physical Activity Exercise 3-5 times per week    Medications take my medication as prescribed    Monitoring  send in my blood glucose log as discussed;test my blood glucose as discussed    Reducing Risk treat hypoglycemia with 15 grams of carbs if blood glucose less than 70mg /dL      Post-Education Assessment   Patient understands the diabetes disease and treatment process. Needs Review    Patient understands incorporating nutritional management into lifestyle. Needs Review    Patient undertands incorporating physical activity into lifestyle. Needs Review    Patient understands using medications safely. Needs Review    Patient understands monitoring blood glucose, interpreting and using results Needs Review    Patient understands prevention, detection, and treatment of acute complications. Needs Review    Patient understands prevention, detection, and treatment  of chronic complications. Needs Review    Patient understands how to develop strategies to address psychosocial issues. Needs Review    Patient understands how to develop strategies to promote health/change behavior. Needs Review      Outcomes   Expected Outcomes Demonstrated interest in learning. Expect positive outcomes    Future DMSE 4-6 wks    Program Status Not Completed             Individualized Plan for Diabetes Self-Management Training:   Learning Objective:  Patient will have a greater understanding of diabetes self-management. Patient education plan is to attend individual and/or group sessions per assessed needs and concerns.   Plan:   Patient Instructions  Work towards eating three meals a day, about 5-6 hours apart!  Begin to recognize carbohydrates in your food choices!  Have 3 carb choices at each meal (45 g).   Begin to build your meals using the proportions of the  Balanced Plate. First, select your carb choice(s) for the meal, and determine how much you should have to equal 3 carb choices (45 g). Next, select your source of protein to pair with your carb choice(s). Finally, complete the remaining half of your meal with a variety of non-starchy vegetables.   Expected Outcomes:  Demonstrated interest in learning. Expect positive outcomes  Education material provided: Carbohydrate counting sheet, Eating Before Exercise  If problems or questions, patient to contact team via:  Phone and Email  Future DSME appointment: 4-6 wks

## 2021-06-26 ENCOUNTER — Encounter (HOSPITAL_BASED_OUTPATIENT_CLINIC_OR_DEPARTMENT_OTHER): Payer: Self-pay | Admitting: Family Medicine

## 2021-07-09 ENCOUNTER — Ambulatory Visit (HOSPITAL_BASED_OUTPATIENT_CLINIC_OR_DEPARTMENT_OTHER): Payer: 59 | Admitting: Family Medicine

## 2021-07-23 ENCOUNTER — Ambulatory Visit (HOSPITAL_BASED_OUTPATIENT_CLINIC_OR_DEPARTMENT_OTHER): Payer: 59 | Admitting: Cardiology

## 2021-08-12 ENCOUNTER — Ambulatory Visit: Payer: 59 | Admitting: Dietician

## 2021-08-13 ENCOUNTER — Ambulatory Visit (HOSPITAL_BASED_OUTPATIENT_CLINIC_OR_DEPARTMENT_OTHER): Payer: 59 | Admitting: Internal Medicine

## 2021-09-30 ENCOUNTER — Other Ambulatory Visit: Payer: Self-pay

## 2021-09-30 ENCOUNTER — Encounter (HOSPITAL_COMMUNITY): Payer: Self-pay | Admitting: Emergency Medicine

## 2021-09-30 ENCOUNTER — Emergency Department (HOSPITAL_COMMUNITY)
Admission: EM | Admit: 2021-09-30 | Discharge: 2021-09-30 | Disposition: A | Discharge status: Home / Self Care | Payer: Self-pay | Attending: Emergency Medicine | Admitting: Emergency Medicine

## 2021-09-30 ENCOUNTER — Emergency Department (HOSPITAL_COMMUNITY): Payer: Self-pay

## 2021-09-30 DIAGNOSIS — Z7984 Long term (current) use of oral hypoglycemic drugs: Secondary | ICD-10-CM | POA: Insufficient documentation

## 2021-09-30 DIAGNOSIS — F17210 Nicotine dependence, cigarettes, uncomplicated: Secondary | ICD-10-CM | POA: Insufficient documentation

## 2021-09-30 DIAGNOSIS — E119 Type 2 diabetes mellitus without complications: Secondary | ICD-10-CM | POA: Insufficient documentation

## 2021-09-30 DIAGNOSIS — I160 Hypertensive urgency: Secondary | ICD-10-CM | POA: Insufficient documentation

## 2021-09-30 DIAGNOSIS — R079 Chest pain, unspecified: Secondary | ICD-10-CM

## 2021-09-30 HISTORY — DX: Type 2 diabetes mellitus without complications: E11.9

## 2021-09-30 LAB — BASIC METABOLIC PANEL
Anion gap: 10 (ref 5–15)
BUN: 13 mg/dL (ref 6–20)
CO2: 25 mmol/L (ref 22–32)
Calcium: 9.3 mg/dL (ref 8.9–10.3)
Chloride: 104 mmol/L (ref 98–111)
Creatinine, Ser: 0.62 mg/dL (ref 0.44–1.00)
GFR, Estimated: >60 mL/min (ref >60)
Glucose, Bld: 110 mg/dL — ABNORMAL HIGH (ref 70–99)
Potassium: 4.1 mmol/L (ref 3.5–5.1)
Sodium: 139 mmol/L (ref 135–145)

## 2021-09-30 LAB — CBC
HCT: 42.2 % (ref 36.0–46.0)
Hemoglobin: 13.8 g/dL (ref 12.0–15.0)
MCH: 28.6 pg (ref 26.0–34.0)
MCHC: 32.7 g/dL (ref 30.0–36.0)
MCV: 87.4 fL (ref 80.0–100.0)
Platelets: 323 10*3/uL (ref 150–400)
RBC: 4.83 MIL/uL (ref 3.87–5.11)
RDW: 13.5 % (ref 11.5–15.5)
WBC: 10.9 10*3/uL — ABNORMAL HIGH (ref 4.0–10.5)
nRBC: 0 % (ref 0.0–0.2)

## 2021-09-30 LAB — TROPONIN I (HIGH SENSITIVITY): Troponin I (High Sensitivity): 14 ng/L (ref <18)

## 2021-09-30 MED ORDER — LISINOPRIL 10 MG PO TABS: 10.0000 mg | ORAL_TABLET | Freq: Every day | ORAL | 0 refills | Status: DC

## 2021-09-30 MED ORDER — LISINOPRIL 10 MG PO TABS
10.0000 mg | ORAL_TABLET | Freq: Once | ORAL | Status: AC
Start: 2021-09-30 — End: 2021-09-30
  Administered 2021-09-30: 10 mg via ORAL
  Filled 2021-09-30: qty 1

## 2021-09-30 NOTE — ED Provider Triage Note (Signed)
Emergency Medicine Provider Triage Evaluation Note  Penny Huerta , a 56 y.o. female  was evaluated in triage.  Pt complains of intermittent chest pain for the past several weeks.  Patient states her symptoms are worse after physical exertion, especially walking upstairs.  She had an episode yesterday where she walked up the stairs in her house" felt like she was going to die".  She states she does have a history of panic attacks, but this felt different.  She saw her doctor this morning, they did an EKG and recommended she follow-up in the emergency department.  Review of Systems  Positive: Chest pain, shortness of breath Negative: Fever, chills, cough  Physical Exam  BP (!) 166/99 (BP Location: Right Arm)    Pulse 86    Temp 98.9 F (37.2 C) (Oral)    Resp 18    LMP 01/06/2012    SpO2 95%  Gen:   Awake, no distress   Resp:  Normal effort  MSK:   Moves extremities without difficulty  Other:    Medical Decision Making  Medically screening exam initiated at 12:12 PM.  Appropriate orders placed.  Penny Huerta was informed that the remainder of the evaluation will be completed by another provider, this initial triage assessment does not replace that evaluation, and the importance of remaining in the ED until their evaluation is complete.     Penny Deleeuw T, PA-C 09/30/21 1217

## 2021-09-30 NOTE — Discharge Instructions (Addendum)
I have sent a referral to our cardiology specialists so that they will get you in sooner! Need stress test for chest pain at rest/exertion and echocardiogram for known heart murmur.

## 2021-09-30 NOTE — ED Provider Notes (Signed)
[facility] EMERGENCY DEPARTMENT Provider Note   CSN: [id number] Arrival date & time: 09/30/21  1136     History  Chief Complaint  Patient presents with   Chest Pain   Nausea    Penny Huerta is a 56 y.o. female.  Pt is a 56 yo female with pmh of hyperlipidemia, DM, obesity, and previous 20 pack year smoking hx presenting for chest pain. Patient admits to sternal chest pain that radiates to bilateral upper extremity at rest and with exertion x 2 weeks. Pt denies sob or lower leg swelling. Denies hx of DVT/PE, calf pain/swelling, hormone use, recent surgeries, or recent immobilization. Denies association with eating.  The history is provided by the patient. No language interpreter was used.  Chest Pain Associated symptoms: no abdominal pain, no back pain, no cough, no fever, no palpitations, no shortness of breath and no vomiting       Home Medications Prior to Admission medications   Medication Sig Start Date End Date Taking? Authorizing Provider  B Complex Vitamins (VITAMIN B COMPLEX PO) Take by mouth daily.    [provider]  Coenzyme Q10 (CO Q-10) 100 MG CAPS Take by mouth daily.    [provider]  Continuous Blood Gluc Receiver (DEXCOM G6 RECEIVER) DEVI 1 Device by Does not apply route continuous. 05/28/21   de [place], Raymond J, MD  Continuous Blood Gluc Sensor (DEXCOM G6 SENSOR) MISC 3 Devices by Does not apply route continuous. 05/28/21   de [place], Blondell Reveal, MD  Continuous Blood Gluc Transmit (DEXCOM G6 TRANSMITTER) MISC 1 Device by Does not apply route continuous. 05/28/21   de [place], Raymond J, MD  fluticasone [facility]) 50 MCG/ACT nasal spray Place into both nostrils daily.    [provider]  ibuprofen (ADVIL) 200 MG tablet Take 800 mg by mouth every 6 (six) hours as needed for fever, headache or mild pain.    [provider]  metFORMIN (GLUCOPHAGE) 500 MG tablet Take 1 tablet (500 mg total) by mouth 2 (two) times daily  with a meal. 12/16/20   Hilts, Legrand Como, MD  Multiple Vitamin (MULTIVITAMIN) tablet Take 1 tablet by mouth daily.    [provider]      Allergies    Alprazolam, Citalopram hydrobromide, Nitrofurantoin monohyd macro, Statins, and Sulfa drugs cross reactors    Review of Systems   Review of Systems  Constitutional:  Negative for chills and fever.  HENT:  Negative for ear pain and sore throat.   Eyes:  Negative for pain and visual disturbance.  Respiratory:  Negative for cough and shortness of breath.   Cardiovascular:  Positive for chest pain. Negative for palpitations.  Gastrointestinal:  Negative for abdominal pain and vomiting.  Genitourinary:  Negative for dysuria and hematuria.  Musculoskeletal:  Negative for arthralgias and back pain.  Skin:  Negative for color change and rash.  Neurological:  Negative for seizures and syncope.  All other systems reviewed and are negative.  Physical Exam Updated Vital Signs BP (!) 175/83    Pulse 89    Temp 98.8 F (37.1 C) (Oral)    Resp 16    LMP 01/06/2012    SpO2 97%  Physical Exam Vitals and nursing note reviewed.  Constitutional:      General: She is not in acute distress.    Appearance: She is well-developed.  HENT:     Head: Normocephalic and atraumatic.  Eyes:     Conjunctiva/sclera: Conjunctivae normal.  Cardiovascular:  Rate and Rhythm: Normal rate and regular rhythm.     Pulses:          Radial pulses are 2+ on the right side and 2+ on the left side.       Dorsalis pedis pulses are 2+ on the right side and 2+ on the left side.     Heart sounds: Murmur heard.  Pulmonary:     Effort: Pulmonary effort is normal. No respiratory distress.     Breath sounds: Normal breath sounds.  Abdominal:     Palpations: Abdomen is soft.     Tenderness: There is no abdominal tenderness.  Musculoskeletal:        General: No swelling.     Cervical back: Neck supple.     Right lower leg: No edema.     Left lower leg: No edema.   Skin:    General: Skin is warm and dry.     Capillary Refill: Capillary refill takes less than 2 seconds.  Neurological:     Mental Status: She is alert.  Psychiatric:        Mood and Affect: Mood normal.    ED Results / Procedures / Treatments   Labs (all labs ordered are listed, but only abnormal results are displayed) Labs Reviewed  BASIC METABOLIC PANEL - Abnormal; Notable for the following components:      Result Value   Glucose, Bld 110 (*)    All other components within normal limits  CBC - Abnormal; Notable for the following components:   WBC 10.9 (*)    All other components within normal limits  I-STAT BETA HCG BLOOD, ED ([facility], [facility], [facility] ONLY)  TROPONIN I (HIGH SENSITIVITY)  TROPONIN I (HIGH SENSITIVITY)    EKG EKG Interpretation  Date/Time:  Tuesday September 30 2021 11:31:37 EST Ventricular Rate:  90 PR Interval:  128 QRS Duration: 86 QT Interval:  360 QTC Calculation: 440 R Axis:   -30 Text Interpretation: Normal sinus rhythm Left axis deviation Anterior infarct , age undetermined Abnormal ECG When compared with ECG of 26-Jul-2020 14:35, PREVIOUS ECG IS PRESENT Confirmed by Campbell Stall ([id number]) on [id number] 3:11:56 PM  Radiology DG Chest 2 View  Result Date: 09/30/2021 CLINICAL DATA:  Chest pain, shortness of breath, nausea and weakness for few days. EXAM: CHEST - 2 VIEW COMPARISON:  Chest radiograph dated July 26, 2020 FINDINGS: The heart size and mediastinal contours are within normal limits. Prominence of the central pulmonary vessels. No focal consolidation or pleural effusion. Both lungs are clear. The visualized skeletal structures are unremarkable. IMPRESSION: No active cardiopulmonary disease. Electronically Signed   By: Keane Police D.O.   On: 09/30/2021 12:56    Procedures Procedures    Medications Ordered in ED Medications - No data to display  ED Course/ Medical Decision Making/ A&P                           Medical Decision  Making Risk Prescription drug management.   3:29 PM 56 yo female with pmh of hyperlipidemia, obesity, and previous 20 pack year smoking hx presenting for chest pain. Pt is Aox3, no acute distress, afebrile, with stable vitals. Physical exam demonstrates heart murmur-known to pt but unaware of what valve. Rec f/u with cardiology for echocardiogram for complete diagnosis. No signs of acute heart failure.   The patient's chest pain is not suggestive of pulmonary embolus, cardiac ischemia, aortic dissection, pericarditis, myocarditis, pulmonary embolism, pneumothorax, pneumonia,  Zoster, or esophageal perforation, or other serious etiology.  Historically not abrupt in onset, tearing or ripping, pulses symmetric. EKG nonspecific for ischemia/infarction. No dysrhythmias, brugada, WPW, prolonged QT noted. CXR reviewed and WNL. Troponin negative. Labs without demonstration of acute pathology unless otherwise noted above. Moderate HEART Score: 4   Pt also demonstrates hypertensive urgency without end organ failure that may also be contributing to chest pain. Lisinopril started for out patient use.   Detailed discussions were had with the patient regarding current findings, and need for close f/u with cardiologist this week for stress test and echocardiogram. Ambulatory referral placed. The patient has been instructed to return immediately if the symptoms worsen in any way for re-evaluation. Patient verbalized understanding and is in agreement with current care plan. All questions answered prior to discharge.         Final Clinical Impression(s) / ED Diagnoses Final diagnoses:  Chest pain, unspecified type  Hypertensive urgency    Rx / DC Orders ED Discharge Orders     None         Lianne Cure, DO [id number]

## 2021-09-30 NOTE — ED Triage Notes (Addendum)
Pt. Stated, I started having chest pain for 2 weeks and now its to the point were I get nausea with the chest pain. Went to my Dr. And sent me here for further evaluation

## 2021-10-01 NOTE — Progress Notes (Signed)
Cardiology Office Note:    Date:  10/02/2021   ID:  AFAF WARNS, DOB [birth year], MRN [id number]  PCP:  Katherina Mires, MD   [facility] Providers Cardiologist:  Lenna Sciara, MD Referring MD: Lianne Cure, DO   Chief Complaint/Reason for Referral: Chest pain 1}   ASSESSMENT:    Chest pain, unspecified type  Type 2 diabetes mellitus without complication, without long-term current use of insulin ([place])  Primary hypertension  Morbid obesity with BMI of 50.0-59.9, adult ([place])  Chronic diastolic heart failure ([place])  Familial hyperlipidemia    PLAN:    In order of problems listed above:  1.  There are some aspects to her pain that are atypical and some more concerning.  I will start Imdur 30 mg at night and as needed nitroglycerin as well as aspirin 81 mg.  We will also start metoprolol 25 mg twice daily.  She tells me that her pain has been better now that her blood pressure is better controlled.  We will obtain a coronary CTA and echocardiogram to evaluate further.  If the patient has mild obstructive coronary artery disease, she will require a statin (with goal LDL < 70) and aspirin, if she have high-grade disease we will need to consider optimal medical therapy and if symptoms are refractory to medical therapy, then a cardiac catheterization with possible PCI will be pursued to alleviate symptoms.  If they have high risk disease we will proceed directly to cardiac catheterization.  We will reach out to her in a few weeks regarding her symptoms.  Follow up in 6 months.   2.  Start Jardiance and aspirin due to history of diabetes.    3.  Continue lisinopril.  4.  Regarding her obesity, will refer to pharmacy for recommendations regarding any pharmaceutical therapies.  5.  Start Jardiance 10mg  qday.  6.  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  Given her history of FH I will refer her to Dr. Debara Pickett for further recommendations.               Dispo:  No follow-ups on file.     Medication Adjustments/Labs and Tests Ordered: Current medicines are reviewed at length with the patient today.  Concerns regarding medicines are outlined above.   Tests Ordered: No orders of the defined types were placed in this encounter.   Medication Changes: No orders of the defined types were placed in this encounter.   History of Present Illness:    The patient is a 56 y.o. female with the indicated medical history here for chest pain.  Patient was seen two days ago in the emergency department due to chest pain.  Her EKG and laboratories were unremarkable.  She tells me that she has had exertional chest pain with some atypical features over the last few months.  She tells me last Sunday she was walking up stairs and developed chest pain.  She rested and it went away.  She then went to work and it occurred again but then it lasted almost all day while she was sitting down.  She saw her primary care provider and was directed to the emergency department and had a relatively benign work-up.  She was started on lisinopril because of high blood pressure in the emergency department and her pain has improved.  Now is at about a 2-3 out of 10.  She has not done much while at home.  She tells me at times if  she is in a certain position the pain will be worse.  She denies any presyncope, syncope, palpitations, paroxysmal nocturnal dyspnea, or peripheral edema.  She has not laid flat since all this happened because getting up out of bed exerts herself so much that she is afraid to get chest pain.  She has been sleeping upright for the most part.       Previous Medical History: Past Medical History:  Diagnosis Date   Allergy    Anxiety    Atrial fibrillation ([place])    Diabetes mellitus without complication (HCC)    Heart murmur    ARRHYTHMIA   Hyperlipidemia      Current Medications: Current Meds  Medication Sig   Continuous Blood Gluc Receiver  (DEXCOM G6 RECEIVER) DEVI 1 Device by Does not apply route continuous.   Continuous Blood Gluc Sensor (DEXCOM G6 SENSOR) MISC 3 Devices by Does not apply route continuous.   Continuous Blood Gluc Transmit (DEXCOM G6 TRANSMITTER) MISC 1 Device by Does not apply route continuous.   fluticasone (FLONASE) 50 MCG/ACT nasal spray Place into both nostrils daily.   ibuprofen (ADVIL) 200 MG tablet Take 800 mg by mouth every 6 (six) hours as needed for fever, headache or mild pain.   lisinopril (ZESTRIL) 10 MG tablet Take 1 tablet (10 mg total) by mouth daily.   metFORMIN (GLUCOPHAGE) 500 MG tablet Take 1 tablet (500 mg total) by mouth 2 (two) times daily with a meal.     Allergies:    Alprazolam, Citalopram hydrobromide, Nitrofurantoin monohyd macro, Statins, and Sulfa drugs cross reactors   Social History:   Social History   Tobacco Use   Smoking status: Former    Years: 20.00    Types: Cigarettes    Quit date: 09/14/1997    Years since quitting: 24.0   Smokeless tobacco: Never  Substance Use Topics   Alcohol use: No   Drug use: No     Family Hx: Family History  Problem Relation Age of Onset   Hyperlipidemia Mother    Transient ischemic attack Mother    Hypertension Mother    Diabetes Mother    Transient ischemic attack Father    Ovarian cancer Paternal Grandmother    Hyperlipidemia Maternal Grandfather    Heart disease Maternal Grandfather    Hyperlipidemia Maternal Grandmother    Heart disease Maternal Grandmother    Stroke Paternal Grandfather    Hypertension Paternal Grandfather    Diabetes Paternal Grandfather    Sudden death Maternal Uncle      Review of Systems:   Please see the history of present illness.    All other systems reviewed and are negative.     EKGs/Labs/Other Test Reviewed:    EKG: Normal sinus rhythm possible anterior infarct pattern  Prior CV studies:  November 2021 echocardiogram 1. Left ventricular ejection fraction, by estimation, is 55 to  60%. The  left ventricle has normal function. The left ventricle has no regional  wall motion abnormalities. Left ventricular diastolic parameters are  consistent with Grade I diastolic  dysfunction (impaired relaxation).   2. Right ventricular systolic function is normal. The right ventricular  size is normal.   3. The mitral valve is normal in structure. No evidence of mitral valve  regurgitation. No evidence of mitral stenosis.   4. The aortic valve has an indeterminant number of cusps. Aortic valve  regurgitation is not visualized. No aortic stenosis is present.   5. The inferior vena cava is dilated in  size with >50% respiratory  variability, suggesting right atrial pressure of 8 mmHg.   Imaging studies that I have independently reviewed today: Chest x-ray and echocardiogram  Recent Labs: 11/25/2020: ALT 25; TSH 1.61 09/30/2021: BUN 13; Creatinine, Ser 0.62; Hemoglobin 13.8; Platelets 323; Potassium 4.1; Sodium 139   Recent Lipid Panel Lab Results  Component Value Date/Time   CHOL 340 (H) 03/05/2021 10:33 AM   TRIG 298 (H) 03/05/2021 10:33 AM   HDL 26 (L) 03/05/2021 10:33 AM   LDLCALC 249 (H) 03/05/2021 10:33 AM   LDLDIRECT 214.4 03/10/2013 11:09 AM    Risk Assessment/Calculations:          Physical Exam:    VS:  BP (!) 142/84    Pulse 91    Ht 5\' 6"  (1.676 m)    Wt (!) 328 lb (148.8 kg)    LMP 01/06/2012    SpO2 97%    BMI 52.94 kg/m    Wt Readings from Last 3 Encounters:  10/02/21 (!) 328 lb (148.8 kg)  06/19/21 (!) 323 lb 12.8 oz (146.9 kg)  05/28/21 (!) 321 lb (145.6 kg)    GENERAL:  No apparent distress, AOx3, obese HEENT:  No carotid bruits, +2 carotid impulses, no scleral icterus; +xanthelasmas CAR: RRR no murmurs, gallops, rubs, or thrills RES:  Clear to auscultation bilaterally ABD:  Soft, nontender, nondistended, positive bowel sounds x 4 VASC:  +2 radial pulses, +2 carotid pulses, palpable pedal pulses NEURO:  CN 2-12 grossly intact; motor and sensory  grossly intact PSYCH:  No active depression or anxiety EXT:  No edema, ecchymosis, or cyanosis  Signed, Early Osmond, MD  10/02/2021 10:35 AM    [address], [place], [place]  [address] Phone: [phone]; Fax: [phone]   Note:  This document was prepared using Dragon voice recognition software and may include unintentional dictation errors.

## 2021-10-02 ENCOUNTER — Encounter: Payer: Self-pay | Admitting: Internal Medicine

## 2021-10-02 ENCOUNTER — Other Ambulatory Visit: Payer: Self-pay

## 2021-10-02 ENCOUNTER — Ambulatory Visit (INDEPENDENT_AMBULATORY_CARE_PROVIDER_SITE_OTHER): Payer: Self-pay | Admitting: Internal Medicine

## 2021-10-02 DIAGNOSIS — E785 Hyperlipidemia, unspecified: Secondary | ICD-10-CM

## 2021-10-02 DIAGNOSIS — E7849 Other hyperlipidemia: Secondary | ICD-10-CM

## 2021-10-02 DIAGNOSIS — I10 Essential (primary) hypertension: Secondary | ICD-10-CM

## 2021-10-02 DIAGNOSIS — R079 Chest pain, unspecified: Secondary | ICD-10-CM

## 2021-10-02 DIAGNOSIS — E119 Type 2 diabetes mellitus without complications: Secondary | ICD-10-CM

## 2021-10-02 DIAGNOSIS — I5032 Chronic diastolic (congestive) heart failure: Secondary | ICD-10-CM

## 2021-10-02 DIAGNOSIS — Z6841 Body Mass Index (BMI) 40.0 and over, adult: Secondary | ICD-10-CM

## 2021-10-02 LAB — BASIC METABOLIC PANEL
BUN/Creatinine Ratio: 27 — ABNORMAL HIGH (ref 9–23)
BUN: 17 mg/dL (ref 6–24)
CO2: 24 mmol/L (ref 20–29)
Calcium: 9.4 mg/dL (ref 8.7–10.2)
Chloride: 104 mmol/L (ref 96–106)
Creatinine, Ser: 0.63 mg/dL (ref 0.57–1.00)
Glucose: 107 mg/dL — ABNORMAL HIGH (ref 70–99)
Potassium: 4.7 mmol/L (ref 3.5–5.2)
Sodium: 141 mmol/L (ref 134–144)
eGFR: 105 mL/min/{1.73_m2} (ref >59)

## 2021-10-02 MED ORDER — METOPROLOL TARTRATE 100 MG PO TABS: ORAL_TABLET | ORAL | 0 refills | Status: DC

## 2021-10-02 MED ORDER — NITROGLYCERIN 0.4 MG SL SUBL: 0.4000 mg | SUBLINGUAL_TABLET | SUBLINGUAL | 3 refills | Status: DC | PRN

## 2021-10-02 MED ORDER — PANTOPRAZOLE SODIUM 40 MG PO TBEC: 40.0000 mg | DELAYED_RELEASE_TABLET | Freq: Every day | ORAL | 3 refills | Status: DC

## 2021-10-02 MED ORDER — EMPAGLIFLOZIN 10 MG PO TABS: 10.0000 mg | ORAL_TABLET | Freq: Every day | ORAL | 3 refills | Status: DC

## 2021-10-02 MED ORDER — METOPROLOL TARTRATE 25 MG PO TABS: 25.0000 mg | ORAL_TABLET | Freq: Two times a day (BID) | ORAL | 3 refills | Status: DC

## 2021-10-02 MED ORDER — ISOSORBIDE MONONITRATE ER 30 MG PO TB24: 30.0000 mg | ORAL_TABLET | Freq: Every day | ORAL | 3 refills | Status: DC

## 2021-10-02 NOTE — Patient Instructions (Addendum)
Medication Instructions:  1) Start Jardiance 10 mg daily   2) Start Metoprolol 25 mg twice a day   3) Start Protonix 40 mg daily   4) Start Nitroglycerin 0.4 mg every 5 minutes as needed   5) Start Aspirin 81 mg daily   6) Start Isosorbide 30 mg daily    *If you need a refill on your cardiac medications before your next appointment, please call your pharmacy*   Lab Work: Bmp - Today   If you have labs (blood work) drawn today and your tests are completely normal, you will receive your results only by: MyChart Message (if you have MyChart) OR A paper copy in the mail If you have any lab test that is abnormal or we need to change your treatment, we will call you to review the results.   Testing/Procedures: Your physician has requested that you have an echocardiogram. Echocardiography is a painless test that uses sound waves to create images of your heart. It provides your doctor with information about the size and shape of your heart and how well your hearts chambers and valves are working. This procedure takes approximately one hour. There are no restrictions for this procedure.  Your physician has recommended that you have a coronary CT. Please refer to instructions given.  Your physician has referred you to see Dr. Zoila Shutter    Follow-Up: At [facility], you and your health needs are our priority.  As part of our continuing mission to provide you with exceptional heart care, we have created designated Provider Care Teams.  These Care Teams include your primary Cardiologist (physician) and Advanced Practice Providers (APPs -  Physician Assistants and Nurse Practitioners) who all work together to provide you with the care you need, when you need it.  We recommend signing up for the patient portal called "MyChart".  Sign up information is provided on this After Visit Summary.  MyChart is used to connect with patients for Virtual Visits (Telemedicine).  Patients are able to  view lab/test results, encounter notes, upcoming appointments, etc.  Non-urgent messages can be sent to your provider as well.   To learn more about what you can do with MyChart, go to [email or web address].    Your next appointment:   6 month(s)  The format for your next appointment:   In Person  Provider:   Dr. Alverda Skeans    Other Instructions Call our office in 2 weeks to let us know how your symptoms are doing      Your cardiac CT will be scheduled at one of the below locations:   [facility] [address] [place], [place] [address] [phone]    If scheduled at [facility], please arrive at the [facility] main entrance (entrance A) of [facility] 30 minutes prior to test start time. You can use the FREE valet parking offered at the main entrance (encouraged to control the heart rate for the test) Proceed to the [facility] Radiology Department (first floor) to check-in and test prep.   Please follow these instructions carefully (unless otherwise directed):    On the Night Before the Test: Be sure to Drink plenty of water. Do not consume any caffeinated/decaffeinated beverages or chocolate 12 hours prior to your test. Do not take any antihistamines 12 hours prior to your test.   On the Day of the Test: Drink plenty of water until 1 hour prior to the test. Do not eat any food 4  hours prior to the test. You may take your regular medications prior to the test.  Take metoprolol (Lopressor) two hours prior to test THE 100 MG DOSE FEMALES- please wear underwire-free bra if available, avoid dresses & tight clothing       After the Test: Drink plenty of water. After receiving IV contrast, you may experience a mild flushed feeling. This is normal. On occasion, you may experience a mild rash up to 24 hours after the test. This is not dangerous. If this occurs, you can take Benadryl 25 mg and increase your fluid intake. If you  experience trouble breathing, this can be serious. If it is severe call 911 IMMEDIATELY. If it is mild, please call our office. If you take any of these medications: Glipizide/Metformin, Avandament, Glucavance, please do not take 48 hours after completing test unless otherwise instructed.  We will call to schedule your test 2-4 weeks out understanding that some insurance companies will need an authorization prior to the service being performed.   For non-scheduling related questions, please contact the cardiac imaging nurse navigator should you have any questions/concerns: Rockwell Alexandria, Cardiac Imaging Nurse Navigator Larey Brick, Cardiac Imaging Nurse Navigator [facility] Direct Office Dial: [phone]   For scheduling needs, including cancellations and rescheduling, please call [place], [phone].

## 2021-10-03 ENCOUNTER — Telehealth: Payer: Self-pay | Admitting: Internal Medicine

## 2021-10-03 NOTE — Telephone Encounter (Signed)
°  Per MyChart scheduling request message:  Good morning! I wanted to let you that since we are self-pay right now we did not fill the Jardiance prescription-it was $2000.00. If there is something else suitable, we could take a substitute, but if [place] Pepper is whats really needed we can try again in March when my husbands insurance begins.

## 2021-10-03 NOTE — Telephone Encounter (Signed)
**Note De-Identified Penny Huerta Obfuscation** The pt and I discussed her applying for pt asst for her Jardiance through the [employer].  She is interested in applying so I gave her their number and advised her to call them with questions about their Jardiance program and her eligibility to be approved for the program.  She is aware to ask them to mail her a application to her home if it appears that she is eligible for approval into the program and that once she gets it to complete her part of the application, obtain required documents per [facility], and to bring all to Dr Debby Bud office at [facility] in [place] to drop off in the front office and that we will take care of the providers page and will fax all to [facility].  She verbalized understanding and thanked me for calling her with this information.

## 2021-10-09 ENCOUNTER — Telehealth: Payer: Self-pay | Admitting: Internal Medicine

## 2021-10-09 NOTE — Telephone Encounter (Signed)
Pt states she woke up with CP around 3am.  Hasn't had CP since starting the Imdur.  States she's pretty sure she forgot to take her Imdur last night.  Took a nitro and the pain resolved.  CP recurs when she gets up and moves around a lot.  Was slightly SOB with CP but denies any other episodes.  Had 3-4 episodes of diarrhea prior to the Nitro.  Took BP while on the phone with me and it was 183/98.  Usually runs 135/65-80.  Takes Metoprolol at 10am and sometime between 6:15-8pm.  Explained the need to take the Metoprolol a little closer to 12 hours apart.  Denies CP at this time.  Spoke with Dr. Lynnette Caffey and he said to have pt increase her Imdur to 60mg  once daily.  Spoke with pt and made her aware.  She is agreeable.  Advised if CP recurs, please let know.  Inquired why Coronary CT was delayed until March.  Pt states that insurance does not start until March 1st.  Pt is aware to call if CP continues to be an issue and when appropriate to go to ED.  Pt appreciative for call

## 2021-10-09 NOTE — Telephone Encounter (Signed)
Pt c/o of Chest Pain: STAT if CP now or developed within 24 hours  1. Are you having CP right now? yes  2. Are you experiencing any other symptoms (ex. SOB, nausea, vomiting, sweating)? No, feels cold and has diarrhea.  3. How long have you been experiencing CP? Since about 3am this morning  4. Is your CP continuous or coming and going? continuous  5. Have you taken Nitroglycerin? Yes, once.   Patient is not sure if she took her isosorbide last night. She is wondering if she can take two today.  ?

## 2021-10-14 ENCOUNTER — Other Ambulatory Visit: Payer: Self-pay

## 2021-10-14 ENCOUNTER — Ambulatory Visit (HOSPITAL_COMMUNITY): Payer: Self-pay | Attending: Cardiology

## 2021-10-14 DIAGNOSIS — I5032 Chronic diastolic (congestive) heart failure: Secondary | ICD-10-CM | POA: Insufficient documentation

## 2021-10-14 LAB — ECHOCARDIOGRAM COMPLETE
AR max vel: 1.35 cm2
AV Area VTI: 1.54 cm2
AV Area mean vel: 1.47 cm2
AV Mean grad: 17 mmHg
AV Peak grad: 33.2 mmHg
Ao pk vel: 2.88 m/s
Area-P 1/2: 3.01 cm2
S' Lateral: 3 cm

## 2021-10-14 NOTE — Progress Notes (Signed)
Let patient know echo shows normal heart function without significant valvular issues. °

## 2021-10-20 ENCOUNTER — Telehealth: Payer: Self-pay | Admitting: Internal Medicine

## 2021-10-20 MED ORDER — LISINOPRIL 10 MG PO TABS: 10.0000 mg | ORAL_TABLET | Freq: Every day | ORAL | 3 refills | Status: DC

## 2021-10-20 NOTE — Telephone Encounter (Signed)
Pt sent this refill request Via Mychart to sching pool:      1. Which medications need to be refilled? (please list name of each medication and dose if known) lisinopril (ZESTRIL) 10 MG tablet [[id number]]   2. Which pharmacy/location (including street and city if local pharmacy) is medication to be sent to? [id number] [place], [place] - [phone] AT [facility] OF [facility] [address] Phone:  [phone]  Fax:  [phone]      3. Do they need a 30 day or 90 day supply? 90

## 2021-10-20 NOTE — Telephone Encounter (Signed)
Pt's medication was sent to pt's pharmacy as requested. Confirmation received.  °

## 2021-11-21 ENCOUNTER — Telehealth (HOSPITAL_COMMUNITY): Payer: Self-pay | Admitting: Emergency Medicine

## 2021-11-21 NOTE — Telephone Encounter (Signed)
Reaching out to patient to offer assistance regarding upcoming cardiac imaging study; pt verbalizes understanding of appt date/time, parking situation and where to check in, pre-test NPO status and medications ordered, and verified current allergies; name and call back number provided for further questions should they arise ?Rockwell Alexandria RN Navigator Cardiac Imaging ?[facility] ?[phone] office ?[phone] cell ? ?Pt reporting resting HRs 70-80s ?100mg  metoprolol tartrate ?Arrival 930 ? ?

## 2021-11-25 ENCOUNTER — Ambulatory Visit (HOSPITAL_COMMUNITY)
Admission: RE | Admit: 2021-11-25 | Discharge: 2021-11-25 | Disposition: A | Discharge status: Home / Self Care | Payer: BC Managed Care – PPO | Source: Ambulatory Visit | Attending: Internal Medicine | Admitting: Internal Medicine

## 2021-11-25 ENCOUNTER — Ambulatory Visit (HOSPITAL_COMMUNITY)
Admission: RE | Admit: 2021-11-25 | Discharge: 2021-11-25 | Disposition: A | Discharge status: Home / Self Care | Payer: BC Managed Care – PPO | Source: Ambulatory Visit | Attending: Cardiology | Admitting: Cardiology

## 2021-11-25 ENCOUNTER — Other Ambulatory Visit: Payer: Self-pay

## 2021-11-25 ENCOUNTER — Other Ambulatory Visit: Payer: Self-pay | Admitting: Cardiology

## 2021-11-25 DIAGNOSIS — R931 Abnormal findings on diagnostic imaging of heart and coronary circulation: Secondary | ICD-10-CM

## 2021-11-25 DIAGNOSIS — I2510 Atherosclerotic heart disease of native coronary artery without angina pectoris: Secondary | ICD-10-CM

## 2021-11-25 DIAGNOSIS — R079 Chest pain, unspecified: Secondary | ICD-10-CM | POA: Insufficient documentation

## 2021-11-25 MED ORDER — IOHEXOL 350 MG/ML SOLN
95.0000 mL | Freq: Once | INTRAVENOUS | Status: AC | PRN
  Administered 2021-11-25: 95 mL via INTRAVENOUS

## 2021-11-25 MED ORDER — METOPROLOL TARTRATE 5 MG/5ML IV SOLN
INTRAVENOUS | Status: AC
  Administered 2021-11-25: 10 mg via INTRAVENOUS
  Filled 2021-11-25: qty 10

## 2021-11-25 MED ORDER — NITROGLYCERIN 0.4 MG SL SUBL
0.8000 mg | SUBLINGUAL_TABLET | Freq: Once | SUBLINGUAL | Status: AC
  Administered 2021-11-25: 0.8 mg via SUBLINGUAL

## 2021-11-25 MED ORDER — METOPROLOL TARTRATE 5 MG/5ML IV SOLN
10.0000 mg | Freq: Once | INTRAVENOUS | Status: AC
Start: 2021-11-25 — End: 2021-11-25

## 2021-11-25 MED ORDER — NITROGLYCERIN 0.4 MG SL SUBL
SUBLINGUAL_TABLET | SUBLINGUAL | Status: AC
  Filled 2021-11-25: qty 2

## 2021-11-27 ENCOUNTER — Other Ambulatory Visit: Payer: Self-pay | Admitting: Internal Medicine

## 2021-11-27 DIAGNOSIS — I209 Angina pectoris, unspecified: Secondary | ICD-10-CM

## 2021-11-28 ENCOUNTER — Telehealth: Payer: Self-pay

## 2021-11-28 DIAGNOSIS — I209 Angina pectoris, unspecified: Secondary | ICD-10-CM

## 2021-11-28 NOTE — Telephone Encounter (Signed)
Spoke with the patient and scheduled her for a heart cath on 3/24 with Dr. Lynnette Caffey.  ?

## 2021-11-28 NOTE — Telephone Encounter (Signed)
-----   Message from Early Osmond, MD sent at 11/27/2021  4:52 PM EDT ----- ?Please set up for cor angio re: chest pain despite 2 AAs ?

## 2021-12-02 ENCOUNTER — Other Ambulatory Visit: Payer: BC Managed Care – PPO | Admitting: *Deleted

## 2021-12-02 ENCOUNTER — Other Ambulatory Visit: Payer: Self-pay

## 2021-12-02 DIAGNOSIS — I209 Angina pectoris, unspecified: Secondary | ICD-10-CM

## 2021-12-02 LAB — CBC
Hematocrit: 38.3 % (ref 34.0–46.6)
Hemoglobin: 12.8 g/dL (ref 11.1–15.9)
MCH: 28.4 pg (ref 26.6–33.0)
MCHC: 33.4 g/dL (ref 31.5–35.7)
MCV: 85 fL (ref 79–97)
Platelets: 341 10*3/uL (ref 150–450)
RBC: 4.51 x10E6/uL (ref 3.77–5.28)
RDW: 13.9 % (ref 11.7–15.4)
WBC: 10.6 10*3/uL (ref 3.4–10.8)

## 2021-12-02 LAB — BASIC METABOLIC PANEL
BUN/Creatinine Ratio: 27 — ABNORMAL HIGH (ref 9–23)
BUN: 20 mg/dL (ref 6–24)
CO2: 23 mmol/L (ref 20–29)
Calcium: 9.8 mg/dL (ref 8.7–10.2)
Chloride: 104 mmol/L (ref 96–106)
Creatinine, Ser: 0.74 mg/dL (ref 0.57–1.00)
Glucose: 113 mg/dL — ABNORMAL HIGH (ref 70–99)
Potassium: 4.3 mmol/L (ref 3.5–5.2)
Sodium: 139 mmol/L (ref 134–144)
eGFR: 96 mL/min/{1.73_m2} (ref >59)

## 2021-12-04 ENCOUNTER — Telehealth: Payer: Self-pay | Admitting: *Deleted

## 2021-12-04 NOTE — Telephone Encounter (Signed)
Cardiac Catheterization scheduled at [facility] for: Friday December 05, 2021 7:30 AM ?Arrival time and place: [place] Entrance A at: 5:30 AM ? ? ?No solid food after midnight prior to cath, clear liquids until 5 AM day of procedure. ? ?Medication instructions: ?-Hold: ? Metformin-day of procedure and 48 hours post procedure ? Jardiance -AM of procedure ?-Except hold medications usual morning medications can be taken with sips of water including aspirin 81 mg. ? ?Confirmed patient has responsible adult to drive home post procedure and be with patient first 24 hours after arriving home. ? ?Patient reports no new symptoms concerning for COVID-19/no exposure to COVID-19 in the past 10 days. ? ?Reviewed procedure instructions with patient. ?

## 2021-12-05 ENCOUNTER — Inpatient Hospital Stay (HOSPITAL_COMMUNITY)
Admission: RE | Admit: 2021-12-05 | Discharge: 2021-12-06 | DRG: 069 | Discharge status: Against Medical Advice | Payer: BC Managed Care – PPO | Source: Ambulatory Visit | Attending: Family Medicine | Admitting: Family Medicine

## 2021-12-05 ENCOUNTER — Observation Stay (HOSPITAL_COMMUNITY): Payer: BC Managed Care – PPO

## 2021-12-05 ENCOUNTER — Encounter (HOSPITAL_COMMUNITY): Payer: Self-pay | Admitting: Internal Medicine

## 2021-12-05 ENCOUNTER — Other Ambulatory Visit: Payer: Self-pay

## 2021-12-05 ENCOUNTER — Inpatient Hospital Stay (HOSPITAL_COMMUNITY)
Admission: RE | Discharge status: Against Medical Advice | Payer: Self-pay | Source: Ambulatory Visit | Attending: Internal Medicine

## 2021-12-05 ENCOUNTER — Ambulatory Visit (HOSPITAL_COMMUNITY): Payer: BC Managed Care – PPO

## 2021-12-05 DIAGNOSIS — I6603 Occlusion and stenosis of bilateral middle cerebral arteries: Secondary | ICD-10-CM | POA: Diagnosis not present

## 2021-12-05 DIAGNOSIS — I480 Paroxysmal atrial fibrillation: Secondary | ICD-10-CM | POA: Diagnosis not present

## 2021-12-05 DIAGNOSIS — E7801 Familial hypercholesterolemia: Secondary | ICD-10-CM | POA: Diagnosis present

## 2021-12-05 DIAGNOSIS — G43109 Migraine with aura, not intractable, without status migrainosus: Secondary | ICD-10-CM | POA: Diagnosis not present

## 2021-12-05 DIAGNOSIS — G459 Transient cerebral ischemic attack, unspecified: Principal | ICD-10-CM | POA: Diagnosis present

## 2021-12-05 DIAGNOSIS — Z87891 Personal history of nicotine dependence: Secondary | ICD-10-CM

## 2021-12-05 DIAGNOSIS — M6080 Other myositis, unspecified site: Secondary | ICD-10-CM | POA: Diagnosis not present

## 2021-12-05 DIAGNOSIS — E785 Hyperlipidemia, unspecified: Secondary | ICD-10-CM | POA: Diagnosis present

## 2021-12-05 DIAGNOSIS — Z888 Allergy status to other drugs, medicaments and biological substances status: Secondary | ICD-10-CM

## 2021-12-05 DIAGNOSIS — Z7984 Long term (current) use of oral hypoglycemic drugs: Secondary | ICD-10-CM | POA: Diagnosis not present

## 2021-12-05 DIAGNOSIS — Z83438 Family history of other disorder of lipoprotein metabolism and other lipidemia: Secondary | ICD-10-CM | POA: Diagnosis not present

## 2021-12-05 DIAGNOSIS — Z8616 Personal history of COVID-19: Secondary | ICD-10-CM | POA: Diagnosis not present

## 2021-12-05 DIAGNOSIS — Z6841 Body Mass Index (BMI) 40.0 and over, adult: Secondary | ICD-10-CM | POA: Diagnosis not present

## 2021-12-05 DIAGNOSIS — I25110 Atherosclerotic heart disease of native coronary artery with unstable angina pectoris: Secondary | ICD-10-CM | POA: Diagnosis not present

## 2021-12-05 DIAGNOSIS — I25119 Atherosclerotic heart disease of native coronary artery with unspecified angina pectoris: Secondary | ICD-10-CM | POA: Diagnosis not present

## 2021-12-05 DIAGNOSIS — Z823 Family history of stroke: Secondary | ICD-10-CM

## 2021-12-05 DIAGNOSIS — I209 Angina pectoris, unspecified: Secondary | ICD-10-CM | POA: Diagnosis not present

## 2021-12-05 DIAGNOSIS — Z8249 Family history of ischemic heart disease and other diseases of the circulatory system: Secondary | ICD-10-CM | POA: Diagnosis not present

## 2021-12-05 DIAGNOSIS — Z833 Family history of diabetes mellitus: Secondary | ICD-10-CM | POA: Diagnosis not present

## 2021-12-05 DIAGNOSIS — I10 Essential (primary) hypertension: Secondary | ICD-10-CM | POA: Diagnosis not present

## 2021-12-05 DIAGNOSIS — I6523 Occlusion and stenosis of bilateral carotid arteries: Secondary | ICD-10-CM | POA: Diagnosis not present

## 2021-12-05 DIAGNOSIS — Z79899 Other long term (current) drug therapy: Secondary | ICD-10-CM

## 2021-12-05 DIAGNOSIS — I2510 Atherosclerotic heart disease of native coronary artery without angina pectoris: Secondary | ICD-10-CM | POA: Diagnosis present

## 2021-12-05 DIAGNOSIS — I6612 Occlusion and stenosis of left anterior cerebral artery: Secondary | ICD-10-CM | POA: Diagnosis not present

## 2021-12-05 DIAGNOSIS — E119 Type 2 diabetes mellitus without complications: Secondary | ICD-10-CM | POA: Diagnosis not present

## 2021-12-05 DIAGNOSIS — I6381 Other cerebral infarction due to occlusion or stenosis of small artery: Secondary | ICD-10-CM | POA: Diagnosis not present

## 2021-12-05 DIAGNOSIS — E782 Mixed hyperlipidemia: Secondary | ICD-10-CM

## 2021-12-05 DIAGNOSIS — I6623 Occlusion and stenosis of bilateral posterior cerebral arteries: Secondary | ICD-10-CM | POA: Diagnosis not present

## 2021-12-05 DIAGNOSIS — R2990 Unspecified symptoms and signs involving the nervous system: Principal | ICD-10-CM

## 2021-12-05 DIAGNOSIS — E7849 Other hyperlipidemia: Secondary | ICD-10-CM | POA: Diagnosis not present

## 2021-12-05 DIAGNOSIS — Z882 Allergy status to sulfonamides status: Secondary | ICD-10-CM | POA: Diagnosis not present

## 2021-12-05 DIAGNOSIS — I672 Cerebral atherosclerosis: Secondary | ICD-10-CM | POA: Diagnosis not present

## 2021-12-05 DIAGNOSIS — I6621 Occlusion and stenosis of right posterior cerebral artery: Secondary | ICD-10-CM | POA: Diagnosis not present

## 2021-12-05 DIAGNOSIS — Q283 Other malformations of cerebral vessels: Secondary | ICD-10-CM | POA: Diagnosis not present

## 2021-12-05 HISTORY — PX: LEFT HEART CATH AND CORONARY ANGIOGRAPHY: CATH118249

## 2021-12-05 HISTORY — DX: Essential (primary) hypertension: I10

## 2021-12-05 LAB — RAPID URINE DRUG SCREEN, HOSP PERFORMED
Amphetamines: NOT DETECTED
Barbiturates: NOT DETECTED
Benzodiazepines: POSITIVE — AB
Cocaine: NOT DETECTED
Opiates: NOT DETECTED
Tetrahydrocannabinol: NOT DETECTED

## 2021-12-05 LAB — GLUCOSE, CAPILLARY
Glucose-Capillary: 142 mg/dL — ABNORMAL HIGH (ref 70–99)
Glucose-Capillary: 127 mg/dL — ABNORMAL HIGH (ref 70–99)
Glucose-Capillary: 122 mg/dL — ABNORMAL HIGH (ref 70–99)
Glucose-Capillary: 136 mg/dL — ABNORMAL HIGH (ref 70–99)

## 2021-12-05 SURGERY — LEFT HEART CATH AND CORONARY ANGIOGRAPHY
Anesthesia: LOCAL

## 2021-12-05 MED ORDER — SENNOSIDES-DOCUSATE SODIUM 8.6-50 MG PO TABS: 1.0000 | ORAL_TABLET | Freq: Every evening | ORAL | Status: DC | PRN

## 2021-12-05 MED ORDER — ACETAMINOPHEN 160 MG/5ML PO SOLN: 650.0000 mg | ORAL | Status: DC | PRN

## 2021-12-05 MED ORDER — LORAZEPAM 2 MG/ML IJ SOLN
2.0000 mg | Freq: Once | INTRAMUSCULAR | Status: AC
  Administered 2021-12-06: 2 mg via INTRAVENOUS
  Filled 2021-12-05: qty 1

## 2021-12-05 MED ORDER — VERAPAMIL HCL 2.5 MG/ML IV SOLN
INTRAVENOUS | Status: AC
  Filled 2021-12-05: qty 2

## 2021-12-05 MED ORDER — VERAPAMIL HCL 2.5 MG/ML IV SOLN
INTRAVENOUS | Status: DC | PRN
  Administered 2021-12-05: 10 mL via INTRA_ARTERIAL

## 2021-12-05 MED ORDER — LIDOCAINE HCL (PF) 1 % IJ SOLN
INTRAMUSCULAR | Status: DC | PRN
  Administered 2021-12-05: 2 mL

## 2021-12-05 MED ORDER — HEPARIN SODIUM (PORCINE) 1000 UNIT/ML IJ SOLN
INTRAMUSCULAR | Status: DC | PRN
  Administered 2021-12-05: 5000 [IU] via INTRAVENOUS

## 2021-12-05 MED ORDER — ACETAMINOPHEN 650 MG RE SUPP: 650.0000 mg | RECTAL | Status: DC | PRN

## 2021-12-05 MED ORDER — STROKE: EARLY STAGES OF RECOVERY BOOK
Freq: Once | Status: AC
  Filled 2021-12-05: qty 1

## 2021-12-05 MED ORDER — SODIUM CHLORIDE 0.9 % IV SOLN: 250.0000 mL | INTRAVENOUS | Status: DC | PRN

## 2021-12-05 MED ORDER — ACETAMINOPHEN 325 MG PO TABS: 650.0000 mg | ORAL_TABLET | ORAL | Status: DC | PRN

## 2021-12-05 MED ORDER — HYDRALAZINE HCL 20 MG/ML IJ SOLN
INTRAMUSCULAR | Status: DC | PRN
  Administered 2021-12-05: 20 mg via INTRAVENOUS

## 2021-12-05 MED ORDER — IOHEXOL 350 MG/ML SOLN
INTRAVENOUS | Status: DC | PRN
  Administered 2021-12-05: 95 mL

## 2021-12-05 MED ORDER — OXYCODONE HCL 5 MG PO TABS
5.0000 mg | ORAL_TABLET | ORAL | Status: DC
  Filled 2021-12-05: qty 1

## 2021-12-05 MED ORDER — MIDAZOLAM HCL 2 MG/2ML IJ SOLN
INTRAMUSCULAR | Status: DC | PRN
  Administered 2021-12-05: 1 mg via INTRAVENOUS

## 2021-12-05 MED ORDER — ISOSORBIDE MONONITRATE ER 60 MG PO TB24
60.0000 mg | ORAL_TABLET | Freq: Every evening | ORAL | Status: DC
  Administered 2021-12-05: 60 mg via ORAL
  Filled 2021-12-05 (×2): qty 1

## 2021-12-05 MED ORDER — LABETALOL HCL 5 MG/ML IV SOLN: 10.0000 mg | INTRAVENOUS | Status: AC | PRN

## 2021-12-05 MED ORDER — SODIUM CHLORIDE 0.9% FLUSH: 3.0000 mL | INTRAVENOUS | Status: DC | PRN

## 2021-12-05 MED ORDER — INSULIN ASPART 100 UNIT/ML IJ SOLN: 0.0000 [IU] | Freq: Every day | INTRAMUSCULAR | Status: DC

## 2021-12-05 MED ORDER — MIDAZOLAM HCL 2 MG/2ML IJ SOLN
INTRAMUSCULAR | Status: AC
  Filled 2021-12-05: qty 2

## 2021-12-05 MED ORDER — ACETAMINOPHEN 325 MG PO TABS
650.0000 mg | ORAL_TABLET | ORAL | Status: DC | PRN
  Filled 2021-12-05: qty 2

## 2021-12-05 MED ORDER — SODIUM CHLORIDE 0.9% FLUSH
3.0000 mL | Freq: Two times a day (BID) | INTRAVENOUS | Status: DC
  Administered 2021-12-05 – 2021-12-06 (×3): 3 mL via INTRAVENOUS

## 2021-12-05 MED ORDER — SODIUM CHLORIDE 0.9 % IV SOLN: INTRAVENOUS | Status: DC

## 2021-12-05 MED ORDER — SODIUM CHLORIDE 0.9% FLUSH: 3.0000 mL | Freq: Two times a day (BID) | INTRAVENOUS | Status: DC

## 2021-12-05 MED ORDER — HEPARIN SODIUM (PORCINE) 1000 UNIT/ML IJ SOLN
INTRAMUSCULAR | Status: AC
  Filled 2021-12-05: qty 10

## 2021-12-05 MED ORDER — ASPIRIN 325 MG PO TABS
325.0000 mg | ORAL_TABLET | Freq: Once | ORAL | Status: AC
  Administered 2021-12-05: 325 mg via ORAL
  Filled 2021-12-05: qty 1

## 2021-12-05 MED ORDER — ENOXAPARIN SODIUM 80 MG/0.8ML IJ SOSY
70.0000 mg | PREFILLED_SYRINGE | INTRAMUSCULAR | Status: DC
  Administered 2021-12-05 – 2021-12-06 (×2): 70 mg via SUBCUTANEOUS
  Filled 2021-12-05 (×2): qty 0.8

## 2021-12-05 MED ORDER — FENTANYL CITRATE (PF) 100 MCG/2ML IJ SOLN
INTRAMUSCULAR | Status: AC
  Filled 2021-12-05: qty 2

## 2021-12-05 MED ORDER — FENTANYL CITRATE (PF) 100 MCG/2ML IJ SOLN
INTRAMUSCULAR | Status: DC | PRN
  Administered 2021-12-05: 25 ug via INTRAVENOUS

## 2021-12-05 MED ORDER — HYDRALAZINE HCL 20 MG/ML IJ SOLN
INTRAMUSCULAR | Status: AC
  Filled 2021-12-05: qty 1

## 2021-12-05 MED ORDER — ASPIRIN 81 MG PO CHEW: 81.0000 mg | CHEWABLE_TABLET | ORAL | Status: DC

## 2021-12-05 MED ORDER — INSULIN ASPART 100 UNIT/ML IJ SOLN
0.0000 [IU] | Freq: Three times a day (TID) | INTRAMUSCULAR | Status: DC
  Administered 2021-12-05 – 2021-12-06 (×3): 2 [IU] via SUBCUTANEOUS

## 2021-12-05 MED ORDER — HEPARIN (PORCINE) IN NACL 1000-0.9 UT/500ML-% IV SOLN
INTRAVENOUS | Status: AC
  Filled 2021-12-05: qty 1000

## 2021-12-05 MED ORDER — HYDRALAZINE HCL 20 MG/ML IJ SOLN: 10.0000 mg | INTRAMUSCULAR | Status: AC | PRN

## 2021-12-05 MED ORDER — LIDOCAINE HCL (PF) 1 % IJ SOLN
INTRAMUSCULAR | Status: AC
  Filled 2021-12-05: qty 30

## 2021-12-05 MED ORDER — IOHEXOL 350 MG/ML SOLN
100.0000 mL | Freq: Once | INTRAVENOUS | Status: AC | PRN
  Administered 2021-12-05: 100 mL via INTRAVENOUS

## 2021-12-05 MED ORDER — FLUTICASONE PROPIONATE 50 MCG/ACT NA SUSP
2.0000 | Freq: Every morning | NASAL | Status: DC
  Administered 2021-12-05 – 2021-12-06 (×2): 2 via NASAL
  Filled 2021-12-05: qty 16

## 2021-12-05 MED ORDER — ONDANSETRON HCL 4 MG/2ML IJ SOLN: 4.0000 mg | Freq: Four times a day (QID) | INTRAMUSCULAR | Status: DC | PRN

## 2021-12-05 MED ORDER — PANTOPRAZOLE SODIUM 40 MG PO TBEC
40.0000 mg | DELAYED_RELEASE_TABLET | Freq: Every evening | ORAL | Status: DC
  Administered 2021-12-05 – 2021-12-06 (×2): 40 mg via ORAL
  Filled 2021-12-05 (×2): qty 1

## 2021-12-05 SURGICAL SUPPLY — 13 items
BAND ZEPHYR COMPRESS 30 LONG (HEMOSTASIS) ×1 IMPLANT
CATH DIAG 6FR JR4 (CATHETERS) ×1 IMPLANT
CATH DIAG 6FR PIGTAIL ANGLED (CATHETERS) ×1 IMPLANT
CATH INFINITI 6F FL3.5 (CATHETERS) ×1 IMPLANT
CATH LAUNCHER 6FR EBU3.5 (CATHETERS) ×1 IMPLANT
GLIDESHEATH SLEND SS 6F .021 (SHEATH) ×1 IMPLANT
GUIDEWIRE INQWIRE 1.5J.035X260 (WIRE) IMPLANT
INQWIRE 1.5J .035X260CM (WIRE) ×2
KIT HEART LEFT (KITS) ×2 IMPLANT
PACK CARDIAC CATHETERIZATION (CUSTOM PROCEDURE TRAY) ×2 IMPLANT
SHEATH PROBE COVER 6X72 (BAG) ×1 IMPLANT
TRANSDUCER W/STOPCOCK (MISCELLANEOUS) ×2 IMPLANT
TUBING CIL FLEX 10 FLL-RA (TUBING) ×2 IMPLANT

## 2021-12-05 NOTE — Consult Note (Signed)
Neurology Consultation ? ?Reason for Consult: Code stroke ?Referring Physician: Dr. Ali Lowe ? ?CC: cardiac cath  ? ?History is obtained from:patient ? ?HPI: Penny Huerta is a 56 y.o. female with past medical history of HTN, HLD, PAF ( not on AC), morbid obesity, DM, anxiety/depression and migraines who presents today for Cardiac cath. After cardiac cath @ 0820, patient stated she had floaters in her periphery that were black, silver and white in color. NIHSS 0. Code stroke called. Neurology team met patient in CT scan. She states this is similar to her migraine symptoms and endorses a headache today but not her usual type of migraines.  ? ? ?LKW: 0820 ?tpa given?: no, low NIHSS and symptoms resolved ?Premorbid modified Rankin scale (mRS):  ?0-Completely asymptomatic and back to baseline post-stroke ? ? ?ROS: Full ROS was performed and is negative except as noted in the HPI.   ? ?Past Medical History:  ?Diagnosis Date  ? Allergy   ? Anxiety   ? Atrial fibrillation ([place])   ? Diabetes mellitus without complication ([place])   ? Heart murmur   ? ARRHYTHMIA  ? Hyperlipidemia   ? ? ? ?Essential (primary) hypertension, Heart failure, unspecified, Paroxysmal atrial fibrillation, Type 2 diabetes mellitus w/o complications, and Morbid Obesity(BMI > 40)  ? ?Family History  ?Problem Relation Age of Onset  ? Hyperlipidemia Mother   ? Transient ischemic attack Mother   ? Hypertension Mother   ? Diabetes Mother   ? Transient ischemic attack Father   ? Ovarian cancer Paternal Grandmother   ? Hyperlipidemia Maternal Grandfather   ? Heart disease Maternal Grandfather   ? Hyperlipidemia Maternal Grandmother   ? Heart disease Maternal Grandmother   ? Stroke Paternal Grandfather   ? Hypertension Paternal Grandfather   ? Diabetes Paternal Grandfather   ? Sudden death Maternal Uncle   ? ? ? ?Social History:  ? reports that she quit smoking about 24 years ago. Her smoking use included cigarettes. She has never used smokeless tobacco. She  reports that she does not drink alcohol and does not use drugs. ? ?Medications ? ?Current Facility-Administered Medications:  ?  0.9 %  sodium chloride infusion, 250 mL, Intravenous, PRN, Early Osmond, MD ?  0.9 %  sodium chloride infusion, , Intravenous, Continuous, Early Osmond, MD, Last Rate: 10 mL/hr at 12/05/21 0631, New Bag at 12/05/21 0631 ?  aspirin tablet 325 mg, 325 mg, Oral, Once, August Albino, NP ?  fentaNYL (SUBLIMAZE) injection, , , PRN, Early Osmond, MD, 25 mcg at 12/05/21 0731 ?  heparin sodium (porcine) injection, , , PRN, Early Osmond, MD, 5,000 Units at 12/05/21 6578 ?  hydrALAZINE (APRESOLINE) injection, , , PRN, Early Osmond, MD, 20 mg at 12/05/21 0802 ?  iohexol (OMNIPAQUE) 350 MG/ML injection, , , PRN, Early Osmond, MD, 95 mL at 12/05/21 4696 ?  lidocaine (PF) (XYLOCAINE) 1 % injection, , , PRN, Early Osmond, MD, 2 mL at 12/05/21 0738 ?  midazolam (VERSED) injection, , , PRN, Early Osmond, MD, 1 mg at 12/05/21 0731 ?  Radial Cocktail/Verapamil only, , , PRN, Early Osmond, MD, 10 mL at 12/05/21 0739 ?  sodium chloride flush (NS) 0.9 % injection 3 mL, 3 mL, Intravenous, Q12H, Ali Lowe, Arlie Solomons, MD ?  sodium chloride flush (NS) 0.9 % injection 3 mL, 3 mL, Intravenous, PRN, Early Osmond, MD ? ? ?Exam: ?Current vital signs: ?BP 139/76   Pulse 83   Temp  98.1 ?F (36.7 ?C) (Oral)   Resp 14   Ht 5' 6"  (1.676 m)   Wt (!) 145.2 kg   LMP 01/06/2012   SpO2 98%   BMI 51.65 kg/m?  ?Vital signs in last 24 hours: ?Temp:  [98.1 ?F (36.7 ?C)] 98.1 ?F (36.7 ?C) (03/24 0354) ?Pulse Rate:  [78-87] 83 (03/24 0830) ?Resp:  [11-23] 14 (03/24 0806) ?BP: (113-148)/(44-118) 139/76 (03/24 0830) ?SpO2:  [96 %-100 %] 98 % (03/24 0830) ?Weight:  [145.2 kg] 145.2 kg (03/24 0626) ? ?GENERAL: Awake, alert, obese in NAD ?HEENT: - Normocephalic and atraumatic, dry mm, Xanthelasma present ?LUNGS - Clear to auscultation bilaterally with no wheezes ?CV - S1S2 RRR, no m/r/g, equal  pulses bilaterally. ?ABDOMEN - Soft, nontender, nondistended with normoactive BS ?Ext: warm, well perfused, intact peripheral pulses, no edema ? ?NEURO:  ?Mental Status: AA&Ox4 ?Language: speech is clear.  Naming, repetition, fluency, and comprehension intact. ?Cranial Nerves: PERRL 18m/brisk. EOMI, visual fields full, no facial asymmetry, facial sensation intact, hearing intact, tongue/uvula/soft palate midline, normal sternocleidomastoid and trapezius muscle strength. No evidence of tongue atrophy or fibrillations ?Motor: 5/5 in all 4 extremities ?Tone: is normal and bulk is normal ?Sensation- Intact to light touch bilaterally ?Coordination: FTN intact bilaterally, no ataxia in BLE. ?Gait- deferred ? ?NIHSS: ?1a Level of Conscious.: 0 ?1b LOC Questions: 0 ?1c LOC Commands: 0 ?2 Best Gaze: 0 ?3 Visual: 0 ?4 Facial Palsy: 0 ?5a Motor Arm - left: 0 ?5b Motor Arm - Right: 0 ?6a Motor Leg - Left: 0 ?6b Motor Leg - Right: 0 ?7 Limb Ataxia: 0 ?8 Sensory: 0 ?9 Best Language: 0 ?10 Dysarthria: 0 ?11 Extinct. and Inatten.: 0 ?TOTAL: 0  ? ?Labs ?I have reviewed labs in epic and the results pertinent to this consultation are: ? ?Imaging ?I have reviewed the images obtained: ? ?Code stroke CT-head 3/24: ?-14 mm infarct at the junction of the right caudate nucleus and right corona radiata/internal capsule, new from the prior head CT of 04/05/2006 but otherwise age-indeterminate.  ? -Lacunar infarct within the left basal ganglia/internal capsule, also new from the prior head CT but chronic in appearance. ?- No acute intracranial hemorrhage or acute demarcated cortical infarction. ? -Mild generalized cerebral atrophy. ?  ?MRI examination of the brain-ordered ? ?Echo 09/2021: ?1. Left ventricular ejection fraction, by estimation, is 60 to 65%. The left ventricle has normal function. The left ventricle has no regional wall motion abnormalities. There is mild concentric left ventricular hypertrophy. Left ventricular diastolic  parameters are indeterminate. The average left ventricular global longitudinal strain is -17.0%. The global longitudinal strain is normal.  ? 2. Right ventricular systolic function is normal. The right ventricular size is normal. There is normal pulmonary artery systolic pressure.  ? 3. Left atrial size was mild to moderately dilated.  ? 4. The mitral valve is grossly normal. Trivial mitral valve  ?regurgitation. No evidence of mitral stenosis.  ? 5. The aortic valve is grossly normal. There is mild calcification of the aortic valve. There is mild thickening of the aortic valve. Aortic valve regurgitation is not visualized. Mild aortic valve stenosis.  ? 6. The inferior vena cava is normal in size with greater than 50% respiratory variability, suggesting right atrial pressure of 3 mmHg.  ? ?Assessment:  ?Penny BRONDERis a 56y.o. female with past medical history of HTN, HLD, PAF ( not on AC), morbid obesity, DM, anxiety/depression and migraines who presents today for Cardiac cath. After cardiac cath @ 0820,  patient stated she had floaters in her periphery that were black, silver and white in color. NIHSS 0. Code stroke called. Neurology team met patient in CT scan. She states this is similar to her migraine symptoms and endorses a headache today but not her usual type of migraines.  ? ?Acute lacunar infarct possibly due to cardio embolic source  ? ?Recommendations: ?- admit to obs under medicine for stroke workup due to multiple co morbidities ?- HgbA1c, fasting lipid panel ?- MRI of the brain without contrast ?- Dysphagia screen if passes start a carb consistent diet ?- Frequent neuro checks ?- CTA head and neck ?- Prophylactic therapy-Antiplatelet med: Received 366m today start Aspirin - dose 853mand plavix 7546maily ?- Risk factor modification ?- Telemetry monitoring ?- PT consult, OT consult, Speech consult ?- Stroke team to follow  ? ?DenBeulah GandyP, ACNLake Wazee[place] ? ?

## 2021-12-05 NOTE — Progress Notes (Signed)
Report called to Selena Batten, RN [id number] and client transferred via wheelchair with cardiac monitor ?

## 2021-12-05 NOTE — Interval H&P Note (Signed)
History and Physical Interval Note: ? ?12/05/2021 ?6:50 AM ? ?Penny Huerta  has presented today for surgery, with the diagnosis of chest pain.  The various methods of treatment have been discussed with the patient and family. After consideration of risks, benefits and other options for treatment, the patient has consented to  Procedure(s): ?LEFT HEART CATH AND CORONARY ANGIOGRAPHY (N/A) as a surgical intervention.  The patient's history has been reviewed, patient examined, no change in status, stable for surgery.  I have reviewed the patient's chart and labs.  Questions were answered to the patient's satisfaction.   ? ?Cath Lab Visit (complete for each Cath Lab visit) [name]:  [name]: No. [name]:   [name]: CCS III [name]: Maximal Therapy (2 or more classes of medications) [name]: No non-invasive testing performed [name]: No previous CABG ? ? ? ? ? ? ? ?Penny Huerta ? ? ?

## 2021-12-05 NOTE — Assessment & Plan Note (Addendum)
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 Debara Pickett, will need discussion with interventional cardiology Monday to determine if PCI is option ?She may consider second opinion regarding surgery ?She was insistent on leaving, recommended staying for further discussion with cardiology regarding next steps, but she wanted to leave.  Recommended outpatient follow up with cards, ASAP. ?

## 2021-12-05 NOTE — Assessment & Plan Note (Addendum)
Recent A1c was 6, indicating good control ?Resume Glucophage, Jardiance ?

## 2021-12-05 NOTE — Consult Note (Signed)
Reason for Consult:2 vessel CAD ?Referring Physician: Dr. Chauncey Cruel ? ?Penny Huerta is an 56 y.o. female.  ?HPI: 56 yo woman with multiple CRF including, type II diabetes, hypertension, morbid obesity and familial hypercholesterolemia who presents with exertional Cp and dyspnea. Started on Imdur and metoprolol, but symptoms persisted. Coronary CTA was not definitive. Today underwent cath which revealed an 80% LAD stenosis and RCA disease. RCA has diffuse disease, tight stenoses in PD and PL2. ? ?Post cath had visual disturbance and admitted as possible TIA. W/u underway by Neurology, but symptoms have resolved. ? ?Past Medical History:  ?Diagnosis Date  ? Allergy   ? Anxiety   ? Atrial fibrillation ([place])   ? Diabetes mellitus without complication ([place])   ? Heart murmur   ? ARRHYTHMIA  ? Hyperlipidemia   ? Hypertension   ? ? ?Past Surgical History:  ?Procedure Laterality Date  ? HYSTEROSCOPY W/ ENDOMETRIAL ABLATION  2011  ? LEFT HEART CATH AND CORONARY ANGIOGRAPHY N/A 12/05/2021  ? Procedure: LEFT HEART CATH AND CORONARY ANGIOGRAPHY;  Surgeon: Early Osmond, MD;  Location: [place] [facility];  Service: Cardiovascular;  Laterality: N/A;  ? ? ?Family History  ?Problem Relation Age of Onset  ? Hyperlipidemia Mother   ? Transient ischemic attack Mother   ? Hypertension Mother   ? Diabetes Mother   ? Transient ischemic attack Father   ? Heart disease Maternal Grandmother 26  ? Hyperlipidemia Maternal Grandmother   ? Hyperlipidemia Maternal Grandfather   ? Heart disease Maternal Grandfather   ? Ovarian cancer Paternal Grandmother   ? Stroke Paternal Grandfather   ? Hypertension Paternal Grandfather   ? Diabetes Paternal Grandfather   ? Sudden death Maternal Uncle   ? ? ?Social History:  reports that she quit smoking about 24 years ago. Her smoking use included cigarettes. She has never used smokeless tobacco. She reports that she does not currently use alcohol. She reports that she does not currently use  drugs. ? ?Allergies:  ?Allergies  ?Allergen Reactions  ? Alprazolam Other (See Comments)  ?  Caused mood changes  ? Citalopram Hydrobromide Other (See Comments)  ?  Mood changes  ? Nitrofurantoin Monohyd Macro Nausea And Vomiting  ? Statins Other (See Comments)  ?  MUSCLE LOSS   ? Sulfa Drugs Cross Reactors Nausea And Vomiting  ? ? ?Medications: Scheduled: ? enoxaparin (LOVENOX) injection  70 mg Subcutaneous Q24H  ? fluticasone  2 spray Each Nare q AM  ? insulin aspart  0-15 Units Subcutaneous TID WC  ? insulin aspart  0-5 Units Subcutaneous QHS  ? isosorbide mononitrate  60 mg Oral QPM  ? LORazepam  2 mg Intravenous Once  ? pantoprazole  40 mg Oral QPM  ? sodium chloride flush  3 mL Intravenous Q12H  ? ? ?Results for orders placed or performed during the hospital encounter of 12/05/21 (from the past 48 hour(s))  ?Glucose, capillary     Status: Abnormal  ? Collection Time: 12/05/21  8:28 AM  ?Result Value Ref Range  ? Glucose-Capillary 142 (H) 70 - 99 mg/dL  ?  Comment: Glucose reference range applies only to samples taken after fasting for at least 8 hours.  ? Comment 1 Notify RN   ? Comment 2 Document in Chart   ?Glucose, capillary     Status: Abnormal  ? Collection Time: 12/05/21 12:55 PM  ?Result Value Ref Range  ? Glucose-Capillary 127 (H) 70 - 99 mg/dL  ?  Comment: Glucose reference range  applies only to samples taken after fasting for at least 8 hours.  ?Glucose, capillary     Status: Abnormal  ? Collection Time: 12/05/21  4:19 PM  ?Result Value Ref Range  ? Glucose-Capillary 122 (H) 70 - 99 mg/dL  ?  Comment: Glucose reference range applies only to samples taken after fasting for at least 8 hours.  ? ? ?CARDIAC CATHETERIZATION ? ?Result Date: 12/05/2021 ?  Ost RCA lesion is 70% stenosed.   Mid RCA lesion is 60% stenosed.   RPDA lesion is 95% stenosed.   2nd RPL lesion is 99% stenosed.   Prox LAD lesion is 80% stenosed.   LV end diastolic pressure is moderately elevated.   The left ventricular ejection  fraction is 55-65% by visual estimate. 1.  Multivessel coronary artery disease consisting of ostial right coronary artery, mid right coronary artery, right PDA, R PLV, and mid LAD disease.  The patient will be referred to cardiothoracic surgery for further recommendations. 2.  LVEDP of 21 mmHg with preserved LV function.  ? ?CT HEAD CODE STROKE WO CONTRAST ? ?Addendum Date: 12/05/2021   ?ADDENDUM REPORT: 12/05/2021 10:31 ADDENDUM: These results were called by telephone at the time of interpretation on 12/05/2021 at 9:12 am to provider Dr. Theda Sers , who verbally acknowledged these results. Electronically Signed   By: Kellie Simmering D.O.   On: 12/05/2021 10:31  ? ?Result Date: 12/05/2021 ?CLINICAL DATA:  Code stroke. Neuro deficit, acute, stroke suspected. EXAM: CT HEAD WITHOUT CONTRAST TECHNIQUE: Contiguous axial images were obtained from the base of the skull through the vertex without intravenous contrast. RADIATION DOSE REDUCTION: This exam was performed according to the departmental dose-optimization program which includes automated exposure control, adjustment of the mA and/or kV according to patient size and/or use of iterative reconstruction technique. COMPARISON:  Prior head CT 04/05/2006. FINDINGS: Brain: Mild generalized cerebral atrophy. 14 mm infarct at the junction of the right caudate nucleus and right corona radiata/internal capsule, new from the prior head CT of 04/05/2006, but otherwise age-indeterminate. Lacunar infarct within left basal ganglia/internal capsule, also new from the prior MRI but There is no acute intracranial hemorrhage. No demarcated cortical infarct. No extra-axial fluid collection. No evidence of an intracranial mass. No midline shift. Vascular: No definite hyperdense vessel. Atherosclerotic calcifications. Skull: Normal. Negative for fracture or focal lesion. Sinuses/Orbits: Visualized orbits show no acute finding. Trace mucosal thickening within the left ethmoid air cells. ASPECTS  ([place] Stroke Program Early CT Score) - Ganglionic level infarction (caudate, lentiform nuclei, internal capsule, insula, M1-M3 cortex): 5 - Supraganglionic infarction (M4-M6 cortex): 3 Total score (0-10 with 10 being normal): 8 Attempts are being made to reach the ordering provider at this time. IMPRESSION: 14 mm infarct at the junction of the right caudate nucleus and right corona radiata/internal capsule, new from the prior head CT of 04/05/2006 but otherwise age-indeterminate. A brain MRI may be obtained for further evaluation, as clinically warranted. Lacunar infarct within the left basal ganglia/internal capsule, also new from the prior head CT but chronic in appearance. No acute intracranial hemorrhage or acute demarcated cortical infarction. Mild generalized cerebral atrophy. Electronically Signed: By: Kellie Simmering D.O. On: 12/05/2021 09:10   ? ?I personally reviewed the cath images. Severe 2 vessel CAD involving LAD and RCA. ?Review of Systems  ?Constitutional:  Positive for activity change and fatigue.  ?Respiratory:  Positive for shortness of breath.   ?Cardiovascular:  Positive for chest pain.  ?Blood pressure 125/88, pulse (!) 103, temperature 98.7 ?F (  37.1 ?C), temperature source Oral, resp. rate 20, height 5\' 6"  (1.676 m), weight (!) 145.2 kg, last menstrual period 01/06/2012, SpO2 95 %. ?Physical Exam ?Vitals reviewed.  ?Constitutional:   ?   General: She is not in acute distress. ?   Appearance: She is obese.  ?HENT:  ?   Head: Normocephalic and atraumatic.  ?Eyes:  ?   General: No scleral icterus. ?Cardiovascular:  ?   Rate and Rhythm: Normal rate and regular rhythm.  ?   Heart sounds: Murmur (2/6 systolic murmur) heard.  ?Pulmonary:  ?   Effort: Pulmonary effort is normal. No respiratory distress.  ?   Breath sounds: No wheezing.  ?   Comments: Diminished BS bilaterally ?Abdominal:  ?   Palpations: Abdomen is soft.  ?Skin: ?   General: Skin is warm and dry.  ?Neurological:  ?   General: No focal  deficit present.  ?   Mental Status: She is alert and oriented to person, place, and time.  ?   Cranial Nerves: No cranial nerve deficit.  ? ? ?Assessment/Plan: ?56 yo woman with a history of hypertension, familial hyperlipi

## 2021-12-05 NOTE — Assessment & Plan Note (Addendum)
-  Body mass index is 51.65 kg/m?..  ?Weight loss should be encouraged ?Outpatient PCP/bariatric medicine/bariatric surgery f/u encouraged ?

## 2021-12-05 NOTE — Progress Notes (Signed)
Nutrition Brief Note ? ?Patient identified on the Malnutrition Screening Tool (MST) Report ? ?Wt Readings from Last 15 Encounters:  ?12/05/21 (!) 145.2 kg  ?10/02/21 (!) 148.8 kg  ?06/19/21 (!) 146.9 kg  ?05/28/21 (!) 145.6 kg  ?04/10/21 (!) 145.8 kg  ?03/05/21 (!) 143.3 kg  ?11/25/20 (!) 143.8 kg  ?08/11/20 (!) 138 kg  ?02/19/20 (!) 143.3 kg  ?01/22/20 (!) 143.3 kg  ?03/10/13 123.3 kg  ?01/27/12 121.6 kg  ?10/22/11 119.7 kg  ?05/21/11 122.2 kg  ?02/17/11 123.2 kg  ? ?Penny Huerta is a 56 y.o. female with past medical history of HTN, HLD, PAF ( not on AC), morbid obesity, DM, anxiety/depression and migraines who presents today for Cardiac cath. After cardiac cath @ 0820, patient stated she had floaters in her periphery that were black, silver and white in color. NIHSS 0. Code stroke called. Neurology team met patient in CT scan. She states this is similar to her migraine symptoms and endorses a headache today but not her usual type of migraines.  ? ?Pt admitted with 14 mm infarct at junction of R caudate nucleus and R corona radiata/internal capsule and L basal ganglia.  ? ?Pt unavailable at time of visit. Attempted to speak with pt via call to hospital room phone, however, unable to reach. RD unable to obtain further nutrition-related history or complete nutrition-focused physical exam at this time.   ? ?Reviewed wt hx; wt has been stable over the past 6 months.  ? ?Lab Results  ?Component Value Date  ? HGBA1C 5.8 (H) 03/05/2021  ? PTA DM medications are 500 mg metformin BID.  ? ?Labs reviewed: CBGS: 127-142 (inpatient orders for glycemic control are 0-15 units insulin aspart TID with meals and 0-5 units insulin aspart daily at bedtime).   ? ?Body mass index is 51.65 kg/m?Marland Kitchen Patient meets criteria for morbid obesity, class III based on current BMI. Obesity is a complex, chronic medical condition that is optimally managed by a multidisciplinary care team. Weight loss is not an ideal goal for an acute inpatient  hospitalization. However, if further work-up for obesity is warranted, consider outpatient referral to outpatient bariatric service and/or [employer] [facility] Nutrition and Diabetes Education Services.   ? ?Current diet order is heart healthy/ carb modified, patient is consuming approximately n/a% of meals at this time. Labs and medications reviewed.  ? ?No nutrition interventions warranted at this time. If nutrition issues arise, please consult RD.  ? ?Loistine Chance, RD, LDN, CDCES ?Registered Dietitian II ?Certified Diabetes Care and Education Specialist ?Please refer to [facility] for RD and/or RD on-call/weekend/after hours pager   ?

## 2021-12-05 NOTE — Evaluation (Signed)
Physical Therapy Evaluation ?Patient Details ?Name: Penny Huerta ?MRN: [id number] ?DOB: 1965-12-06 ?Today's Date: 12/05/2021 ? ?History of Present Illness ? Pt is 56 yo female who presented on 12/05/21 for cardiac cath, during recovery developed floaters in her periphery - Code stroke called.  Pt has had similar symptoms with migraines. CT of head did reveal 14 mm infarct at junction of R caudate nucleus and R corona radiata/internal capsule and L basal ganglia (both new since last CT 2007 but age indeterminate).  Pt with hx of  HTN, HLD, PAF ( not on AC), morbid obesity, DM, anxiety/depression and migraines  ?Clinical Impression ? Pt admitted with above diagnosis.  Her symptoms have resolved and she feels back to baseline.  Strength, coordination, visual fields, and sensation all intact.  Pt's balance is at baseline with use of cane for ambulation.  She was able to perform steps and ambulate safely.  No further acute PT indicated.   ?   ? ?Recommendations for follow up therapy are one component of a multi-disciplinary discharge planning process, led by the attending physician.  Recommendations may be updated based on patient status, additional functional criteria and insurance authorization. ? ?Follow Up Recommendations No PT follow up ? ?  ?Assistance Recommended at Discharge None  ?Patient can return home with the following ?   ? ?  ?Equipment Recommendations None recommended by PT  ?Recommendations for Other Services ?    ?  ?Functional Status Assessment Patient has not had a recent decline in their functional status  ? ?  ?Precautions / Restrictions Precautions ?Precautions: None ?Restrictions ?Weight Bearing Restrictions: No  ? ?  ? ?Mobility ? Bed Mobility ?Overal bed mobility: Independent ?  ?  ?  ?  ?  ?  ?  ?  ? ?Transfers ?Overall transfer level: Needs assistance ?Equipment used: None ?Transfers: Sit to/from Stand ?Sit to Stand: Supervision ?  ?  ?  ?  ?  ?General transfer comment: Performed safely with  supervision -; at independent level ?  ? ?Ambulation/Gait ?Ambulation/Gait assistance: Supervision ?Gait Distance (Feet): 200 Feet ?Assistive device: Straight cane ?Gait Pattern/deviations: Step-through pattern ?Gait velocity: normal ?  ?  ?General Gait Details: Pt ambulated in room without AD but in hallway with cane; demonstrated safe normal gait pattern - reports feels at baseline ? ?Stairs ?Stairs: Yes ?Stairs assistance: Supervision ?Stair Management: One rail Left, Alternating pattern, Forwards ?Number of Stairs: 5 ?General stair comments: performed without difficulty ? ?Wheelchair Mobility ?  ? ?Modified Rankin (Stroke Patients Only) ?Modified Rankin (Stroke Patients Only) ?Pre-Morbid Rankin Score: No symptoms ?Modified Rankin: No symptoms ? ?  ? ?Balance Overall balance assessment: Needs assistance ?Sitting-balance support: No upper extremity supported ?Sitting balance-Leahy Scale: Normal ?  ?  ?Standing balance support: No upper extremity supported, Single extremity supported ?Standing balance-Leahy Scale: Good ?Standing balance comment: uses cane for longer distance ?  ?  ?  ?  ?  ?  ?High level balance activites: Side stepping, Direction changes, Turns, Head turns ?High Level Balance Comments: all above without difficulty ?  ?  ?  ?   ? ? ? ?Pertinent Vitals/Pain Pain Assessment ?Pain Assessment: No/denies pain  ? ? ?Home Living Family/patient expects to be discharged to:: Private residence ?Living Arrangements: Spouse/significant other ?Available Help at Discharge: Family;Available PRN/intermittently ?Type of Home: House ?Home Access: Stairs to enter ?Entrance Stairs-Rails: None ?Entrance Stairs-Number of Steps: 5 ?Alternate Level Stairs-Number of Steps: 14 ?Home Layout: Two level;Bed/bath upstairs;1/2 bath on main level ?Home  Equipment: Kasandra Knudsen - single Barista (2 wheels) ?   ?  ?Prior Function Prior Level of Function : Independent/Modified Independent;Working/employed ?  ?  ?  ?  ?  ?   ?Mobility Comments: Ambulates without AD in home and with cane in community ?ADLs Comments: Independent with ADLS and IADLs; works as Glass blower/designer ?  ? ? ?Hand Dominance  ?   ? ?  ?Extremity/Trunk Assessment  ? Upper Extremity Assessment ?Upper Extremity Assessment: Overall WFL for tasks assessed (R wrist in splint from cardiac cath but otherwise all [facility]) ?  ? ?Lower Extremity Assessment ?Lower Extremity Assessment: Overall WFL for tasks assessed (ROM WFL, MMT 5/5, sensation intact, coordination with heel/shin intact) ?  ? ?Cervical / Trunk Assessment ?Cervical / Trunk Assessment: Normal  ?Communication  ? Communication: No difficulties  ?Cognition Arousal/Alertness: Awake/alert ?Behavior During Therapy: [facility] for tasks assessed/performed ?Overall Cognitive Status: Within Functional Limits for tasks assessed ?  ?  ?  ?  ?  ?  ?  ?  ?  ?  ?  ?  ?  ?  ?  ?  ?  ?  ?  ? ?  ?General Comments General comments (skin integrity, edema, etc.): [facility], visual fields : intact ? ?  ?Exercises    ? ?Assessment/Plan  ?  ?PT Assessment Patient does not need any further PT services  ?PT Problem List   ? ?   ?  ?PT Treatment Interventions     ? ?PT Goals (Current goals can be found in the Care Plan section)  ?Acute Rehab PT Goals ?Patient Stated Goal: return home ?PT Goal Formulation: All assessment and education complete, DC therapy ? ?  ?Frequency   ?  ? ? ?Co-evaluation   ?  ?  ?  ?  ? ? ?  ?AM-PAC PT "6 Clicks" Mobility  ?Outcome Measure Help needed turning from your back to your side while in a flat bed without using bedrails?: None ?Help needed moving from lying on your back to sitting on the side of a flat bed without using bedrails?: None ?Help needed moving to and from a bed to a chair (including a wheelchair)?: None ?Help needed standing up from a chair using your arms (e.g., wheelchair or bedside chair)?: None ?Help needed to walk in hospital room?: None ?Help needed climbing 3-5 steps with a railing? : None ?6 Click Score:  24 ? ?  ?End of Session   ?Activity Tolerance: Patient tolerated treatment well ?Patient left: in bed;with call bell/phone within reach ?  ?  ?  ? ?Time: [phone] (Out of room for 8-10 mins when physician into see pt, so only 1 unit) ?PT Time Calculation (min) (ACUTE ONLY): 27 min ? ? ?Charges:   PT Evaluation ?$PT Eval Low Complexity: 1 Low ?  ?  ?   ? ? ?Abran Richard, PT ?Acute Rehab Services ?Pager [phone] ?[facility] [phone] ? ? ?Mikael Spray Hayden Mabin ?12/05/2021, 3:01 PM ? ?

## 2021-12-05 NOTE — H&P (Signed)
?Cardiology Admission History and Physical:  ? ?Patient ID: Penny Huerta ?MRN: [id number]; DOB: 05-Dec-1965  ? ?Admission date: 12/05/2021 ? ?PCP:  Katherina Mires, MD ?  ?[facility] Providers ?Cardiologist: Lenna Sciara MD ? ? ?Chief Complaint: Exertional angina despite multiple antianginal agents ? ? ?History of Present Illness:  ? ?Penny Huerta is a 56 year old female with a history of type 2 diabetes, morbid obesity, and an familial hyperlipidemia intolerant of statins was evaluated in the outpatient setting due to exertional angina.  She was started on Imdur, metoprolol, and as needed nitroglycerin.  She was referred for coronary CTA which demonstrated mild to moderate disease of the left circumflex and LAD and the right coronary artery could not be assessed.  The patient continues to have chest pain despite antianginal therapy.  She is unable to perform her activities of daily living.  Due to lifestyle limiting angina she is referred for coronary angiography study today. ? ?In addition to exertional chest pain she does report chronic shortness of breath, occasional presyncope, no syncope, no severe bleeding, no signs or symptoms of stroke, and no peripheral edema. ? ? ?Past Medical History:  ?Diagnosis Date  ? Allergy   ? Anxiety   ? Atrial fibrillation ([place])   ? Diabetes mellitus without complication ([place])   ? Heart murmur   ? ARRHYTHMIA  ? Hyperlipidemia   ? ? ?Past Surgical History:  ?Procedure Laterality Date  ? HYSTEROSCOPY W/ ENDOMETRIAL ABLATION  2011  ?  ? ?Medications Prior to Admission: ?Prior to Admission medications   ?Medication Sig Start Date End Date Taking? Authorizing Provider  ?aspirin EC 81 MG tablet Take 81 mg by mouth in the morning. Swallow whole.   Yes [provider]  ?empagliflozin (JARDIANCE) 10 MG TABS tablet Take 1 tablet (10 mg total) by mouth daily before breakfast. 10/02/21  Yes Early Osmond, MD  ?fluticasone (FLONASE) 50 MCG/ACT nasal spray Place 2 sprays into both  nostrils in the morning.   Yes [provider]  ?Homeopathic Products (MUSCLE THERAPY/ARNICA) GEL Apply 1 application. topically 3 (three) times daily as needed (knee pain.).   Yes [provider]  ?ibuprofen (ADVIL) 200 MG tablet Take 800 mg by mouth in the morning, at noon, in the evening, and at bedtime.   Yes [provider]  ?isosorbide mononitrate (IMDUR) 30 MG 24 hr tablet Take 1 tablet (30 mg total) by mouth daily. ?Patient taking differently: Take 60 mg by mouth every evening. 10/02/21  Yes Early Osmond, MD  ?lisinopril (ZESTRIL) 10 MG tablet Take 1 tablet (10 mg total) by mouth daily. ?Patient taking differently: Take 10 mg by mouth every evening. 10/20/21  Yes Early Osmond, MD  ?metFORMIN (GLUCOPHAGE-XR) 500 MG 24 hr tablet Take 500 mg by mouth 2 (two) times daily. 11/14/21  Yes [provider]  ?metoprolol tartrate (LOPRESSOR) 25 MG tablet Take 1 tablet (25 mg total) by mouth 2 (two) times daily. 10/02/21  Yes Early Osmond, MD  ?nitroGLYCERIN (NITROSTAT) 0.4 MG SL tablet Place 1 tablet (0.4 mg total) under the tongue every 5 (five) minutes as needed for chest pain. 10/02/21 12/31/21 Yes Early Osmond, MD  ?pantoprazole (PROTONIX) 40 MG tablet Take 1 tablet (40 mg total) by mouth daily. ?Patient taking differently: Take 40 mg by mouth every evening. 10/02/21  Yes Early Osmond, MD  ?Continuous Blood Gluc Receiver (DEXCOM G6 RECEIVER) DEVI 1 Device by Does not apply route continuous. 05/28/21   de [place],  Blondell Reveal, MD  ?Continuous Blood Gluc Sensor (DEXCOM G6 SENSOR) MISC 3 Devices by Does not apply route continuous. 05/28/21   de [place], Raymond J, MD  ?Continuous Blood Gluc Transmit (DEXCOM G6 TRANSMITTER) MISC 1 Device by Does not apply route continuous. 05/28/21   de [place], Raymond J, MD  ?metoprolol tartrate (LOPRESSOR) 100 MG tablet Take 1 tablet by mouth 2 hours before your CT ?Patient not taking: Reported on 12/02/2021 10/02/21   Early Osmond, MD  ?   ? ?Allergies:    ?Allergies  ?Allergen Reactions  ? Alprazolam Other (See Comments)  ?  Caused mood changes  ? Citalopram Hydrobromide Other (See Comments)  ?  Mood changes  ? Nitrofurantoin Monohyd Macro Nausea And Vomiting  ? Statins Other (See Comments)  ?  MUSCLE LOSS   ? Sulfa Drugs Cross Reactors Nausea And Vomiting  ? ? ?Social History:   ?Social History  ? ?Socioeconomic History  ? Marital status: Married  ?  Spouse name: Not on file  ? Number of children: Not on file  ? Years of education: Not on file  ? Highest education level: Not on file  ?Occupational History  ? Occupation: Civil engineer, contracting  ?Tobacco Use  ? Smoking status: Former  ?  Years: 20.00  ?  Types: Cigarettes  ?  Quit date: 09/14/1997  ?  Years since quitting: 24.2  ? Smokeless tobacco: Never  ?Substance and Sexual Activity  ? Alcohol use: No  ? Drug use: No  ? Sexual activity: Yes  ?  Partners: Male  ?Other Topics Concern  ? Not on file  ?Social History Narrative  ? Exercise-- none  ? ?Social Determinants of Health  ? ?Financial Resource Strain: Not on file  ?Food Insecurity: Not on file  ?Transportation Needs: Not on file  ?Physical Activity: Not on file  ?Stress: Not on file  ?Social Connections: Not on file  ?Intimate Partner Violence: Not on file  ?  ?Family History:   ?The patient's family history includes Diabetes in her mother and paternal grandfather; Heart disease in her maternal grandfather and maternal grandmother; Hyperlipidemia in her maternal grandfather, maternal grandmother, and mother; Hypertension in her mother and paternal grandfather; Ovarian cancer in her paternal grandmother; Stroke in her paternal grandfather; Sudden death in her maternal uncle; Transient ischemic attack in her father and mother.   ? ?ROS:  ?Please see the history of present illness.  ?All other ROS reviewed and negative.    ? ?Physical Exam/Data:  ? ?Vitals:  ? 12/05/21 0626  ?BP: (!) 148/44  ?Pulse: 78  ?Resp: 16  ?Temp: 98.1 ?F  (36.7 ?C)  ?TempSrc: Oral  ?SpO2: 96%  ?Weight: (!) 145.2 kg  ?Height: 5\' 6"  (1.676 m)  ? ?No intake or output data in the 24 hours ending 12/05/21 0642 ? ?  12/05/2021  ?  6:26 AM 10/02/2021  ? 10:01 AM 06/19/2021  ?  3:27 PM  ?Last 3 Weights  ?Weight (lbs) 320 lb 328 lb 323 lb 12.8 oz  ?Weight (kg) 145.151 kg 148.78 kg 146.875 kg  ?   ?Body mass index is 51.65 kg/m?.  ?General:  Well nourished, well developed, in no acute distress ?HEENT: normal; xanthelasmas present ?Neck: no JVD ?Vascular: No carotid bruits; Distal pulses 2+ bilaterally   ?Cardiac:  normal S1, S2; RRR; no murmur  ?Lungs:  clear to auscultation bilaterally, no wheezing, rhonchi or rales  ?Abd: soft, nontender, no hepatomegaly  ?Ext: no edema ?Musculoskeletal:  No  deformities, BUE and BLE strength normal and equal ?Skin: warm and dry  ?Neuro:  CNs 2-12 intact, no focal abnormalities noted ?Psych:  Normal affect  ? ? ?EKG:  The ECG that was done January 2023 was personally reviewed and demonstrates sinus rhythm with anteroseptal MI pattern ? ?Relevant CV Studies: ?CT 3/23 ?1. Coronary CTA FFR analysis demonstrates a possible hemodynamically ?flow limiting lesion in the mid LAD (FFR 0.87>>0.66) although no ?obvious calcified plaque was present in this region and the mid and ?distal LAD were not well visualized due to significant noise ?artifact limiting ability to detect soft plaque. There was a ?possible moderate stenosis in the mid RCA on CTA but the RCA could ?not be modeled on FFR. ?  ?2. Consider nuclear stress testing to assess for ischemia in the RCA ?and LAD territories or cardiac cath. ? ?TTE 1/23 ?1. Left ventricular ejection fraction, by estimation, is 60 to 65%. The  ?left ventricle has normal function. The left ventricle has no regional  ?wall motion abnormalities. There is mild concentric left ventricular  ?hypertrophy. Left ventricular diastolic  ?parameters are indeterminate. The average left ventricular global  ?longitudinal strain is  -17.0 %. The global longitudinal strain is normal.  ? 2. Right ventricular systolic function is normal. The right ventricular  ?size is normal. There is normal pulmonary artery systolic pressure.  ? 3. Left atri

## 2021-12-05 NOTE — H&P (Signed)
?History and Physical  ? ? ?Patient: Penny Huerta [id number] DOB: 01/21/1966 ?DOA: 12/05/2021 ?DOS: the patient was seen and examined on 12/05/2021 ?PCP: Katherina Mires, MD  ?Patient coming from: Home - lives with husband; NOK: Rachell Cipro, [phone] ? ? ?Chief Complaint: Floaters ? ?HPI: Penny Huerta is a 56 y.o. female with medical history significant of afib not on AC; DM; morbid obesity; HTN; and HLD who presented today for cath due to exertional angina and +coronary CTA.   Post-cath she noticed floaters in both eyes.  The floaters resolved and then recurred "pretty strongly" and then resolved again.  She had a headache and thinks it was migraine.  She doesn't usually get migraines, has occasional "wavy vision" with headaches.  Maybe a dozen lifetime headaches.  She currently has a frontal headache and her sinuses are bothering her now - she did not take Flonase today.  No other neurologic symptoms. ? ?Cath was due to substernal CP present since January.  She had COVID and has been going to PT and was trying to progress but she was so out of breath with minimal exertion that she was very frustrated.  She walked upstairs one day and thought she was calling 911.  She had initial ER evaluation on 1/17 and was referred to cards, started on isosorbide.  She had an echo and then a coronary CTA on 3/14.  Based on the CTA she was scheduled for cath today. ? ?Every once in a while her heart skips beat.  She used to take Mag and it was fine.  It hasn't been an issue much recently.  She was never started on blood thinners, has been taking 81 mg ASA daily. ? ? ? ?ER Course:  Cath today.  After procedure, she had floaters.  Code stroke called, head CT done.  Needs CVA work-up.  Cardiology and neurology will follow, recommends observation. ? ? ? ? ?Review of Systems: As mentioned in the history of present illness. All other systems reviewed and are negative. ?Past Medical History:  ?Diagnosis Date  ? Allergy   ?  Anxiety   ? Atrial fibrillation ([place])   ? Diabetes mellitus without complication ([place])   ? Heart murmur   ? ARRHYTHMIA  ? Hyperlipidemia   ? Hypertension   ? ?Past Surgical History:  ?Procedure Laterality Date  ? HYSTEROSCOPY W/ ENDOMETRIAL ABLATION  2011  ? LEFT HEART CATH AND CORONARY ANGIOGRAPHY N/A 12/05/2021  ? Procedure: LEFT HEART CATH AND CORONARY ANGIOGRAPHY;  Surgeon: Early Osmond, MD;  Location: [facility];  Service: Cardiovascular;  Laterality: N/A;  ? ?Social History:  reports that she quit smoking about 24 years ago. Her smoking use included cigarettes. She has never used smokeless tobacco. She reports that she does not currently use alcohol. She reports that she does not currently use drugs. ? ?Allergies  ?Allergen Reactions  ? Alprazolam Other (See Comments)  ?  Caused mood changes  ? Citalopram Hydrobromide Other (See Comments)  ?  Mood changes  ? Nitrofurantoin Monohyd Macro Nausea And Vomiting  ? Statins Other (See Comments)  ?  MUSCLE LOSS   ? Sulfa Drugs Cross Reactors Nausea And Vomiting  ? ? ?Family History  ?Problem Relation Age of Onset  ? Hyperlipidemia Mother   ? Transient ischemic attack Mother   ? Hypertension Mother   ? Diabetes Mother   ? Transient ischemic attack Father   ? Heart disease Maternal Grandmother 26  ? Hyperlipidemia Maternal  Grandmother   ? Hyperlipidemia Maternal Grandfather   ? Heart disease Maternal Grandfather   ? Ovarian cancer Paternal Grandmother   ? Stroke Paternal Grandfather   ? Hypertension Paternal Grandfather   ? Diabetes Paternal Grandfather   ? Sudden death Maternal Uncle   ? ? ?Prior to Admission medications   ?Medication Sig Start Date End Date Taking? Authorizing Provider  ?aspirin EC 81 MG tablet Take 81 mg by mouth in the morning. Swallow whole.   Yes [provider]  ?empagliflozin (JARDIANCE) 10 MG TABS tablet Take 1 tablet (10 mg total) by mouth daily before breakfast. 10/02/21  Yes Early Osmond, MD  ?fluticasone (FLONASE) 50  MCG/ACT nasal spray Place 2 sprays into both nostrils in the morning.   Yes [provider]  ?Homeopathic Products (MUSCLE THERAPY/ARNICA) GEL Apply 1 application. topically 3 (three) times daily as needed (knee pain.).   Yes [provider]  ?ibuprofen (ADVIL) 200 MG tablet Take 800 mg by mouth in the morning, at noon, in the evening, and at bedtime.   Yes [provider]  ?isosorbide mononitrate (IMDUR) 30 MG 24 hr tablet Take 1 tablet (30 mg total) by mouth daily. ?Patient taking differently: Take 60 mg by mouth every evening. 10/02/21  Yes Early Osmond, MD  ?lisinopril (ZESTRIL) 10 MG tablet Take 1 tablet (10 mg total) by mouth daily. ?Patient taking differently: Take 10 mg by mouth every evening. 10/20/21  Yes Early Osmond, MD  ?metFORMIN (GLUCOPHAGE-XR) 500 MG 24 hr tablet Take 500 mg by mouth 2 (two) times daily. 11/14/21  Yes [provider]  ?metoprolol tartrate (LOPRESSOR) 25 MG tablet Take 1 tablet (25 mg total) by mouth 2 (two) times daily. 10/02/21  Yes Early Osmond, MD  ?nitroGLYCERIN (NITROSTAT) 0.4 MG SL tablet Place 1 tablet (0.4 mg total) under the tongue every 5 (five) minutes as needed for chest pain. 10/02/21 12/31/21 Yes Early Osmond, MD  ?pantoprazole (PROTONIX) 40 MG tablet Take 1 tablet (40 mg total) by mouth daily. ?Patient taking differently: Take 40 mg by mouth every evening. 10/02/21  Yes Early Osmond, MD  ?Continuous Blood Gluc Receiver (DEXCOM G6 RECEIVER) DEVI 1 Device by Does not apply route continuous. 05/28/21   de [place], Raymond J, MD  ?Continuous Blood Gluc Sensor (DEXCOM G6 SENSOR) MISC 3 Devices by Does not apply route continuous. 05/28/21   de [place], Raymond J, MD  ?Continuous Blood Gluc Transmit (DEXCOM G6 TRANSMITTER) MISC 1 Device by Does not apply route continuous. 05/28/21   de [place], Raymond J, MD  ?metoprolol tartrate (LOPRESSOR) 100 MG tablet Take 1 tablet by mouth 2 hours before your CT ?Patient not taking: Reported on  12/02/2021 10/02/21   Early Osmond, MD  ? ? ?Physical Exam: ?Vitals:  ? 12/05/21 1114 12/05/21 1238 12/05/21 1457 12/05/21 1617  ?BP: (!) 137/57 94/77 133/68 125/88  ?Pulse: 88 97 (!) 103 (!) 103  ?Resp:  18 20 20   ?Temp:  97.9 ?F (36.6 ?C) 97.9 ?F (36.6 ?C) 98.7 ?F (37.1 ?C)  ?TempSrc:  Oral Oral Oral  ?SpO2: 97% 93% 95% 95%  ?Weight:      ?Height:      ? ?General:  Appears calm and comfortable and is in NAD ?Eyes:  PERRL, EOMI, normal lids, iris ?ENT:  grossly normal hearing, lips & tongue, mmm; appropriate dentition ?Neck:  no LAD, masses or thyromegaly ?Cardiovascular:  RRR, no m/r/g. No LE edema.  ?Respiratory:   CTA bilaterally  with no wheezes/rales/rhonchi.  Normal respiratory effort. ?Abdomen:  soft, NT, ND ?Skin:  no rash or induration seen on limited exam ?Musculoskeletal:  grossly normal tone BUE/BLE, good ROM, no bony abnormality ?Psychiatric:  mildly anxious/labile mood and affect, speech fluent and appropriate, AOx3 ?Neurologic:  CN 2-12 grossly intact, moves all extremities in coordinated fashion, sensation intact ? ? ?Radiological Exams on Admission: ?Independently reviewed - see discussion in A/P where applicable ? ?CARDIAC CATHETERIZATION ? ?Result Date: 12/05/2021 ?  Ost RCA lesion is 70% stenosed.   Mid RCA lesion is 60% stenosed.   RPDA lesion is 95% stenosed.   2nd RPL lesion is 99% stenosed.   Prox LAD lesion is 80% stenosed.   LV end diastolic pressure is moderately elevated.   The left ventricular ejection fraction is 55-65% by visual estimate. 1.  Multivessel coronary artery disease consisting of ostial right coronary artery, mid right coronary artery, right PDA, R PLV, and mid LAD disease.  The patient will be referred to cardiothoracic surgery for further recommendations. 2.  LVEDP of 21 mmHg with preserved LV function.  ? ?CT HEAD CODE STROKE WO CONTRAST ? ?Addendum Date: 12/05/2021   ?ADDENDUM REPORT: 12/05/2021 10:31 ADDENDUM: These results were called by telephone at the time of  interpretation on 12/05/2021 at 9:12 am to provider Dr. Theda Sers , who verbally acknowledged these results. Electronically Signed   By: Kellie Simmering D.O.   On: 12/05/2021 10:31  ? ?Result Date: 12/05/2021 ?CLINICAL DATA

## 2021-12-05 NOTE — Progress Notes (Signed)
Patient passed her [place]. SS RN updated. Leatrice Parilla, Rande Brunt, RN   ?

## 2021-12-05 NOTE — Progress Notes (Signed)
Client to CT scan ?

## 2021-12-05 NOTE — Progress Notes (Signed)
Client c/o seeing black spots and Dr Ali Lowe in to see client and per Dr Ali Lowe code stroke called ?

## 2021-12-05 NOTE — Code Documentation (Addendum)
Stroke Response Nurse Documentation ?Code Documentation ? ?Penny Huerta is a 56 y.o. female admitted to [facility]  on 12/05/21 for cardiac cath procedure with past medical hx of HTN, HLD, DM, migraines, chest pain. On aspirin 81 mg daily. Code stroke was activated by Short Stay RN .  ? ?Patient on short stay unit where she was LKW at [place] and now complaining of "floaters" in her visual field. Patient had just arrived back to short stay with a TR band after her cardiac cath procedure. The RN got her on the monitor and into the chair when she suddenly had changes in her vision. Patient does say this is similar with her migraines. She does have a 4/10 headache. MD was notified and told RN to call code stroke.   ? ?Stroke team at the bedside after patient activation. Patient to CT with SRN and bedside RN. NIHSS 0, see documentation for details and code stroke times. Patient with NIHSS 0 on exam. The following imaging was completed:  CT Head. Patient is not a candidate for IV Thrombolytic due to stroke not suspected and symptoms resolved. MD believes this could be a migraine syndrome or reaction to the contrast post-procedure.  During CT scan patient's vision was back to baseline. Patient is not a candidate for IR due to stroke not suspected. Code stroke was cancelled and patient was taken back to room. She will be discharged today and given the signs and symptoms of stroke and when to call 911.   ? ?Care/Plan: Discharge instructions with RN.  ? ?Bedside handoff with RN Threasa Beards.   ? ?Addendum:  ?  ?MD has reconsidered doing a full stroke workup and admitting the patient for observation after reviewing her labs and cardiac hx/concerns. Spoke with short stay. I will do the Q4 neuro checks until she is admitted to the floor. Will also do the yale swallow screen before pt is given anything PO. Threasa Beards, RN from [employer] updated.  ?

## 2021-12-05 NOTE — Progress Notes (Signed)
Back from CT scan

## 2021-12-05 NOTE — Assessment & Plan Note (Addendum)
TIA vs complex migraine ?Head CT 3/24 with age indeterminate 14 mm infarct at junction of R cuadate nucleus and R corona radiata/internal capsule, lacunar infarct within L basal ganglia/internal capsule - no acute intracranial hemorrhage or acute demarcated cortical infarction ?MRA head with advanced intracranial atherosclerosis with widespread stenoses  ?MRI brain deferred yesterday due to anxiety during procedure ?CTA with 70% stenosis on L and severe/non measurable on R - not clear whether these symptomatic per my discussion with neuro -> will refer to vascular outpatient ?Planning for repeat MRI brain today with MRA neck as well - didn't tolerate repeat MRI, referring to neurology and vascular outpatient ?Echo 10/14/2021 with Ef 60-65% (see report) ?LDL 227, a1c 6 ?Holding lisinopril, metop for permissive htn ?PT/OT ?SLP ?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 Debara Pickett, would benefit from [facility].  Starting zetia.   ?Currently on aspirin/plavix per neurology -> DAPTx3 months, then plavix alone ?

## 2021-12-06 ENCOUNTER — Observation Stay (HOSPITAL_COMMUNITY): Payer: BC Managed Care – PPO

## 2021-12-06 DIAGNOSIS — G459 Transient cerebral ischemic attack, unspecified: Secondary | ICD-10-CM | POA: Diagnosis present

## 2021-12-06 DIAGNOSIS — Z6841 Body Mass Index (BMI) 40.0 and over, adult: Secondary | ICD-10-CM

## 2021-12-06 DIAGNOSIS — Z83438 Family history of other disorder of lipoprotein metabolism and other lipidemia: Secondary | ICD-10-CM | POA: Diagnosis not present

## 2021-12-06 DIAGNOSIS — E7849 Other hyperlipidemia: Secondary | ICD-10-CM

## 2021-12-06 DIAGNOSIS — E119 Type 2 diabetes mellitus without complications: Secondary | ICD-10-CM | POA: Diagnosis present

## 2021-12-06 DIAGNOSIS — Z833 Family history of diabetes mellitus: Secondary | ICD-10-CM | POA: Diagnosis not present

## 2021-12-06 DIAGNOSIS — Z87891 Personal history of nicotine dependence: Secondary | ICD-10-CM | POA: Diagnosis not present

## 2021-12-06 DIAGNOSIS — Z888 Allergy status to other drugs, medicaments and biological substances status: Secondary | ICD-10-CM | POA: Diagnosis not present

## 2021-12-06 DIAGNOSIS — I6523 Occlusion and stenosis of bilateral carotid arteries: Secondary | ICD-10-CM

## 2021-12-06 DIAGNOSIS — M6080 Other myositis, unspecified site: Secondary | ICD-10-CM | POA: Diagnosis present

## 2021-12-06 DIAGNOSIS — E7801 Familial hypercholesterolemia: Secondary | ICD-10-CM | POA: Diagnosis present

## 2021-12-06 DIAGNOSIS — G43109 Migraine with aura, not intractable, without status migrainosus: Secondary | ICD-10-CM | POA: Diagnosis present

## 2021-12-06 DIAGNOSIS — I10 Essential (primary) hypertension: Secondary | ICD-10-CM | POA: Diagnosis present

## 2021-12-06 DIAGNOSIS — Z823 Family history of stroke: Secondary | ICD-10-CM | POA: Diagnosis not present

## 2021-12-06 DIAGNOSIS — Z7984 Long term (current) use of oral hypoglycemic drugs: Secondary | ICD-10-CM | POA: Diagnosis not present

## 2021-12-06 DIAGNOSIS — R2990 Unspecified symptoms and signs involving the nervous system: Principal | ICD-10-CM | POA: Diagnosis present

## 2021-12-06 DIAGNOSIS — I25119 Atherosclerotic heart disease of native coronary artery with unspecified angina pectoris: Secondary | ICD-10-CM | POA: Diagnosis present

## 2021-12-06 DIAGNOSIS — Z79899 Other long term (current) drug therapy: Secondary | ICD-10-CM | POA: Diagnosis not present

## 2021-12-06 DIAGNOSIS — Z882 Allergy status to sulfonamides status: Secondary | ICD-10-CM | POA: Diagnosis not present

## 2021-12-06 DIAGNOSIS — Z8249 Family history of ischemic heart disease and other diseases of the circulatory system: Secondary | ICD-10-CM | POA: Diagnosis not present

## 2021-12-06 DIAGNOSIS — Z8616 Personal history of COVID-19: Secondary | ICD-10-CM | POA: Diagnosis not present

## 2021-12-06 DIAGNOSIS — I480 Paroxysmal atrial fibrillation: Secondary | ICD-10-CM | POA: Diagnosis present

## 2021-12-06 LAB — LIPID PANEL
Cholesterol: 314 mg/dL — ABNORMAL HIGH (ref 0–200)
HDL: 25 mg/dL — ABNORMAL LOW (ref >40)
LDL Cholesterol: 227 mg/dL — ABNORMAL HIGH (ref 0–99)
Total CHOL/HDL Ratio: 12.6 RATIO
Triglycerides: 312 mg/dL — ABNORMAL HIGH (ref <150)
VLDL: 62 mg/dL — ABNORMAL HIGH (ref 0–40)

## 2021-12-06 LAB — GLUCOSE, CAPILLARY
Glucose-Capillary: 124 mg/dL — ABNORMAL HIGH (ref 70–99)
Glucose-Capillary: 106 mg/dL — ABNORMAL HIGH (ref 70–99)

## 2021-12-06 LAB — HEMOGLOBIN A1C
Hgb A1c MFr Bld: 6 % — ABNORMAL HIGH (ref 4.8–5.6)
Mean Plasma Glucose: 125.5 mg/dL

## 2021-12-06 LAB — HIV ANTIBODY (ROUTINE TESTING W REFLEX): HIV Screen 4th Generation wRfx: NONREACTIVE

## 2021-12-06 MED ORDER — EZETIMIBE 10 MG PO TABS
10.0000 mg | ORAL_TABLET | Freq: Every day | ORAL | Status: DC
Start: 2021-12-06 — End: 2021-12-07
  Administered 2021-12-06: 10 mg via ORAL
  Filled 2021-12-06: qty 1

## 2021-12-06 MED ORDER — ASPIRIN EC 81 MG PO TBEC
81.0000 mg | DELAYED_RELEASE_TABLET | Freq: Every day | ORAL | Status: DC
  Administered 2021-12-06: 81 mg via ORAL
  Filled 2021-12-06: qty 1

## 2021-12-06 MED ORDER — EZETIMIBE 10 MG PO TABS: 10.0000 mg | ORAL_TABLET | Freq: Every day | ORAL | 1 refills | Status: DC

## 2021-12-06 MED ORDER — CLOPIDOGREL BISULFATE 75 MG PO TABS
75.0000 mg | ORAL_TABLET | Freq: Every day | ORAL | Status: DC
  Administered 2021-12-06: 75 mg via ORAL
  Filled 2021-12-06: qty 1

## 2021-12-06 MED ORDER — DIAZEPAM 5 MG/ML IJ SOLN
5.0000 mg | Freq: Once | INTRAMUSCULAR | Status: AC
  Administered 2021-12-06: 5 mg via INTRAVENOUS
  Filled 2021-12-06: qty 2

## 2021-12-06 MED ORDER — ASPIRIN EC 81 MG PO TBEC: 81.0000 mg | DELAYED_RELEASE_TABLET | Freq: Every morning | ORAL | 0 refills | Status: AC

## 2021-12-06 MED ORDER — LORAZEPAM 2 MG/ML IJ SOLN: 2.0000 mg | Freq: Once | INTRAMUSCULAR | Status: DC | PRN

## 2021-12-06 MED ORDER — CLOPIDOGREL BISULFATE 75 MG PO TABS
75.0000 mg | ORAL_TABLET | Freq: Every day | ORAL | 2 refills | Status: AC
Start: 2021-12-07 — End: 2022-01-06

## 2021-12-06 MED ORDER — DIAZEPAM 5 MG/ML IJ SOLN: 5.0000 mg | Freq: Once | INTRAMUSCULAR | Status: DC

## 2021-12-06 NOTE — Evaluation (Signed)
Occupational Therapy Evaluation ?Patient Details ?Name: Penny Huerta ?MRN: [id number] ?DOB: Mar 18, 1966 ?Today's Date: 12/06/2021 ? ? ?History of Present Illness Pt is 56 yo female who presented on 12/05/21 for cardiac cath, during recovery developed floaters in her periphery - Code stroke called.  Pt has had similar symptoms with migraines. CT of head did reveal 14 mm infarct at junction of R caudate nucleus and R corona radiata/internal capsule and L basal ganglia (both new since last CT 2007 but age indeterminate).  Pt with hx of  HTN, HLD, PAF ( not on AC), morbid obesity, DM, anxiety/depression and migraines  ? ?Clinical Impression ?  ?Penny Huerta was evaluated s/p the above admission list, she is mod I at baseline with use of [facility] for longer distances outside of the home. She works, drives and lives with her husband who also works full time. Upon evaluation pt demonstrated mod I ability to complete ADLs and functional mobility. Her vision, strength, ROM and sensation were all WFL. Pt is at her functional baseline. Pt does not need further OT, recommend d/c to home.  ?  ? ?Recommendations for follow up therapy are one component of a multi-disciplinary discharge planning process, led by the attending physician.  Recommendations may be updated based on patient status, additional functional criteria and insurance authorization.  ? ?Follow Up Recommendations ? No OT follow up  ?  ?Assistance Recommended at Discharge PRN  ?Patient can return home with the following Assist for transportation ? ?  ?Functional Status Assessment ? Patient has had a recent decline in their functional status and demonstrates the ability to make significant improvements in function in a reasonable and predictable amount of time.  ?Equipment Recommendations ? None recommended by OT (discussed toilet riser options, pt defered for now.)  ?  ?   ?Precautions / Restrictions Precautions ?Precautions: None ?Restrictions ?Weight Bearing Restrictions: No  ? ?   ? ?Mobility Bed Mobility ?Overal bed mobility: Independent ?  ?  ?  ?  ?  ?  ?  ?  ? ?Transfers ?Overall transfer level: Independent ?Equipment used: None ?  ?  ?  ?  ?  ?  ?  ?  ?  ? ?  ?Balance Overall balance assessment: Needs assistance ?Sitting-balance support: No upper extremity supported ?Sitting balance-Leahy Scale: Normal ?  ?  ?Standing balance support: No upper extremity supported, Single extremity supported ?Standing balance-Leahy Scale: Good ?Standing balance comment: uses cane for longer distance ?  ?  ?  ?  ?  ?  ?  ?  ?  ?  ?  ?   ? ?ADL either performed or assessed with clinical judgement  ? ?ADL Overall ADL's : Modified independent ?  ?  ?  ?  ?General ADL Comments: At mod I baseline with use of SPC.  ? ? ? ?Vision Baseline Vision/History: 0 No visual deficits ?Vision Assessment?: No apparent visual deficits ?Additional Comments: assessment was WFL  ?   ?   ?   ? ?Pertinent Vitals/Pain Pain Assessment ?Pain Assessment: No/denies pain  ? ? ? ?Hand Dominance Right ?  ?Extremity/Trunk Assessment Upper Extremity Assessment ?Upper Extremity Assessment: Overall WFL for tasks assessed ?  ?Lower Extremity Assessment ?Lower Extremity Assessment: Overall WFL for tasks assessed ?  ?Cervical / Trunk Assessment ?Cervical / Trunk Assessment: Normal ?  ?Communication Communication ?Communication: No difficulties ?  ?Cognition Arousal/Alertness: Awake/alert ?Behavior During Therapy: [facility] for tasks assessed/performed ?Overall Cognitive Status: Within Functional Limits for tasks assessed ?  ?  ?  ?  ?  General Comments: good recall of [facility] after review ?  ?  ?General Comments  VSS on RA, husband present and supportive ? ?  ? ?Home Living Family/patient expects to be discharged to:: Private residence ?Living Arrangements: Spouse/significant other ?Available Help at Discharge: Family;Available PRN/intermittently ?Type of Home: House ?Home Access: Stairs to enter ?Entrance Stairs-Number of Steps: 5 ?Entrance  Stairs-Rails: None ?Home Layout: Two level;Bed/bath upstairs;1/2 bath on main level ?Alternate Level Stairs-Number of Steps: 14 ?Alternate Level Stairs-Rails: Left ?Bathroom Shower/Tub: Tub/shower unit;Walk-in shower ?  ?Bathroom Toilet: Standard ?  ?  ?Home Equipment: Cane - single Barista (2 wheels) ?  ?  ?  ? ?  ?Prior Functioning/Environment Prior Level of Function : Independent/Modified Independent;Working/employed ?  ?  ?  ?  ?  ?  ?Mobility Comments: Ambulates without AD in home and with cane in community ?ADLs Comments: Independent with ADLS and IADLs; works as Glass blower/designer ?  ? ?  ?  ?OT Problem List: Decreased activity tolerance ?  ?   ?OT Treatment/Interventions:    ?  ?OT Goals(Current goals can be found in the care plan section) Acute Rehab OT Goals ?Patient Stated Goal: home soon ?OT Goal Formulation: All assessment and education complete, DC therapy  ? ?AM-PAC OT "6 Clicks" Daily Activity     ?Outcome Measure Help from another person eating meals?: None ?Help from another person taking care of personal grooming?: None ?Help from another person toileting, which includes using toliet, bedpan, or urinal?: None ?Help from another person bathing (including washing, rinsing, drying)?: None ?Help from another person to put on and taking off regular upper body clothing?: None ?Help from another person to put on and taking off regular lower body clothing?: None ?6 Click Score: 24 ?  ?End of Session Nurse Communication: Mobility status ? ?Activity Tolerance: Patient tolerated treatment well ?Patient left: in bed;with family/visitor present ? ?OT Visit Diagnosis: Hemiplegia and hemiparesis ?Hemiplegia - Right/Left:  (resolved) ?Hemiplegia - caused by: Unspecified (TIA - resolved)  ?              ?Time: [id number] ?OT Time Calculation (min): 16 min ?Charges:  OT General Charges ?$OT Visit: 1 Visit ?OT Evaluation ?$OT Eval Low Complexity: 1 Low ? ? ?Tenille Morrill A Philip Kotlyar ?12/06/2021, 10:45 AM ?

## 2021-12-06 NOTE — Progress Notes (Addendum)
STROKE TEAM PROGRESS NOTE  ? ?INTERVAL HISTORY ?Patient is seen in her room with her husband at the bedside.  Yesterday, she underwent cardiac cath and afterwards noted multiple floaters in her vision.  She had a headache all day yesterday.  Her symptoms resolved spontaneously, and she has been admitted for TIA workup. ? ?Vitals:  ? 12/05/21 1941 12/05/21 2323 12/06/21 0340 12/06/21 0731  ?BP: 134/66 128/68 121/82 134/69  ?Pulse: 98  90 89  ?Resp: 17 17 15 18   ?Temp: 98.9 ?F (37.2 ?C) 98.7 ?F (37.1 ?C) 98 ?F (36.7 ?C) 98.2 ?F (36.8 ?C)  ?TempSrc: Oral Oral Oral Oral  ?SpO2: 95%  94% 96%  ?Weight:      ?Height:      ? ?CBC:  ?Recent Labs  ?Lab 12/02/21 ?[id number]  ?WBC 10.6  ?HGB 12.8  ?HCT 38.3  ?MCV 85  ?PLT 341  ? ?Basic Metabolic Panel:  ?Recent Labs  ?Lab 12/02/21 ?[id number]  ?NA 139  ?K 4.3  ?CL 104  ?CO2 23  ?GLUCOSE 113*  ?BUN 20  ?CREATININE 0.74  ?CALCIUM 9.8  ? ?Lipid Panel:  ?Recent Labs  ?Lab 12/06/21 ?0742  ?CHOL 314*  ?TRIG 312*  ?HDL 25*  ?CHOLHDL 12.6  ?VLDL 62*  ?[place] 227*  ? ?HgbA1c:  ?Recent Labs  ?Lab 12/06/21 ?0742  ?HGBA1C 6.0*  ? ?Urine Drug Screen:  ?Recent Labs  ?Lab 12/05/21 ?1435  ?LABOPIA NONE DETECTED  ?COCAINSCRNUR NONE DETECTED  ?LABBENZ POSITIVE*  ?AMPHETMU NONE DETECTED  ?THCU NONE DETECTED  ?LABBARB NONE DETECTED  ?  ?Alcohol Level No results for input(s): ETH in the last 168 hours. ? ?IMAGING past 24 hours ?MR ANGIO HEAD WO CONTRAST ? ?Result Date: 12/06/2021 ?CLINICAL DATA:  TIA. EXAM: MRA HEAD WITHOUT CONTRAST TECHNIQUE: Angiographic images of the Circle of Willis were acquired using MRA technique without intravenous contrast. COMPARISON:  CTA of the neck from yesterday FINDINGS: Anterior circulation: Diffuse atheromatous irregularity. Hypoplastic appearance of the right A1 segment. High-grade narrowing seen at bilateral M2 branches with mild to moderate left M1 segment stenosis. High-grade left ACA origin narrowing. No detected aneurysm when allowing for atheromatous irregularity of the  carotid siphons Posterior circulation: Diffuse atheromatous irregularity of the vertebral and basilar arteries. The right vertebral artery is dominant. High-grade right proximal PCA narrowing. Moderate to advanced left P2 segment stenosis. Anatomic variants: As above IMPRESSION: Advanced intracranial atherosclerosis with widespread stenoses, narrowings most advanced at the bilateral M2, left A1, and right PCA. Electronically Signed   By: Jorje Guild M.D.   On: 12/06/2021 05:30  ? ?CT ANGIO NECK CODE STROKE ? ?Result Date: 12/05/2021 ?CLINICAL DATA:  Neuro deficit with stroke suspected EXAM: CT ANGIOGRAPHY NECK TECHNIQUE: Multidetector CT imaging of the neck was performed using the standard protocol during bolus administration of intravenous contrast. Multiplanar CT image reconstructions and MIPs were obtained to evaluate the vascular anatomy. Carotid stenosis measurements (when applicable) are obtained utilizing NASCET criteria, using the distal internal carotid diameter as the denominator. RADIATION DOSE REDUCTION: This exam was performed according to the departmental dose-optimization program which includes automated exposure control, adjustment of the mA and/or kV according to patient size and/or use of iterative reconstruction technique. CONTRAST:  14mL OMNIPAQUE IOHEXOL 350 MG/ML SOLN COMPARISON:  None. FINDINGS: Aortic arch: Atheromatous plaque.  Three vessel branching. Right carotid system: Mixed density plaque at the bifurcation with severe stenosis, proximal ICA cannot be accurately measured due to small size and due to patient and technical factors. Downstream underfilling compared to  the left. Left carotid system: Extensively plaque at the bifurcation with high-grade proximal ICA narrowing measuring at least 70% as measured on axial images. No dissection or beading. Vertebral arteries: No proximal subclavian stenosis. Calcified plaque at the origin of the right vertebral artery, which is dominant.  Poorly visualized left vertebral artery primarily due to patient factors Skeleton: No acute finding Other neck: No acute finding Upper chest: No acute finding. The patient's scan continues through the circle-of-Willis and there is severe right M2 and moderate left M1 segment stenoses. Very faint flow is seen beyond the worst right M2 stenosis, per chart patient symptoms have resolved and emergent branch occlusion is not expected. Diffuse medium vessel atheromatous irregularity with a distal left M2 branch stenosis that is advanced on reformats. The degree of severity is likely accentuated by technique. Presumably hypoplastic right A1 segment, although potentially severely diseased in this case. Severe generalized PCA atheromatous type irregularity and narrowing, especially advanced at the right P1 segment. IMPRESSION: 1. Suboptimal study but still positive for flow limiting stenosis at the bilateral proximal ICA due to atheromatous plaque, at least 70% on the left and severe/non-measurable on the right. 2. Continuation through the circle-of-Willis shows advanced intracranial stenosis with advanced bilateral PCA and M2 narrowings. Electronically Signed   By: Tiburcio Pea M.D.   On: 12/05/2021 19:49   ? ?PHYSICAL EXAM ? ?NEURO:  ?Mental Status: AA&Ox3  ?Speech/Language: speech is without dysarthria or aphasia.  Naming, repetition, fluency, and comprehension intact. ? ?Cranial Nerves:  ?II: PERRL. Visual fields full.  ?III, IV, VI: EOMI. Eyelids elevate symmetrically.  ?V: Sensation is intact to light touch and symmetrical to face.  ?VII: Smile is symmetrical.  ?VIII: hearing intact to voice. ?IX, X:  Phonation is normal.  ?XII: tongue is midline without fasciculations. ?Motor: 5/5 strength to all muscle groups tested.  ?Tone: is normal and bulk is normal ?Sensation- Intact to light touch bilaterally.  ?Coordination: FTN intact bilaterally, HKS: no ataxia in BLE.No drift.  ?Gait- deferred ? ? ?ASSESSMENT/PLAN ?Ms.  NORRIE LEVANDOWSKI is a 56 y.o. female with history of HTN, HLD, PAF (no anticoagulation), morbid obesity, DM, anxiety, depression and migraines presenting after she underwent cardiac cath yesterday and afterwards noted multiple floaters in her vision.  She had a headache all day yesterday.  Her symptoms resolved spontaneously, and she has been admitted for TIA workup. ? ?TIA vs. Complex migraine:   ?Code Stroke CT head No acute abnormality. Age intdeterminta infarct at junction of right caudate nucleus and right corona radiata, lacunar infarct in left basal ganglia, chronic in appearance. Atrophy.  ?CTA neck 70% stenosis of left ICA and severe stenosis of right ICA ?MRI  pending ?MRA  advanced intracranial atherosclerosis with stenosis at bilateral M2, left A1 and right PCA ?2D Echo EF 60-65%, dilated left atrium, normal atrial septum ?LDL 227 ?HgbA1c 6.0 ?VTE prophylaxis - lovenox ?   ?Diet  ? Diet heart healthy/carb modified Room service appropriate? Yes; Fluid consistency: Thin  ? ?No antithrombotic prior to admission, now on aspirin 81 mg daily and clopidogrel 75 mg daily.  ?Therapy recommendations:  no PT follow up, OT pending ?Disposition:  pending ? ?Hypertension ?Home meds:  lisinopril 10 mg daily ?Stable ?Keep SBP <180 ?Long-term BP goal normotensive ? ?Hyperlipidemia ?Home meds:  none ?LDL 227, goal < 70 ?High intensity statin not indicated due to previous adverse reaction ?Follow up with lipid clinic for PCSK9 inhibitor ? ?Diabetes type II Controlled ?Home meds:  Jardiance 10 mg  daily, metformin 500 mg BID ?HgbA1c 6.0, goal < 7.0 ?CBGs ?Recent Labs  ?  12/05/21 ?1619 12/05/21 ?2143 12/06/21 ?0854  ?GLUCAP 122* 136* 124*  ?  ?SSI ? ?Other Stroke Risk Factors ?Former Cigarette smoker ?Obesity, Body mass index is 51.65 kg/m?., BMI >/= 30 associated with increased stroke risk, recommend weight loss, diet and exercise as appropriate  ?Migraines ? ?Other Active Problems ?Familial hypercholesterolemia ?Cannot  tolerate statins ?Follow up with lipid clinic ? ?Hospital day # 0 ? ?[place] , MSN, AGACNP-BC ?[facility] ?See Amion for schedule and pager information ?12/06/2021 11:04 AM ? ?ATTENDI

## 2021-12-06 NOTE — Plan of Care (Signed)
?  Problem: Education: ?Goal: Knowledge of General Education information will improve ?Description: Including pain rating scale, medication(s)/side effects and non-pharmacologic comfort measures ?12/06/2021 0149 by Cottie Banda, Mardelle Matte, RN ?Outcome: Not Progressing ?12/06/2021 0006 by Cottie Banda, Mardelle Matte, RN ?Outcome: Not Progressing ?  ?Problem: Health Behavior/Discharge Planning: ?Goal: Ability to manage health-related needs will improve ?12/06/2021 0149 by Cottie Banda, Mardelle Matte, RN ?Outcome: Not Progressing ?12/06/2021 0006 by Cottie Banda, Mardelle Matte, RN ?Outcome: Not Progressing ?  ?Problem: Clinical Measurements: ?Goal: Ability to maintain clinical measurements within normal limits will improve ?12/06/2021 0149 by Cottie Banda, Mardelle Matte, RN ?Outcome: Not Progressing ?12/06/2021 0006 by Cottie Banda, Mardelle Matte, RN ?Outcome: Not Progressing ?Goal: Will remain free from infection ?12/06/2021 0149 by Cottie Banda, Mardelle Matte, RN ?Outcome: Not Progressing ?12/06/2021 0006 by Cottie Banda, Mardelle Matte, RN ?Outcome: Not Progressing ?Goal: Diagnostic test results will improve ?12/06/2021 0149 by Cottie Banda, Mardelle Matte, RN ?Outcome: Not Progressing ?12/06/2021 0006 by Cottie Banda, Mardelle Matte, RN ?Outcome: Not Progressing ?Goal: Respiratory complications will improve ?12/06/2021 0149 by Cottie Banda, Mardelle Matte, RN ?Outcome: Not Progressing ?12/06/2021 0006 by Cottie Banda, Mardelle Matte, RN ?Outcome: Not Progressing ?Goal: Cardiovascular complication will be avoided ?12/06/2021 0149 by Cottie Banda, Mardelle Matte, RN ?Outcome: Not Progressing ?12/06/2021 0006 by Cottie Banda, Mardelle Matte, RN ?Outcome: Not Progressing ?  ?Problem: Activity: ?Goal: Risk for activity intolerance will decrease ?12/06/2021 0149 by Cottie Banda, Mardelle Matte, RN ?Outcome: Not Progressing ?12/06/2021 0006 by Cottie Banda, Mardelle Matte, RN ?Outcome: Not Progressing ?  ?Problem: Nutrition: ?Goal: Adequate nutrition will be maintained ?12/06/2021 0149 by Cottie Banda, Mardelle Matte, RN ?Outcome: Not Progressing ?12/06/2021 0006 by Cottie Banda, Mardelle Matte, RN ?Outcome: Not Progressing ?  ?Problem: Coping: ?Goal: Level of anxiety will decrease ?12/06/2021 0149 by Cottie Banda, Mardelle Matte, RN ?Outcome: Not Progressing ?12/06/2021 0006 by Cottie Banda, Mardelle Matte, RN ?Outcome: Not Progressing ?  ?Problem: Elimination: ?Goal: Will not experience complications related to bowel motility ?12/06/2021 0149 by Cottie Banda, Mardelle Matte, RN ?Outcome: Not Progressing ?12/06/2021 0006 by Cottie Banda, Mardelle Matte, RN ?Outcome: Not Progressing ?Goal: Will not experience complications related to urinary retention ?12/06/2021 0149 by Cottie Banda, Mardelle Matte, RN ?Outcome: Not Progressing ?12/06/2021 0006 by Cottie Banda, Mardelle Matte, RN ?Outcome: Not Progressing ?  ?Problem: Pain Managment: ?Goal: General experience of comfort will improve ?12/06/2021 0149 by Cottie Banda, Mardelle Matte, RN ?Outcome: Not Progressing ?12/06/2021 0006 by Cottie Banda, Mardelle Matte, RN ?Outcome: Not Progressing ?  ?Problem: Safety: ?Goal: Ability to remain free from injury will improve ?12/06/2021 0149 by Cottie Banda, Mardelle Matte, RN ?Outcome: Not Progressing ?12/06/2021 0006 by Cottie Banda, Mardelle Matte, RN ?Outcome: Not Progressing ?  ?Problem: Skin Integrity: ?Goal: Risk for impaired skin integrity will decrease ?12/06/2021 0149 by Cottie Banda, Mardelle Matte, RN ?Outcome: Not Progressing ?12/06/2021 0006 by Cottie Banda, Mardelle Matte, RN ?Outcome: Not Progressing ?  ?Problem: Self-Care: ?Goal: Verbalization of feelings and concerns over difficulty with self-care will improve ?Outcome: Not Progressing ?Goal: Ability to communicate needs accurately will improve ?Outcome: Not Progressing ?  ?

## 2021-12-06 NOTE — Discharge Summary (Addendum)
Physician Discharge Summary  ?Elizza Cutcher Gass [id number] DOB: April 02, 1966 DOA: 12/05/2021 ? ?PCP: Katherina Mires, MD ? ?Admit date: 12/05/2021 ?Discharge date: 12/06/2021 ? ?Time spent: 40 minutes ? ?Recommendations for Outpatient Follow-up:  ?Patient left against medical advice ?Follow outpatient CBC/CMP  ?Follow with cardiology outpatient to discuss whether PCI an option ?Follow with cardiology for lipid management ?Follow with vascular outpatient regarding carotid stenosis ?Follow with neurology outpatient for TIA  ? ?Discharge Diagnoses:  ?Principal Problem: ?  TIA (transient ischemic attack) ?Active Problems: ?  Coronary artery disease involving native coronary artery of native heart with angina pectoris ([place]) ?  Carotid stenosis, bilateral ?  Hyperlipidemia ?  Diabetes mellitus ([place]) ?  Hypertension ?  Morbid obesity with BMI of 50.0-59.9, adult ([place]) ?  Stroke-like symptoms ? ? ?Discharge Condition: stable ? ?Diet recommendation: heart healthy, diabetic ? ?Filed Weights  ? 12/05/21 0626  ?Weight: (!) 145.2 kg  ? ? ?History of present illness:  ?Penny Huerta is Penny Huerta 56 y.o. female with medical history significant of afib not on AC; DM; morbid obesity; HTN; and HLD who presented today for cath due to exertional angina and +coronary CTA.   Post-cath she noticed floaters in both eyes.  The floaters resolved and then recurred "pretty strongly" and then resolved again.  Code stroke was called.  Now being evaluated by neurology.   ? ?She didn't tolerate MRI x2.  Requested to discharge home 3/25 PM.  I had an extended discussion with her regarding need for completion of stroke workup, planning regarding her multivessel coronary artery disease, and carotid artery stenosis.  She was insistent that she was not going to stay and understood risks including death/disability.  ? ?Left AMA 3/25 PM. ? ?See below for additional details ? ?Hospital Course:  ?Assessment and Plan: ?* TIA (transient ischemic attack) ?TIA vs complex  migraine ?Head CT 3/24 with age indeterminate 14 mm infarct at junction of R cuadate nucleus and R corona radiata/internal capsule, lacunar infarct within L basal ganglia/internal capsule - no acute intracranial hemorrhage or acute demarcated cortical infarction ?MRA head with advanced intracranial atherosclerosis with widespread stenoses  ?MRI brain deferred yesterday due to anxiety during procedure ?CTA with 70% stenosis on L and severe/non measurable on R - not clear whether these symptomatic per my discussion with neuro -> will refer to vascular outpatient ?Planning for repeat MRI brain today with MRA neck as well - didn't tolerate repeat MRI, referring to neurology and vascular outpatient ?Echo 10/14/2021 with Ef 60-65% (see report) ?LDL 227, a1c 6 ?Holding lisinopril, metop for permissive htn ?PT/OT ?SLP ?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 Debara Pickett, would [facility] from [facility].  Starting zetia.   ?Currently on aspirin/plavix per neurology -> DAPTx3 months, then plavix alone ? ?Carotid stenosis, bilateral ?CTA with 70% stenosis on L and severe non measurable on R ?Per my discussion with neurology, unclear whether symptomatic at this point ?Vascular referral outpatient  ? ?Coronary artery disease involving native coronary artery of native heart with angina pectoris ([place]) ?Cath performed 3/24 with multivessel CAD ?CT surgery consulted high perioperative risk -> recommending considering stenting, though not idea ?Per Dr. Debara Pickett, will need discussion with interventional cardiology Monday to determine if PCI is option ?She may consider second opinion regarding surgery ?She was insistent on leaving, recommended staying for further discussion with cardiology regarding next steps, but she wanted to leave.  Recommended outpatient follow up with cards,  ASAP. ? 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 ?Hypertension ?Resume BP meds ? ?Diabetes mellitus ([place]) ?Recent A1c was 6, indicating good control ?Resume Glucophage, Jardiance ? ?Morbid obesity with BMI of 50.0-59.9, adult ([place]) ?-Body mass index is 51.65 kg/m?..  ?Weight loss should be encouraged ?Outpatient PCP/bariatric medicine/bariatric surgery f/u encouraged ? ? ?Procedures: ?LHC ?  Ost RCA lesion is 70% stenosed. ?  Mid RCA lesion is 60% stenosed. ?  RPDA lesion is 95% stenosed. ?  2nd RPL lesion is 99% stenosed. ?  Prox LAD lesion is 80% stenosed. ?  LV end diastolic pressure is moderately elevated. ?  The left ventricular ejection fraction is 55-65% by visual estimate. ?  ?1.  Multivessel coronary artery disease consisting of ostial right coronary artery, mid right coronary artery, right PDA, R PLV, and mid LAD disease.  The patient will be referred to cardiothoracic surgery for further recommendations. ?2.  LVEDP of 21 mmHg with preserved LV function.  ? ?Consultations: ?Neurology ?cardiology ? ?Discharge Exam: ?Vitals:  ? 12/06/21 1141 12/06/21 1512  ?BP: 137/86 133/84  ?Pulse: 94 (!) 101  ?Resp: 18 18  ?Temp: 97.6 ?F (36.4 ?C) 98 ?F (36.7 ?C)  ?SpO2: 98% 97%  ? ?I had extended discussion regarding importance of staying for completion of workup and making follow up plan.  Ultimately, [name] said there wasn't much we could do that would change her mind.  She wasn't afraid of dying.  She was tired of the medicines and wanted to sleep in her own bed.  Discussed importance of the ongoing workup and follow up planning, discussed risk of death/disability with discharge AMA.  She was insistent.  Meds prescribed, referrals placed as above.  ? ?See progress note ? ?Discharge Instructions ? ? ?Discharge Instructions   ? ? Ambulatory referral to Neurology   Complete by: As directed ?  ? An appointment is requested in approximately: 2 weeks  ? Ambulatory referral to Vascular Surgery    Complete by: As directed ?  ? Call MD for:  difficulty breathing, headache or visual disturbances   Complete by: As directed ?  ? Call MD for:  extreme fatigue   Complete by: As directed ?  ? Call MD for:  hives   Complete by: As directed ?  ? Call MD for:  persistant dizziness or light-headedness   Complete by: As directed ?  ? Call MD for:  persistant nausea and vomiting   Complete by: As directed ?  ? Call MD for:  redness, tenderness, or signs of infection (pain, swelling, redness, odor or green/yellow discharge around incision site)   Complete by: As directed ?  ? Call MD for:  severe uncontrolled pain   Complete by: As directed ?  ? Call MD for:  temperature >100.4   Complete by: As directed ?  ? Diet - low sodium heart healthy   Complete by: As directed ?  ? Discharge instructions   Complete by: As directed ?  ? You were admitted for Vin Yonke catheterization, this was abnormal, but you had vision abnormalities afterwards concerning for Antha Niday TIA (mini stroke).   ? ?You're leaving against medical advice, there's still additional workup and planning needed to ensure Dimitri Shakespeare safe discharge. ? ?I'll prescribe your aspirin and plavix, you should take these together for 90 days, then you'll transition to plavix alone.  I'll also prescribe the zetia  for your cholesterol, it's important that you follow up with Dr. Debara Pickett outpatient for the PCSK9 inhibitor to further lower your cholesterol. ? ?Your head CT showed evidence of old strokes.  ? ?Your MRI brain was unable to be done, you should follow with neurology or your outpatient to discuss this further and pursue additional imaging. ? ?Your MRA brain showed advanced intracranial atherosclerosis. ? ?Your vascular imaging of your neck showed 70% stenosis on the left and was severe/non measurable on the right.   ? ?I'll place Myra Weng referral to vascular for your carotid disease.  I'll place Vishnu Moeller referral to neurology for your TIA.   ? ?Please call your cardiologist first thing Monday, your cath  showed multivessel disease.  CT surgery thought you were high risk and recommended stenting.  You'll need to continue to discuss options with cardiology. ? ?We recommend you stay for additional workup an

## 2021-12-06 NOTE — Assessment & Plan Note (Signed)
CTA with 70% stenosis on L and severe non measurable on R ?Per my discussion with neurology, unclear whether symptomatic at this point ?Vascular referral outpatient  ?

## 2021-12-06 NOTE — Hospital Course (Addendum)
[name] is Penny Huerta 56 y.o. female with medical history significant of afib not on AC; DM; morbid obesity; HTN; and HLD who presented today for cath due to exertional angina and +coronary CTA.   Post-cath she noticed floaters in both eyes.  The floaters resolved and then recurred "pretty strongly" and then resolved again.  Code stroke was called.  Now being evaluated by neurology.   ? ?She didn't tolerate MRI x2.  Requested to discharge home 3/25 PM.  I had an extended discussion with her regarding need for completion of stroke workup, planning regarding her multivessel coronary artery disease, and carotid artery stenosis.  She was insistent that she was not going to stay and understood risks including death/disability.  ? ?Left AMA 3/25 PM. ? ?See below for additional details ?

## 2021-12-06 NOTE — Plan of Care (Signed)
?  Problem: Safety: Goal: Ability to remain free from injury will improve Outcome: Not Progressing   Problem: Pain Managment: Goal: General experience of comfort will improve Outcome: Not Progressing   Problem: Clinical Measurements: Goal: Diagnostic test results will improve Outcome: Not Progressing   

## 2021-12-06 NOTE — Assessment & Plan Note (Addendum)
Resume BP meds ?

## 2021-12-06 NOTE — Progress Notes (Signed)
SLP Cancellation Note ? ?Patient Details ?Name: Penny Huerta ?MRN: [id number] ?DOB: [birth year] ? ? ?Cancelled treatment:       Reason Eval/Treat Not Completed: SLP screened, no needs identified, will sign off; pt back to baseline functioning; MRI pending, but no needs identified for ST.  ST will s/o in acute setting. ? ? ?Elvina Sidle, M.S., CCC-SLP ?12/06/2021, 1:10 PM ?

## 2021-12-06 NOTE — Progress Notes (Signed)
Patient discharged per md order.  IV removed. Site unremarkable. All belongings left with patient. Patient transported to discharge via wheelchair with nurse. ?

## 2021-12-06 NOTE — Progress Notes (Signed)
Patient alert and oriented. She is refusing heart monitor at this time. She says shes ready for discharge. Primary RN sonya made aware. ?

## 2021-12-06 NOTE — Progress Notes (Signed)
?PROGRESS NOTE ? ? ? ?Penny Huerta  [id number] DOB: [birth year] DOA: 12/05/2021 ?PCP: Katherina Mires, MD  ?No chief complaint on file. ? ? ?Brief Narrative:  ?Penny Huerta is Hurley Sobel 56 y.o. female with medical history significant of afib not on AC; DM; morbid obesity; HTN; and HLD who presented today for cath due to exertional angina and +coronary CTA.   Post-cath she noticed floaters in both eyes.  The floaters resolved and then recurred "pretty strongly" and then resolved again.  Code stroke was called.  Now being evaluated by neurology.   ? ?See below for additional details  ? ? ?Assessment & Plan: ?  ?Principal Problem: ?  TIA (transient ischemic attack) ?Active Problems: ?  Coronary artery disease involving native coronary artery of native heart with angina pectoris ([place]) ?  Hyperlipidemia ?  Diabetes mellitus ([place]) ?  Hypertension ?  Morbid obesity with BMI of 50.0-59.9, adult ([place]) ? ? ?Assessment and Plan: ?* TIA (transient ischemic attack) ?TIA vs complex migraine ?Head CT 3/24 with age indeterminate 14 mm infarct at junction of R cuadate nucleus and R corona radiata/internal capsule, lacunar infarct within L basal ganglia/internal capsule - no acute intracranial hemorrhage or acute demarcated cortical infarction ?MRA head with advanced intracranial atherosclerosis with widespread stenoses  ?MRI brain deferred yesterday due to anxiety during procedure ?CTA with 70% stenosis on L and severe/non measurable on R ?Planning for repeat MRI brain today with MRA neck as well ?Echo 10/14/2021 with Ef 60-65% (see report) ?LDL 227, a1c 6 ?Holding lisinopril, metop for permissive htn ?PT/OT ?SLP ?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 Debara Pickett, would benefit from [facility].  Starting zetia.   ?Currently on aspirin/plavix per neurology ? ?Coronary artery disease involving native coronary artery of native heart with angina pectoris ([place]) ?Cath  performed 3/24 with multivessel CAD ?CT surgery consulted high perioperative risk -> recommending considering stenting, though not idea ?Per Dr. Debara Pickett, will need discussion with interventional cardiology Monday to determine if PCI is option ?She may consider second opinion regarding surgery ? 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 ?Hypertension ?Imdur, holding lisinopril and metop for now ? ?Diabetes mellitus ([place]) ?Recent A1c was 6, indicating good control ?hold Glucophage, Jardiance ?Cover with moderate-scale SSI ? ?Morbid obesity with BMI of 50.0-59.9, adult ([place]) ?-Body mass index is 51.65 kg/m?..  ?Weight loss should be encouraged ?Outpatient PCP/bariatric medicine/bariatric surgery f/u encouraged ? ? ?DVT prophylaxis: lovenox ?Code Status: full ?Family Communication: husband at bedside ?Disposition:  ? ?Status is: Observation ?The patient will require care spanning > 2 midnights and should be moved to inpatient because: continued need for workup, MRI/MRA, cardiology recommendations ?  ?Consultants:  ?Neurology ?cardiology ? ?Procedures:  ?LHC ?  Ost RCA lesion is 70% stenosed. ?  Mid RCA lesion is 60% stenosed. ?  RPDA lesion is 95% stenosed. ?  2nd RPL lesion is 99% stenosed. ?  Prox LAD lesion is 80% stenosed. ?  LV end diastolic pressure is moderately elevated. ?  The left ventricular ejection fraction is 55-65% by visual estimate. ?  ?1.  Multivessel coronary artery disease consisting of ostial right coronary artery, mid right coronary artery, right PDA, R PLV, and mid LAD disease.  The patient will be referred to cardiothoracic surgery for further recommendations. ?2.  LVEDP of 21 mmHg with preserved LV  function. ? ?Antimicrobials:  ?Anti-infectives (From admission, onward)  ? ? None  ? ?  ? ? ?Subjective: ?Feeling better, symptoms resolved ? ?Objective: ?Vitals:  ? 12/06/21 0340  12/06/21 0731 12/06/21 1141 12/06/21 1512  ?BP: 121/82 134/69 137/86 133/84  ?Pulse: 90 89 94 (!) 101  ?Resp: 15 18 18 18   ?Temp: 98 ?F (36.7 ?C) 98.2 ?F (36.8 ?C) 97.6 ?F (36.4 ?C) 98 ?F (36.7 ?C)  ?TempSrc: Oral Oral Oral Oral  ?SpO2: 94% 96% 98% 97%  ?Weight:      ?Height:      ? ? ?Intake/Output Summary (Last 24 hours) at 12/06/2021 1543 ?Last data filed at 12/06/2021 0401 ?Gross per 24 hour  ?Intake 237 ml  ?Output 1200 ml  ?Net -963 ml  ? ?Filed Weights  ? 12/05/21 0626  ?Weight: (!) 145.2 kg  ? ? ?Examination: ? ?General exam: Appears calm and comfortable  ?Respiratory system: unlabored ?Central nervous system: Alert and oriented. No focal neurological deficits. ?Skin: No rashes, lesions or ulcers ?Psychiatry: Judgement and insight appear normal. Mood & affect appropriate.  ? ? ? ?Data Reviewed: I have personally reviewed following labs and imaging studies ? ?CBC: ?Recent Labs  ?Lab 12/02/21 ?[id number]  ?WBC 10.6  ?HGB 12.8  ?HCT 38.3  ?MCV 85  ?PLT 341  ? ? ?Basic Metabolic Panel: ?Recent Labs  ?Lab 12/02/21 ?[id number]  ?NA 139  ?K 4.3  ?CL 104  ?CO2 23  ?GLUCOSE 113*  ?BUN 20  ?CREATININE 0.74  ?CALCIUM 9.8  ? ? ?GFR: ?Estimated Creatinine Clearance: 118.9 mL/min (by C-G formula based on SCr of 0.74 mg/dL). ? ?Liver Function Tests: ?No results for input(s): AST, ALT, ALKPHOS, BILITOT, PROT, ALBUMIN in the last 168 hours. ? ?CBG: ?Recent Labs  ?Lab 12/05/21 ?1255 12/05/21 ?1619 12/05/21 ?2143 12/06/21 ?[id number] 12/06/21 ?1246  ?GLUCAP 127* 122* 136* 124* 106*  ? ? ? ?No results found for this or any previous visit (from the past 240 hour(s)).  ? ? ? ? ? ?Radiology Studies: ?MR ANGIO HEAD WO CONTRAST ? ?Result Date: 12/06/2021 ?CLINICAL DATA:  TIA. EXAM: MRA HEAD WITHOUT CONTRAST TECHNIQUE: Angiographic images of the Circle of Willis were acquired using MRA technique without intravenous contrast. COMPARISON:  CTA of the neck from yesterday FINDINGS: Anterior circulation: Diffuse atheromatous irregularity. Hypoplastic  appearance of the right A1 segment. High-grade narrowing seen at bilateral M2 branches with mild to moderate left M1 segment stenosis. High-grade left ACA origin narrowing. No detected aneurysm when allowing for atheromatous irregularity of the carotid siphons Posterior circulation: Diffuse atheromatous irregularity of the vertebral and basilar arteries. The right vertebral artery is dominant. High-grade right proximal PCA narrowing. Moderate to advanced left P2 segment stenosis. Anatomic variants: As above IMPRESSION: Advanced intracranial atherosclerosis with widespread stenoses, narrowings most advanced at the bilateral M2, left A1, and right PCA. Electronically Signed   By: Jorje Guild M.D.   On: 12/06/2021 05:30  ? ?CARDIAC CATHETERIZATION ? ?Result Date: 12/05/2021 ?  Ost RCA lesion is 70% stenosed.   Mid RCA lesion is 60% stenosed.   RPDA lesion is 95% stenosed.   2nd RPL lesion is 99% stenosed.   Prox LAD lesion is 80% stenosed.   LV end diastolic pressure is moderately elevated.   The left ventricular ejection fraction is 55-65% by visual estimate. 1.  Multivessel coronary artery disease consisting of ostial right coronary artery, mid right coronary artery, right PDA, R PLV, and mid LAD disease.  The patient will be referred to cardiothoracic surgery  for further recommendations. 2.  LVEDP of 21 mmHg with preserved LV function.  ? ?CT HEAD CODE STROKE WO CONTRAST ? ?Addendum Date: 12/05/2021   ?ADDENDUM REPORT: 12/05/2021 10:31 ADDENDUM: These results were called by telephone at the time of interpretation on 12/05/2021 at 9:12 am to provider Dr. Theda Sers , who verbally acknowledged these results. Electronically Signed   By: Kellie Simmering D.O.   On: 12/05/2021 10:31  ? ?Result Date: 12/05/2021 ?CLINICAL DATA:  Code stroke. Neuro deficit, acute, stroke suspected. EXAM: CT HEAD WITHOUT CONTRAST TECHNIQUE: Contiguous axial images were obtained from the base of the skull through the vertex without intravenous  contrast. RADIATION DOSE REDUCTION: This exam was performed according to the departmental dose-optimization program which includes automated exposure control, adjustment of the mA and/or kV according to patient size and/or [place]

## 2021-12-06 NOTE — Assessment & Plan Note (Signed)
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

## 2021-12-06 NOTE — Progress Notes (Signed)
? ?DAILY PROGRESS NOTE  ? ?Patient Name: Penny Huerta ?Date of Encounter: 12/06/2021 ?Cardiologist: None ? ?Chief Complaint  ? ?No chest pain ? ?Patient Profile  ? ?56 yo female with angina, s/p coronary CT which suggested multivessel disease - cardiac cath showed multivessel CAD - post-cath complicated by suspected TIA/stroke ? ?Subjective  ? ?No further chest pain. Plavix was on hold for CABG (restarted), however, seen by Dr. Roxan Hockey yesterday and targets not felt to be good for CABG. He suggested PCI vs medical therapy. She has a history of statin intolerance and has actually been referred to see me with a virtual appt in May. Also noted during stroke work-up to have at least 70% stenosis of the LEFT ICA.  Lipids today show poor control with TC 314, TG 312, HDL 25 and LDL 227 (previously 266) - findings highly suggestive of familial hyperlipidemia. ? ?Objective  ? ?Vitals:  ? 12/05/21 1941 12/05/21 2323 12/06/21 0340 12/06/21 0731  ?BP: 134/66 128/68 121/82 134/69  ?Pulse: 98  90 89  ?Resp: 17 17 15 18   ?Temp: 98.9 ?F (37.2 ?C) 98.7 ?F (37.1 ?C) 98 ?F (36.7 ?C) 98.2 ?F (36.8 ?C)  ?TempSrc: Oral Oral Oral Oral  ?SpO2: 95%  94% 96%  ?Weight:      ?Height:      ? ? ?Intake/Output Summary (Last 24 hours) at 12/06/2021 1035 ?Last data filed at 12/06/2021 0401 ?Gross per 24 hour  ?Intake 477 ml  ?Output 1450 ml  ?Net -973 ml  ? ?Filed Weights  ? 12/05/21 0626  ?Weight: (!) 145.2 kg  ? ? ?Physical Exam  ? ?General appearance: alert and no distress ?Lungs: clear to auscultation bilaterally ?Heart: regular rate and rhythm ?Extremities: extremities normal, atraumatic, no cyanosis or edema ?Neurologic: Grossly normal ? ?Inpatient Medications  ?  ?Scheduled Meds: ? aspirin EC  81 mg Oral Daily  ? clopidogrel  75 mg Oral Daily  ? diazepam  5 mg Intravenous Once  ? diazepam  5 mg Intravenous Once  ? enoxaparin (LOVENOX) injection  70 mg Subcutaneous Q24H  ? fluticasone  2 spray Each Nare q AM  ? insulin aspart  0-15 Units  Subcutaneous TID WC  ? insulin aspart  0-5 Units Subcutaneous QHS  ? isosorbide mononitrate  60 mg Oral QPM  ? oxyCODONE  5 mg Oral STAT  ? pantoprazole  40 mg Oral QPM  ? sodium chloride flush  3 mL Intravenous Q12H  ? ? ?Continuous Infusions: ? sodium chloride    ? ? ?PRN Meds: ?sodium chloride, acetaminophen **OR** acetaminophen (TYLENOL) oral liquid 160 mg/5 mL **OR** acetaminophen, ondansetron (ZOFRAN) IV, senna-docusate, sodium chloride flush  ? ?Labs  ? ?Results for orders placed or performed during the hospital encounter of 12/05/21 (from the past 48 hour(s))  ?Glucose, capillary     Status: Abnormal  ? Collection Time: 12/05/21  8:28 AM  ?Result Value Ref Range  ? Glucose-Capillary 142 (H) 70 - 99 mg/dL  ?  Comment: Glucose reference range applies only to samples taken after fasting for at least 8 hours.  ? Comment 1 Notify RN   ? Comment 2 Document in Chart   ?Glucose, capillary     Status: Abnormal  ? Collection Time: 12/05/21 12:55 PM  ?Result Value Ref Range  ? Glucose-Capillary 127 (H) 70 - 99 mg/dL  ?  Comment: Glucose reference range applies only to samples taken after fasting for at least 8 hours.  ?Urine rapid drug screen (hosp performed)not at  [facility]     Status: Abnormal  ? Collection Time: 12/05/21  2:35 PM  ?Result Value Ref Range  ? Opiates NONE DETECTED NONE DETECTED  ? Cocaine NONE DETECTED NONE DETECTED  ? Benzodiazepines POSITIVE (A) NONE DETECTED  ? Amphetamines NONE DETECTED NONE DETECTED  ? Tetrahydrocannabinol NONE DETECTED NONE DETECTED  ? Barbiturates NONE DETECTED NONE DETECTED  ?  Comment: (NOTE) ?DRUG SCREEN FOR MEDICAL PURPOSES ?ONLY.  IF CONFIRMATION IS NEEDED ?FOR ANY PURPOSE, NOTIFY LAB ?WITHIN 5 DAYS. ? ?LOWEST DETECTABLE LIMITS ?FOR URINE DRUG SCREEN ?Drug Class                     Cutoff (ng/mL) ?Amphetamine and metabolites    1000 ?Barbiturate and metabolites    200 ?Benzodiazepine                 200 ?Tricyclics and metabolites     300 ?Opiates and metabolites         300 ?Cocaine and metabolites        300 ?THC                            50 ?Performed at [facility], [place] [address], [place], [place] ?[address] ?  ?Glucose, capillary     Status: Abnormal  ? Collection Time: 12/05/21  4:19 PM  ?Result Value Ref Range  ? Glucose-Capillary 122 (H) 70 - 99 mg/dL  ?  Comment: Glucose reference range applies only to samples taken after fasting for at least 8 hours.  ?Glucose, capillary     Status: Abnormal  ? Collection Time: 12/05/21  9:43 PM  ?Result Value Ref Range  ? Glucose-Capillary 136 (H) 70 - 99 mg/dL  ?  Comment: Glucose reference range applies only to samples taken after fasting for at least 8 hours.  ? Comment 1 Notify RN   ?Hemoglobin A1c     Status: Abnormal  ? Collection Time: 12/06/21  7:42 AM  ?Result Value Ref Range  ? Hgb A1c MFr Bld 6.0 (H) 4.8 - 5.6 %  ?  Comment: (NOTE) ?Pre diabetes:          5.7%-6.4% ? ?Diabetes:              >6.4% ? ?Glycemic control for   <7.0% ?adults with diabetes ?  ? Mean Plasma Glucose 125.5 mg/dL  ?  Comment: Performed at [facility], [place] [address] [place], [place] [address]  ?Lipid panel     Status: Abnormal  ? Collection Time: 12/06/21  7:42 AM  ?Result Value Ref Range  ? Cholesterol 314 (H) 0 - 200 mg/dL  ? Triglycerides 312 (H) <150 mg/dL  ? HDL 25 (L) >40 mg/dL  ? Total CHOL/HDL Ratio 12.6 RATIO  ? VLDL 62 (H) 0 - 40 mg/dL  ? LDL Cholesterol 227 (H) 0 - 99 mg/dL  ?  Comment:        ?Total Cholesterol/HDL:CHD Risk ?Coronary Heart Disease Risk Table ?                    Men   Women ? 1/2 Average Risk   3.4   3.3 ? Average Risk       5.0   4.4 ? 2 X Average Risk   9.6   7.1 ? 3 X Average Risk  23.4   11.0 ?       ?Use the  calculated Patient Ratio ?above and the CHD Risk Table ?to determine the patient's CHD Risk. ?       ?ATP III CLASSIFICATION (LDL): ? <100     mg/dL   Optimal ? 100-129  mg/dL   Near or Above ?                   Optimal ? 130-159  mg/dL   Borderline ? 160-189  mg/dL   High ? >190     mg/dL   Very  High ?Performed at [facility], [place] [address] [place], [place] [address] ?  ?Glucose, capillary     Status: Abnormal  ? Collection Time: 12/06/21  8:54 AM  ?Result Value Ref Range  ? Glucose-Capillary 124 (H) 70 - 99 mg/dL  ?  Comment: Glucose reference range applies only to samples taken after fasting for at least 8 hours.  ? Comment 1 Notify RN   ? Comment 2 Document in Chart   ? ? ?ECG  ? ?N/a ? ?Telemetry  ? ?Sinus rhythm - Personally Reviewed ? ?Radiology  ?  ?MR ANGIO HEAD WO CONTRAST ? ?Result Date: 12/06/2021 ?CLINICAL DATA:  TIA. EXAM: MRA HEAD WITHOUT CONTRAST TECHNIQUE: Angiographic images of the Circle of Willis were acquired using MRA technique without intravenous contrast. COMPARISON:  CTA of the neck from yesterday FINDINGS: Anterior circulation: Diffuse atheromatous irregularity. Hypoplastic appearance of the right A1 segment. High-grade narrowing seen at bilateral M2 branches with mild to moderate left M1 segment stenosis. High-grade left ACA origin narrowing. No detected aneurysm when allowing for atheromatous irregularity of the carotid siphons Posterior circulation: Diffuse atheromatous irregularity of the vertebral and basilar arteries. The right vertebral artery is dominant. High-grade right proximal PCA narrowing. Moderate to advanced left P2 segment stenosis. Anatomic variants: As above IMPRESSION: Advanced intracranial atherosclerosis with widespread stenoses, narrowings most advanced at the bilateral M2, left A1, and right PCA. Electronically Signed   By: Jorje Guild M.D.   On: 12/06/2021 05:30  ? ?CARDIAC CATHETERIZATION ? ?Result Date: 12/05/2021 ?  Ost RCA lesion is 70% stenosed.   Mid RCA lesion is 60% stenosed.   RPDA lesion is 95% stenosed.   2nd RPL lesion is 99% stenosed.   Prox LAD lesion is 80% stenosed.   LV end diastolic pressure is moderately elevated.   The left ventricular ejection fraction is 55-65% by visual estimate. 1.  Multivessel coronary artery disease  consisting of ostial right coronary artery, mid right coronary artery, right PDA, R PLV, and mid LAD disease.  The patient will be referred to cardiothoracic surgery for further recommendations. 2.  LVEDP of 21 mmHg w

## 2021-12-06 NOTE — Care Management (Signed)
?  Transition of Care (TOC) Screening Note ? ? ?Patient Details  ?Name: Penny Huerta ?Date of Birth: [birth year] ? ? ?Transition of Care (TOC) CM/SW Contact:    ?Carles Collet, RN ?Phone Number: ?12/06/2021, 9:47 AM ? ? ? ?Transition of Care Department [facility]) has reviewed patient and we will continue to monitor patient advancement through interdisciplinary progression rounds.  ? ?From home with spouse, here with TIA. Utilizes cane for ambulation at home. No current HH or DME needs, had utilized [place] in January of this year, however [place] not needed at this time.   ?

## 2021-12-08 MED FILL — Heparin Sod (Porcine)-NaCl IV Soln 1000 Unit/500ML-0.9%: INTRAVENOUS | Qty: 1000 | Status: AC

## 2021-12-09 NOTE — Progress Notes (Signed)
?Cardiology Office Note:   ? ?Date:  12/09/2021  ? ?ID:  Penny Huerta, DOB 06-28-1966, MRN [id number] ? ?PCP:  Katherina Mires, MD  ? ?[facility] Providers ?Cardiologist:  Lenna Sciara, MD ?Referring MD: Katherina Mires, MD  ? ?Chief Complaint/Reason for Referral:  Chest pain ? ?ASSESSMENT:   ? ?1. Angina pectoris ([place])   ?2. Type 2 diabetes mellitus without complication, without long-term current use of insulin ([place])   ?3. Hypertension associated with diabetes ([place])   ?4. Hyperlipidemia associated with type 2 diabetes mellitus ([place])   ?5. Bilateral carotid artery stenosis   ? ? ?PLAN:   ? ?In order of problems listed above: ?1.  We had a long talk about her potential treatment options.  Given her bilateral carotid disease and high-grade intracranial atherosclerotic disease I do not think she is a great candidate for surgery take into consideration her body habitus as well.  However she is interested in obtaining a second opinion regarding surgical revascularization.  She will be doing this at [facility] and I have encouraged her to follow through.  If she is found not to be a surgical candidate then we did broach the subject of percutaneous coronary intervention to alleviate angina.  She will let me know how how these discussions go I will see her back in 3 months or earlier if needed.  We will increase Imdur to 90 mg up from 60 mg. ?2.  Continue Jardiance, aspirin, lisinopril, and she is to see Dr. Debara Pickett for lipid therapy as she is statin intolerant. ?3.  Blood pressures well controlled. ?4.  To see Dr. Debara Pickett in the next few months. ?5.  This will need to be addressed at some point in time.  I think this will preclude her from surgery and therefore after PCI in the future her carotid disease could be addressed.  I did tell her that she is at relatively high risk for periprocedural stroke around the PCI procedure.  Will refer to neurology for hospital follow up. ? ? ? ?Dispo:  No follow-ups on file.  ? ?   ? ?Medication Adjustments/Labs and Tests Ordered: ?Current medicines are reviewed at length with the patient today.  Concerns regarding medicines are outlined above.  ? ?Tests Ordered: ?No orders of the defined types were placed in this encounter. ? ? ?Medication Changes: ?No orders of the defined types were placed in this encounter. ? ? ?History of Present Illness:   ? ?FOCUSED PROBLEM LIST: ?1.  Multivessel coronary artery disease on cardiac catheterization March 2023 ?2.  Type 2 diabetes ?3.  Hypertension ?4.  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  BMI 51 ?6.  Peripheral vascular disease with advanced intracranial atherosclerosis and 70% stenosis of left ICA and severe stenosis of right ICA ? ?The patient returns for follow-up.  She underwent cardiac catheterization recently which demonstrated multivessel disease.  She was seen by cardiac surgery not thought to be an optimal candidate for bypass grafting.  After the catheterization she had visual field findings and so she was evaluated by neurology which revealed advanced intracranial atherosclerosis and high-grade carotid disease.  She was supposed to get an MRI to evaluate further but she left AMA. ? ?She continues to have chest pain.  Her Imdur is up to 60 mg.  She takes nitroglycerin may be 2-3 times a week at times.  She tells me when the Imdur wears off she will develop chest pain.  This  only happens when she exerts exerting herself.  She does get short of breath at times as well.  She has had no severe bleeding or bruising while on Plavix.  She has required no emergency room visits or hospitalizations.  She has had no recurrent signs or symptoms of stroke. ? ?  ?Previous Medical History: ?Past Medical History:  ?Diagnosis Date  ? Allergy   ? Anxiety   ? Atrial fibrillation ([place])   ? Diabetes mellitus without complication ([place])   ? Heart murmur   ? ARRHYTHMIA  ? Hyperlipidemia   ? Hypertension   ? ? ? ?Current  Medications: ?No outpatient medications have been marked as taking for the 12/10/21 encounter (Appointment) with Early Osmond, MD.  ?  ? ?Allergies:    ?Alprazolam, Citalopram hydrobromide, Nitrofurantoin monohyd macro, Statins, and Sulfa drugs cross reactors  ? ?Social History:   ?Social History  ? ?Tobacco Use  ? Smoking status: Former  ?  Years: 20.00  ?  Types: Cigarettes  ?  Quit date: 09/14/1997  ?  Years since quitting: 24.2  ? Smokeless tobacco: Never  ?Substance Use Topics  ? Alcohol use: Not Currently  ?  Comment: h/o heavy use in 34s  ? Drug use: Not Currently  ?  Comment: "most of them" - quit in 1994  ?  ? ?Family Hx: ?Family History  ?Problem Relation Age of Onset  ? Hyperlipidemia Mother   ? Transient ischemic attack Mother   ? Hypertension Mother   ? Diabetes Mother   ? Transient ischemic attack Father   ? Heart disease Maternal Grandmother 26  ? Hyperlipidemia Maternal Grandmother   ? Hyperlipidemia Maternal Grandfather   ? Heart disease Maternal Grandfather   ? Ovarian cancer Paternal Grandmother   ? Stroke Paternal Grandfather   ? Hypertension Paternal Grandfather   ? Diabetes Paternal Grandfather   ? Sudden death Maternal Uncle   ?  ? ?Review of Systems:   ?Please see the history of present illness.    ?All other systems reviewed and are negative. ?  ? ? ?EKGs/Labs/Other Test Reviewed:   ? ?EKG:  None today ? ?Prior CV studies: ? ?Cath 2023 ?  Ost RCA lesion is 70% stenosed. ?  Mid RCA lesion is 60% stenosed. ?  RPDA lesion is 95% stenosed. ?  2nd RPL lesion is 99% stenosed. ?  Prox LAD lesion is 80% stenosed. ?  LV end diastolic pressure is moderately elevated. ?  The left ventricular ejection fraction is 55-65% by visual estimate. ?  ?1.  Multivessel coronary artery disease consisting of ostial right coronary artery, mid right coronary artery, right PDA, R PLV, and mid LAD disease.  The patient will be referred to cardiothoracic surgery for further recommendations. ?2.  LVEDP of 21 mmHg with  preserved LV function. ? ?TTE 2021 ?1. Left ventricular ejection fraction, by estimation, is 55 to 60%. The  ?left ventricle has normal function. The left ventricle has no regional  ?wall motion abnormalities. Left ventricular diastolic parameters are  ?consistent with Grade I diastolic  ?dysfunction (impaired relaxation).  ? 2. Right ventricular systolic function is normal. The right ventricular  ?size is normal.  ? 3. The mitral valve is normal in structure. No evidence of mitral valve  ?regurgitation. No evidence of mitral stenosis.  ? 4. The aortic valve has an indeterminant number of cusps. Aortic valve  ?regurgitation is not visualized. No aortic stenosis is present.  ? 5. The inferior vena cava is  dilated in size with >50% respiratory  ?variability, suggesting right atrial pressure of 8 mmHg.  ? ?Imaging studies that I have independently reviewed today:  ? ?CTA neck 2023 ?1. Suboptimal study but still positive for flow limiting stenosis at ?the bilateral proximal ICA due to atheromatous plaque, at least 70% ?on the left and severe/non-measurable on the right. ?2. Continuation through the circle-of-Willis shows advanced ?intracranial stenosis with advanced bilateral PCA and M2 narrowings. ? ?CT Head 2023 ?14 mm infarct at the junction of the right caudate nucleus and right ?corona radiata/internal capsule, new from the prior head CT of ?04/05/2006 but otherwise age-indeterminate. A brain MRI may be ?obtained for further evaluation, as clinically warranted. ?  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 ?  ?No acute intracranial hemorrhage or acute demarcated cortical ?infarction. ?  ?Mild generalized cerebral atrophy. ? ?Recent Labs: ?12/02/2021: BUN 20; Creatinine, Ser 0.74; Hemoglobin 12.8; Platelets 341; Potassium 4.3; Sodium 139  ? ?Recent Lipid Panel ?Lab Results  ?Component Value Date/Time  ? CHOL 314 (H) 12/06/2021 07:42 AM   ? CHOL 340 (H) 03/05/2021 10:33 AM  ? TRIG 312 (H) 12/06/2021 07:42 AM  ? HDL 25 (L) 12/06/2021 07:42 AM  ? HDL 26 (L) 03/05/2021 10:33 AM  ? LDLCALC 227 (H) 12/06/2021 07:42 AM  ? LDLCALC 249 (H) 03/05/2021 10:33 A

## 2021-12-10 ENCOUNTER — Other Ambulatory Visit: Payer: Self-pay

## 2021-12-10 ENCOUNTER — Ambulatory Visit: Payer: BC Managed Care – PPO | Admitting: Internal Medicine

## 2021-12-10 ENCOUNTER — Encounter: Payer: Self-pay | Admitting: Internal Medicine

## 2021-12-10 VITALS — BP 116/74 | HR 73 | Ht 66.0 in | Wt 324.6 lb

## 2021-12-10 DIAGNOSIS — I209 Angina pectoris, unspecified: Secondary | ICD-10-CM | POA: Diagnosis not present

## 2021-12-10 DIAGNOSIS — I6523 Occlusion and stenosis of bilateral carotid arteries: Secondary | ICD-10-CM

## 2021-12-10 DIAGNOSIS — E785 Hyperlipidemia, unspecified: Secondary | ICD-10-CM

## 2021-12-10 DIAGNOSIS — E119 Type 2 diabetes mellitus without complications: Secondary | ICD-10-CM | POA: Diagnosis not present

## 2021-12-10 DIAGNOSIS — E1169 Type 2 diabetes mellitus with other specified complication: Secondary | ICD-10-CM | POA: Diagnosis not present

## 2021-12-10 DIAGNOSIS — I152 Hypertension secondary to endocrine disorders: Secondary | ICD-10-CM

## 2021-12-10 DIAGNOSIS — E1159 Type 2 diabetes mellitus with other circulatory complications: Secondary | ICD-10-CM | POA: Diagnosis not present

## 2021-12-10 MED ORDER — ISOSORBIDE MONONITRATE ER 30 MG PO TB24: 90.0000 mg | ORAL_TABLET | Freq: Every day | ORAL | 3 refills | Status: DC

## 2021-12-10 NOTE — Patient Instructions (Signed)
Medication Instructions:  ?Your physician has recommended you make the following change in your medication:  ?1.) increase Imdur to 90 mg daily ? ?*If you need a refill on your cardiac medications before your next appointment, please call your pharmacy* ? ? ?Lab Work: ?none ?If you have labs (blood work) drawn today and your tests are completely normal, you will receive your results only by: ?MyChart Message (if you have MyChart) OR ?A paper copy in the mail ?If you have any lab test that is abnormal or we need to change your treatment, we will call you to review the results. ? ? ?Testing/Procedures: ?none ? ? ?Follow-Up: ?At [facility], you and your health needs are our priority.  As part of our continuing mission to provide you with exceptional heart care, we have created designated Provider Care Teams.  These Care Teams include your primary Cardiologist (physician) and Advanced Practice Providers (APPs -  Physician Assistants and Nurse Practitioners) who all work together to provide you with the care you need, when you need it. ? ? ? ?Your next appointment:   ?3 month(s) ? ?The format for your next appointment:   ?In Person ? ?Provider:   ?None   ? ? ?Other Instructions ?  ?

## 2021-12-16 ENCOUNTER — Telehealth: Payer: Self-pay | Admitting: Internal Medicine

## 2021-12-16 ENCOUNTER — Encounter: Payer: Self-pay | Admitting: Neurology

## 2021-12-16 NOTE — Telephone Encounter (Signed)
Patient is reqesting we send her records to Dr.  Duanne Limerick for her upcoming consultation. Please fax records to [phone] ?

## 2021-12-18 ENCOUNTER — Encounter: Payer: Self-pay | Admitting: Neurology

## 2021-12-18 ENCOUNTER — Ambulatory Visit: Payer: BC Managed Care – PPO | Admitting: Neurology

## 2021-12-18 VITALS — BP 101/75 | HR 98 | Ht 66.0 in | Wt 324.8 lb

## 2021-12-18 DIAGNOSIS — I679 Cerebrovascular disease, unspecified: Secondary | ICD-10-CM | POA: Diagnosis not present

## 2021-12-18 DIAGNOSIS — G459 Transient cerebral ischemic attack, unspecified: Secondary | ICD-10-CM | POA: Diagnosis not present

## 2021-12-18 DIAGNOSIS — Z6841 Body Mass Index (BMI) 40.0 and over, adult: Secondary | ICD-10-CM

## 2021-12-18 DIAGNOSIS — G43109 Migraine with aura, not intractable, without status migrainosus: Secondary | ICD-10-CM

## 2021-12-18 DIAGNOSIS — I6523 Occlusion and stenosis of bilateral carotid arteries: Secondary | ICD-10-CM

## 2021-12-18 DIAGNOSIS — I10 Essential (primary) hypertension: Secondary | ICD-10-CM

## 2021-12-18 DIAGNOSIS — E782 Mixed hyperlipidemia: Secondary | ICD-10-CM

## 2021-12-18 DIAGNOSIS — E119 Type 2 diabetes mellitus without complications: Secondary | ICD-10-CM

## 2021-12-18 DIAGNOSIS — I25118 Atherosclerotic heart disease of native coronary artery with other forms of angina pectoris: Secondary | ICD-10-CM

## 2021-12-18 NOTE — Patient Instructions (Addendum)
Continue aspirin 81mg  daily and Plavix 75mg  daily.  Once you have finished 90 days of Plavix, continue aspirin 81mg  daily alone ?Continue Zetia.  Follow up at the lipid clinic ?Follow up with vascular surgery ?Continue treatment for diabetes ?Mediterranean diet ?Follow up 6 months ? ? ?Mediterranean Diet ?A Mediterranean diet refers to food and lifestyle choices that are based on the traditions of countries located on the . It focuses on eating more fruits, vegetables, whole grains, beans, nuts, seeds, and heart-healthy fats, and eating less dairy, meat, eggs, and processed foods with added sugar, salt, and fat. This way of eating has been shown to help prevent certain conditions and improve outcomes for people who have chronic diseases, like kidney disease and heart disease. ?What are tips for following this plan? ?Reading food labels ?Check the serving size of packaged foods. For foods such as rice and pasta, the serving size refers to the amount of cooked product, not dry. ?Check the total fat in packaged foods. Avoid foods that have saturated fat or trans fats. ?Check the ingredient list for added sugars, such as corn syrup. ?Shopping ? ?Buy a variety of foods that offer a balanced diet, including: ?Fresh fruits and vegetables (produce). ?Grains, beans, nuts, and seeds. Some of these may be available in unpackaged forms or large amounts (in bulk). ?Fresh seafood. ?Poultry and eggs. ?Low-fat dairy products. ?Buy whole ingredients instead of prepackaged foods. ?Buy fresh fruits and vegetables in-season from local farmers markets. ?Buy plain frozen fruits and vegetables. ?If you do not have access to quality fresh seafood, buy precooked frozen shrimp or canned fish, such as tuna, salmon, or sardines. ?Stock your pantry so you always have certain foods on hand, such as olive oil, canned tuna, canned tomatoes, rice, pasta, and beans. ?Cooking ?Cook foods with extra-virgin olive oil instead of using  butter or other vegetable oils. ?Have meat as a side dish, and have vegetables or grains as your main dish. This means having meat in small portions or adding small amounts of meat to foods like pasta or stew. ?Use beans or vegetables instead of meat in common dishes like chili or lasagna. ?Experiment with different cooking methods. Try roasting, broiling, steaming, and saut?ing vegetables. ?Add frozen vegetables to soups, stews, pasta, or rice. ?Add nuts or seeds for added healthy fats and plant protein at each meal. You can add these to yogurt, salads, or vegetable dishes. ?Marinate fish or vegetables using olive oil, lemon juice, garlic, and fresh herbs. ?Meal planning ?Plan to eat one vegetarian meal one day each week. Try to work up to two vegetarian meals, if possible. ?Eat seafood two or more times a week. ?Have healthy snacks readily available, such as: ?Vegetable sticks with hummus. ? yogurt. ?Fruit and nut trail mix. ?Eat balanced meals throughout the week. This includes: ?Fruit: 2-3 servings a day. ?Vegetables: 4-5 servings a day. ?Low-fat dairy: 2 servings a day. ?Fish, poultry, or lean meat: 1 serving a day. ?Beans and legumes: 2 or more servings a week. ?Nuts and seeds: 1-2 servings a day. ?Whole grains: 6-8 servings a day. ?Extra-virgin olive oil: 3-4 servings a day. ?Limit red meat and sweets to only a few servings a month. ?Lifestyle ? ?Cook and eat meals together with your family, when possible. ?Drink enough fluid to keep your urine pale yellow. ?Be physically active every day. This includes: ?Aerobic exercise like running or swimming. ?Leisure activities like gardening, walking, or housework. ?Get 7-8 hours of sleep each night. ?If recommended  by your health care provider, drink red wine in moderation. This means 1 glass a day for nonpregnant women and 2 glasses a day for men. A glass of wine equals 5 oz (150 mL). ?What foods should I eat? ?Fruits ?Apples. Apricots. Avocado. Berries.  Bananas. Cherries. Dates. Figs. Grapes. Lemons. Melon. Oranges. Peaches. Plums. Pomegranate. ?Vegetables ?Artichokes. Beets. Broccoli. Cabbage. Carrots. Eggplant. Green beans. Chard. Kale. Spinach. Onions. Leeks. Peas. Squash. Tomatoes. Peppers. Radishes. ?Grains ?Whole-grain pasta. Brown rice. Bulgur wheat. Polenta. Couscous. Whole-wheat bread. Orpah Cobb. ?Meats and other proteins ?Beans. Almonds. Sunflower seeds. Pine nuts. Peanuts. Cod. Salmon. Scallops. Shrimp. Tuna. Tilapia. Clams. Oysters. Eggs. Poultry without skin. ?Dairy ?Low-fat milk. Cheese. Greek yogurt. ?Fats and oils ?Extra-virgin olive oil. Avocado oil. Grapeseed oil. ?Beverages ?Water. Red wine. Herbal tea. ?Sweets and desserts ?Greek yogurt with honey. Baked apples. Poached pears. Trail mix. ?Seasonings and condiments ?Basil. Cilantro. Coriander. Cumin. Mint. Parsley. Sage. Rosemary. Tarragon. Garlic. Oregano. Thyme. Pepper. Balsamic vinegar. Tahini. Hummus. Tomato sauce. Olives. Mushrooms. ?The items listed above may not be a complete list of foods and beverages you can eat. Contact a dietitian for more information. ?What foods should I limit? ?This is a list of foods that should be eaten rarely or only on special occasions. ?Fruits ?Fruit canned in syrup. ?Vegetables ?Deep-fried potatoes (french fries). ?Grains ?Prepackaged pasta or rice dishes. Prepackaged cereal with added sugar. Prepackaged snacks with added sugar. ?Meats and other proteins ?Beef. Pork. Lamb. Poultry with skin. Hot dogs. Tomasa Blase. ?Dairy ?Ice cream. Sour cream. Whole milk. ?Fats and oils ?Butter. Canola oil. Vegetable oil. Beef fat (tallow). Lard. ?Beverages ?Juice. Sugar-sweetened soft drinks. Beer. Liquor and spirits. ?Sweets and desserts ?Cookies. Cakes. Pies. Candy. ?Seasonings and condiments ?Mayonnaise. Pre-made sauces and marinades. ?The items listed above may not be a complete list of foods and beverages you should limit. Contact a dietitian for more  information. ?Summary ?The Mediterranean diet includes both food and lifestyle choices. ?Eat a variety of fresh fruits and vegetables, beans, nuts, seeds, and whole grains. ?Limit the amount of red meat and sweets that you eat. ?If recommended by your health care provider, drink red wine in moderation. This means 1 glass a day for nonpregnant women and 2 glasses a day for men. A glass of wine equals 5 oz (150 mL). ?This information is not intended to replace advice given to you by your health care provider. Make sure you discuss any questions you have with your health care provider. ?Document Revised: 10/06/2019 Document Reviewed: 08/03/2019 ?Elsevier Patient Education ? 2022 Elsevier Inc. ? ?

## 2021-12-18 NOTE — Progress Notes (Signed)
? ?NEUROLOGY CONSULTATION NOTE ? ?Penny Huerta ?MRN: [id number] ?DOB: 1966-02-26 ? ?Referring provider: Lenna Sciara, MD ?Primary care provider: Suzanna Obey, MD ? ?Reason for consult:  bilateral carotid artery stenosis, stroke-like symptoms ? ?Assessment/Plan:  ? 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  The visual aura is consistent with her typical migraines and semiology not consistent with amaurosis fugax.  She also did not demonstrate any clear lateralizing symptoms to support a cerebrovascular event.  However, she does have cerebrovascular disease and is high-risk for CVA/TIA and should be treated as secondary stroke prevention ?Intracranial atherosclerosis/stenosis ?Bilateral internal carotid artery stenosis ?Hyperlipidemia ?Hypertension ?Type 2 diabetes mellitus ?Multivessel coronary artery disease/Angina pectoris ?Morbid obesity ? ? ?I would remain on ASA 81mg  and Plavix 75mg  daily for 3 months followed by ASA 81mg  daily alone given her intracranial and extracranial stenosis.   ?LDL goal less than 70.  Intolerance to statins.  On Zetia.  Follow up with lipid clinic for PCSK9 inhibitor ?Normotensive blood pressure ?Hgb A1c goal less than 70 ?Follow up with vascular surgery regarding bilateral carotid artery stenosis - may need intervention for the right ICA and perhaps monitor the left ICA.  Ideally, she may tolerate surgery if she first undergoes cardiac surgery.   ?Mediterranean diet ?Weight loss ?Follow up 6 months. ? ? ?Subjective:  ?Penny Huerta is a 56 year old right-handed female with HTN, HLD, DM II, PAF (not on anticoagulation) and migraines who presents for stroke like symptoms and bilateral carotid artery stenosis.  History supplemented by hospital and referring provider's notes. ? ?Patient underwent left cardiac cath and coronary angiography on 12/05/2021 for evaluation of chest pain.  Cardiac cath revealed multivessel coronary artery disease.  After the cardiac cath,  she endorsed seeing black, silver and white floaters in her peripheral vision.  Symptoms lasted definitely less than 2 hours.  No associated facial droop, slurred speech, dizziness or unilateral numbness or weakness.  She had a headache all that day.  Patient has history of ocular migraines described as wavy lines in her peripheral vision.  Stroke code was activated and her NIHSS score was 0 at time of initial evaluation.  CT head showed 14 mm infarct at the function of the right caudate nucleus and right coronal radiata/internal capsule as well as chronic lacunar infarct within the left basal ganglia/internal capsule, both new compared to prior head CT from 04/05/2006.  MRA of head showed advanced intracranial atherosclerosis with stenosis at the bilateral M2, left A1 and right PCA.  CTA of neck showed 70% stenosis of left ICA and severe stenosis of right ICA.  2D echo demonstrated LVEF 60-65% with dilated left atrium but without thrombus, valvulopathy or atrial level shunt.  LDL was 227 and Hgb A1c 6.0.  She was not on any antithrombotic therapy prior to admission and was started on ASA 81mg  and Plavix 75mg  daily.  She has reportedly had adverse reaction to multiple statins, so she was instructed to follow up with the lipid clinic for PCSK9 inhibitor.  In meantime, she was put on Zetia.  She attempted an MRI of the brain twice but was unable to tolerate it due to anxiety and left AMA despite understanding risks of not fully completing the workup.  No recurrent symptoms since hospital discharge.  She followed up with cardiology regarding her angina.  Due to her intracranial atherosclerotic stenosis, high-grade bilateral carotid artery stenosis and body habitus, her cardiologist does not feel she would be an appropriate  candidate for cardiothoracic surgery.  She will be getting a second opinion at [facility].   ? ?  ?Current medications:  ASA 81mg , clopidogrel 75mg , ezetimibe, lisinopril, metoprolol, isosorbide  mononitrate, metformin, empaliflozin ? ?Sitting up in chair and started seeing black spots in peripheral vision - before 2 hours.  severe headache entire day   = had history of ocular migraines wavy lines in peripheral vision without headache - black silver and white  ? ?PAST MEDICAL HISTORY: ?Past Medical History:  ?Diagnosis Date  ? Allergy   ? Anxiety   ? Atrial fibrillation ([place])   ? Diabetes mellitus without complication ([place])   ? Heart murmur   ? ARRHYTHMIA  ? Hyperlipidemia   ? Hypertension   ? ? ?PAST SURGICAL HISTORY: ?Past Surgical History:  ?Procedure Laterality Date  ? HYSTEROSCOPY W/ ENDOMETRIAL ABLATION  2011  ? LEFT HEART CATH AND CORONARY ANGIOGRAPHY N/A 12/05/2021  ? Procedure: LEFT HEART CATH AND CORONARY ANGIOGRAPHY;  Surgeon: Early Osmond, MD;  Location: [place] [facility];  Service: Cardiovascular;  Laterality: N/A;  ? ? ?MEDICATIONS: ?Current Outpatient Medications on File Prior to Visit  ?Medication Sig Dispense Refill  ? aspirin EC 81 MG tablet Take 1 tablet (81 mg total) by mouth in the morning. Swallow whole. 90 tablet 0  ? clopidogrel (PLAVIX) 75 MG tablet Take 1 tablet (75 mg total) by mouth daily. 30 tablet 2  ? Continuous Blood Gluc Receiver (DEXCOM G6 RECEIVER) DEVI 1 Device by Does not apply route continuous. 1 each 11  ? Continuous Blood Gluc Sensor (DEXCOM G6 SENSOR) MISC 3 Devices by Does not apply route continuous. 3 each 11  ? Continuous Blood Gluc Transmit (DEXCOM G6 TRANSMITTER) MISC 1 Device by Does not apply route continuous. 1 each 11  ? empagliflozin (JARDIANCE) 10 MG TABS tablet Take 1 tablet (10 mg total) by mouth daily before breakfast. 90 tablet 3  ? ezetimibe (ZETIA) 10 MG tablet Take 1 tablet (10 mg total) by mouth daily. 30 tablet 1  ? fluticasone (FLONASE) 50 MCG/ACT nasal spray Place 2 sprays into both nostrils in the morning.    ? Homeopathic Products (MUSCLE THERAPY/ARNICA) GEL Apply 1 application. topically 3 (three) times daily as needed (knee pain.).    ?  isosorbide mononitrate (IMDUR) 30 MG 24 hr tablet Take 3 tablets (90 mg total) by mouth daily. 270 tablet 3  ? lisinopril (ZESTRIL) 10 MG tablet Take 1 tablet (10 mg total) by mouth daily. (Patient taking differently: Take 10 mg by mouth every evening.) 90 tablet 3  ? metFORMIN (GLUCOPHAGE-XR) 500 MG 24 hr tablet Take 500 mg by mouth 2 (two) times daily.    ? metoprolol tartrate (LOPRESSOR) 25 MG tablet Take 1 tablet (25 mg total) by mouth 2 (two) times daily. 180 tablet 3  ? nitroGLYCERIN (NITROSTAT) 0.4 MG SL tablet Place 1 tablet (0.4 mg total) under the tongue every 5 (five) minutes as needed for chest pain. 25 tablet 3  ? pantoprazole (PROTONIX) 40 MG tablet Take 1 tablet (40 mg total) by mouth daily. (Patient taking differently: Take 40 mg by mouth every evening.) 90 tablet 3  ? ?No current facility-administered medications on file prior to visit.  ? ? ?ALLERGIES: ?Allergies  ?Allergen Reactions  ? Alprazolam Other (See Comments)  ?  Caused mood changes  ? Citalopram Hydrobromide Other (See Comments)  ?  Mood changes  ? Nitrofurantoin Monohyd Macro Nausea And Vomiting  ? Statins Other (See Comments)  ?  MUSCLE LOSS   ? Sulfa Drugs Cross Reactors Nausea And Vomiting  ? ? ?FAMILY HISTORY: ?Family History  ?Problem Relation Age of Onset  ? Hyperlipidemia Mother   ? Transient ischemic attack Mother   ? Hypertension Mother   ? Diabetes Mother   ? Transient ischemic attack Father   ? Heart disease Maternal Grandmother 26  ? Hyperlipidemia Maternal Grandmother   ? Hyperlipidemia Maternal Grandfather   ? Heart disease Maternal Grandfather   ? Ovarian cancer Paternal Grandmother   ? Stroke Paternal Grandfather   ? Hypertension Paternal Grandfather   ? Diabetes Paternal Grandfather   ? Sudden death Maternal Uncle   ? ? ?Objective:  ?Blood pressure 101/75, pulse 98, height 5\' 6"  (1.676 m), weight (!) 324 lb 12.8 oz (147.3 kg), last menstrual period 01/06/2012, SpO2 98 %. ?General: No acute distress.  Patient appears  well-groomed.   ?Head:  Normocephalic/atraumatic ?Eyes:  fundi examined but not visualized ?Neck: supple, no paraspinal tenderness, full range of motion ?Heart: regular rate and rhythm ?Vascular: No carotid b

## 2021-12-29 ENCOUNTER — Ambulatory Visit: Payer: BC Managed Care – PPO | Admitting: Neurology

## 2021-12-31 DIAGNOSIS — I2510 Atherosclerotic heart disease of native coronary artery without angina pectoris: Secondary | ICD-10-CM | POA: Diagnosis not present

## 2022-01-01 ENCOUNTER — Telehealth: Payer: Self-pay | Admitting: Internal Medicine

## 2022-01-01 NOTE — Telephone Encounter (Signed)
?  Per MyChart scheduling message: ? ?I am scheduled to have triple bypass on May 1 at [place] know if I need to keep my appointment with Dr. Ali Lowe or not. Please advise.  ?

## 2022-01-01 NOTE — Telephone Encounter (Signed)
Patient prefers to follow up with Dr. Lynnette Caffey for cardiology.  ?

## 2022-01-01 NOTE — Telephone Encounter (Signed)
Spoke with the patient who is having a bypass at [facility] in May. She states that she is unsure if she is going to be following up with cardiology there. She would like to know if she needs to keep her FU with Dr. Roby Lofts. I advised that we can most likely cancel that follow up here if she is going to be following up with [facility]. Patient does not have a preference on which cardiology group that she sees.  ?

## 2022-01-02 ENCOUNTER — Ambulatory Visit: Payer: BC Managed Care – PPO | Admitting: Internal Medicine

## 2022-01-05 DIAGNOSIS — I2510 Atherosclerotic heart disease of native coronary artery without angina pectoris: Secondary | ICD-10-CM | POA: Diagnosis not present

## 2022-01-05 DIAGNOSIS — E119 Type 2 diabetes mellitus without complications: Secondary | ICD-10-CM | POA: Diagnosis not present

## 2022-01-05 DIAGNOSIS — Z01810 Encounter for preprocedural cardiovascular examination: Secondary | ICD-10-CM | POA: Diagnosis not present

## 2022-01-05 DIAGNOSIS — E669 Obesity, unspecified: Secondary | ICD-10-CM | POA: Diagnosis not present

## 2022-01-05 DIAGNOSIS — Z01818 Encounter for other preprocedural examination: Secondary | ICD-10-CM | POA: Diagnosis not present

## 2022-01-09 ENCOUNTER — Encounter: Payer: Self-pay | Admitting: Vascular Surgery

## 2022-01-09 ENCOUNTER — Ambulatory Visit: Payer: BC Managed Care – PPO | Admitting: Vascular Surgery

## 2022-01-09 VITALS — BP 137/81 | HR 74 | Temp 97.9°F | Resp 16 | Ht 66.0 in | Wt 323.0 lb

## 2022-01-09 DIAGNOSIS — I6523 Occlusion and stenosis of bilateral carotid arteries: Secondary | ICD-10-CM | POA: Diagnosis not present

## 2022-01-09 NOTE — Progress Notes (Signed)
? ? ?Office Note  ? ?CC: Asymptomatic bilateral carotid artery stenosis ?Requesting Provider:  Katherina Mires, MD ? ?HPI: Penny Huerta is a 56 y.o. (03/16/1966) female presenting at the request of .Katherina Mires, MD for asymptomatic bilateral carotid artery stenosis. ? ?Patient was evaluated in the ED last month with concern for stroke.  Work-up demonstrated no focal deficits nor new lesions in the brain, however there were several chronic areas of small vessel disease.  The event was believed to be a migraine with aura. CT angiography of the head neck were undertaken as part of her work-up demonstrating bilateral carotid artery stenosis right greater than left. ? ?She presents to the office today accompanied by her husband.  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  Interestingly, her company supplies toning to our office.  Penny Huerta denies symptoms of TIA, stroke, amaurosis.  She has had no more events.  ? ?While in the hospital, Michael noted severe angina, and was found to have multivessel coronary artery disease.  She was not offered CABG at [facility], however went for second opinion at [facility], and is scheduled for CABG with Dr. Jomarie Longs next week. ? ?The pt is  on a statin for cholesterol management.  ?The pt is  on a daily aspirin.   Other AC:  - ?The pt is  on medication for hypertension.   ?The pt is  diabetic.  ?Tobacco hx:  - ? ?Past Medical History:  ?Diagnosis Date  ? Allergy   ? Anxiety   ? Atrial fibrillation ([place])   ? Diabetes mellitus without complication ([place])   ? Heart murmur   ? ARRHYTHMIA  ? History of drug use   ? Hyperlipidemia   ? Hypertension   ? ? ?Past Surgical History:  ?Procedure Laterality Date  ? HYSTEROSCOPY W/ ENDOMETRIAL ABLATION  2011  ? LEFT HEART CATH AND CORONARY ANGIOGRAPHY N/A 12/05/2021  ? Procedure: LEFT HEART CATH AND CORONARY ANGIOGRAPHY;  Surgeon: Early Osmond, MD;  Location: [place] [facility];  Service: Cardiovascular;  Laterality: N/A;  ? ? ?Social History  ? ?Socioeconomic  History  ? Marital status: Married  ?  Spouse name: Not on file  ? Number of children: Not on file  ? Years of education: Not on file  ? Highest education level: Not on file  ?Occupational History  ? Occupation: Glass blower/designer  ?Tobacco Use  ? Smoking status: Former  ?  Years: 20.00  ?  Types: Cigarettes  ?  Quit date: 09/14/1997  ?  Years since quitting: 24.3  ? Smokeless tobacco: Never  ?Substance and Sexual Activity  ? Alcohol use: Not Currently  ?  Comment: h/o heavy use in 76s  ? Drug use: Not Currently  ?  Comment: "most of them" - quit in 1994  ? Sexual activity: Yes  ?  Partners: Male  ?Other Topics Concern  ? Not on file  ?Social History Narrative  ? Exercise-- none  ? Right handed  ? ?Social Determinants of Health  ? ?Financial Resource Strain: Not on file  ?Food Insecurity: Not on file  ?Transportation Needs: Not on file  ?Physical Activity: Not on file  ?Stress: Not on file  ?Social Connections: Not on file  ?Intimate Partner Violence: Not on file  ? ?Family History  ?Problem Relation Age of Onset  ? Stroke Mother   ? Hyperlipidemia Mother   ? Transient ischemic attack Mother   ? Hypertension Mother   ? Diabetes Mother   ?  Stroke Father   ? Transient ischemic attack Father   ? Sudden death Maternal Uncle   ? Heart disease Maternal Grandmother 26  ? Hyperlipidemia Maternal Grandmother   ? Hyperlipidemia Maternal Grandfather   ? Heart disease Maternal Grandfather   ? Ovarian cancer Paternal Grandmother   ? Stroke Paternal Grandfather   ? Hypertension Paternal Grandfather   ? Diabetes Paternal Grandfather   ? ? ?Current Outpatient Medications  ?Medication Sig Dispense Refill  ? aspirin EC 81 MG tablet Take 1 tablet (81 mg total) by mouth in the morning. Swallow whole. 90 tablet 0  ? Continuous Blood Gluc Receiver (DEXCOM G6 RECEIVER) DEVI 1 Device by Does not apply route continuous. 1 each 11  ? Continuous Blood Gluc Sensor (DEXCOM G6 SENSOR) MISC 3 Devices by Does not apply route continuous. 3 each 11  ?  Continuous Blood Gluc Transmit (DEXCOM G6 TRANSMITTER) MISC 1 Device by Does not apply route continuous. 1 each 11  ? empagliflozin (JARDIANCE) 10 MG TABS tablet Take 1 tablet (10 mg total) by mouth daily before breakfast. 90 tablet 3  ? fluticasone (FLONASE) 50 MCG/ACT nasal spray Place 2 sprays into both nostrils in the morning.    ? Homeopathic Products (MUSCLE THERAPY/ARNICA) GEL Apply 1 application. topically 3 (three) times daily as needed (knee pain.).    ? isosorbide mononitrate (IMDUR) 30 MG 24 hr tablet Take 3 tablets (90 mg total) by mouth daily. 270 tablet 3  ? lisinopril (ZESTRIL) 10 MG tablet Take 1 tablet (10 mg total) by mouth daily. (Patient taking differently: Take 10 mg by mouth every evening.) 90 tablet 3  ? metFORMIN (GLUCOPHAGE-XR) 500 MG 24 hr tablet Take 500 mg by mouth 2 (two) times daily.    ? metoprolol tartrate (LOPRESSOR) 25 MG tablet Take 1 tablet (25 mg total) by mouth 2 (two) times daily. 180 tablet 3  ? pantoprazole (PROTONIX) 40 MG tablet Take 1 tablet (40 mg total) by mouth daily. (Patient taking differently: Take 40 mg by mouth every evening.) 90 tablet 3  ? ezetimibe (ZETIA) 10 MG tablet Take 1 tablet (10 mg total) by mouth daily. 30 tablet 1  ? nitroGLYCERIN (NITROSTAT) 0.4 MG SL tablet Place 1 tablet (0.4 mg total) under the tongue every 5 (five) minutes as needed for chest pain. 25 tablet 3  ? ?No current facility-administered medications for this visit.  ? ? ?Allergies  ?Allergen Reactions  ? Alprazolam Other (See Comments)  ?  Caused mood changes  ? Citalopram Hydrobromide Other (See Comments)  ?  Mood changes  ? Nitrofurantoin Monohyd Macro Nausea And Vomiting  ? Statins Other (See Comments)  ?  MUSCLE LOSS   ? Sulfa Drugs Cross Reactors Nausea And Vomiting  ? ? ? ?REVIEW OF SYSTEMS:  ?[X]  denotes positive finding, [ ]  denotes negative finding ?Cardiac  Comments:  ?Chest pain or chest pressure:    ?Shortness of breath upon exertion:    ?Short of breath when lying flat:     ?Irregular heart rhythm:    ?    ?Vascular    ?Pain in calf, thigh, or hip brought on by ambulation:    ?Pain in feet at night that wakes you up from your sleep:     ?Blood clot in your veins:    ?Leg swelling:     ?    ?Pulmonary    ?Oxygen at home:    ?Productive cough:     ?Wheezing:     ?    ?  Neurologic    ?Sudden weakness in arms or legs:     ?Sudden numbness in arms or legs:     ?Sudden onset of difficulty speaking or slurred speech:    ?Temporary loss of vision in one eye:     ?Problems with dizziness:     ?    ?Gastrointestinal    ?Blood in stool:     ?Vomited blood:     ?    ?Genitourinary    ?Burning when urinating:     ?Blood in urine:    ?    ?Psychiatric    ?Major depression:     ?    ?Hematologic    ?Bleeding problems:    ?Problems with blood clotting too easily:    ?    ?Skin    ?Rashes or ulcers:    ?    ?Constitutional    ?Fever or chills:    ? ? ?PHYSICAL EXAMINATION: ? ?Vitals:  ? 01/09/22 1025 01/09/22 1029  ?BP: 124/74 137/81  ?Pulse: 74 74  ?Resp: 16   ?Temp: 97.9 ?F (36.6 ?C)   ?TempSrc: Temporal   ?SpO2: 95%   ?Weight: (!) 323 lb (146.5 kg)   ?Height: 5\' 6"  (1.676 m)   ? ? ?General: Morbid obesity, pleasant ?Gait: Not observed ?HENT: WNL, normocephalic ?Pulmonary: normal non-labored breathing , without wheezing ?Cardiac: regular HR ?Abdomen: soft, NT, no masses ?Skin: without rashes ?Vascular Exam/Pulses: ? Right Left  ?Radial 2+ (normal) 2+ (normal)  ?Ulnar 2+ (normal) 2+ (normal)  ?    ?    ?    ?    ? ?Extremities: without ischemic changes, without Gangrene , without cellulitis; without open wounds;  ?Musculoskeletal: no muscle wasting or atrophy  ?Neurologic: A&O X 3;  No focal weakness or paresthesias are detected ?Psychiatric:  The pt has Normal affect. ? ? ?Non-Invasive Vascular Imaging:   ? ?IMPRESSION: ?1. Suboptimal study but still positive for flow limiting stenosis at ?the bilateral proximal ICA due to atheromatous plaque, at least 70% ?on the left and severe/non-measurable on  the right. ?2. Continuation through the circle-of[place] shows advanced ?intracranial stenosis with advanced bilateral PCA and M2 narrowings. ? ? ? ?ASSESSMENT/PLAN: Penny Huerta is a 56 y.o. female present

## 2022-01-12 DIAGNOSIS — D62 Acute posthemorrhagic anemia: Secondary | ICD-10-CM | POA: Diagnosis not present

## 2022-01-12 DIAGNOSIS — R0689 Other abnormalities of breathing: Secondary | ICD-10-CM | POA: Diagnosis not present

## 2022-01-12 DIAGNOSIS — E119 Type 2 diabetes mellitus without complications: Secondary | ICD-10-CM | POA: Diagnosis not present

## 2022-01-12 DIAGNOSIS — Z8673 Personal history of transient ischemic attack (TIA), and cerebral infarction without residual deficits: Secondary | ICD-10-CM | POA: Diagnosis not present

## 2022-01-12 DIAGNOSIS — I2510 Atherosclerotic heart disease of native coronary artery without angina pectoris: Secondary | ICD-10-CM | POA: Diagnosis not present

## 2022-01-12 DIAGNOSIS — Z4659 Encounter for fitting and adjustment of other gastrointestinal appliance and device: Secondary | ICD-10-CM | POA: Diagnosis not present

## 2022-01-12 DIAGNOSIS — I351 Nonrheumatic aortic (valve) insufficiency: Secondary | ICD-10-CM | POA: Diagnosis not present

## 2022-01-12 DIAGNOSIS — I10 Essential (primary) hypertension: Secondary | ICD-10-CM | POA: Diagnosis not present

## 2022-01-12 DIAGNOSIS — I480 Paroxysmal atrial fibrillation: Secondary | ICD-10-CM | POA: Diagnosis not present

## 2022-01-12 DIAGNOSIS — I350 Nonrheumatic aortic (valve) stenosis: Secondary | ICD-10-CM | POA: Diagnosis not present

## 2022-01-12 DIAGNOSIS — I491 Atrial premature depolarization: Secondary | ICD-10-CM | POA: Diagnosis not present

## 2022-01-12 DIAGNOSIS — Z6841 Body Mass Index (BMI) 40.0 and over, adult: Secondary | ICD-10-CM | POA: Diagnosis not present

## 2022-01-12 DIAGNOSIS — I361 Nonrheumatic tricuspid (valve) insufficiency: Secondary | ICD-10-CM | POA: Diagnosis not present

## 2022-01-12 DIAGNOSIS — Z79899 Other long term (current) drug therapy: Secondary | ICD-10-CM | POA: Diagnosis not present

## 2022-01-12 DIAGNOSIS — Z7982 Long term (current) use of aspirin: Secondary | ICD-10-CM | POA: Diagnosis not present

## 2022-01-12 DIAGNOSIS — G8918 Other acute postprocedural pain: Secondary | ICD-10-CM | POA: Diagnosis not present

## 2022-01-12 DIAGNOSIS — I358 Other nonrheumatic aortic valve disorders: Secondary | ICD-10-CM | POA: Diagnosis not present

## 2022-01-12 DIAGNOSIS — J90 Pleural effusion, not elsewhere classified: Secondary | ICD-10-CM | POA: Diagnosis not present

## 2022-01-12 DIAGNOSIS — Z7902 Long term (current) use of antithrombotics/antiplatelets: Secondary | ICD-10-CM | POA: Diagnosis not present

## 2022-01-12 DIAGNOSIS — E785 Hyperlipidemia, unspecified: Secondary | ICD-10-CM | POA: Diagnosis not present

## 2022-01-12 DIAGNOSIS — I499 Cardiac arrhythmia, unspecified: Secondary | ICD-10-CM | POA: Diagnosis not present

## 2022-01-12 DIAGNOSIS — I672 Cerebral atherosclerosis: Secondary | ICD-10-CM | POA: Diagnosis not present

## 2022-01-12 DIAGNOSIS — K219 Gastro-esophageal reflux disease without esophagitis: Secondary | ICD-10-CM | POA: Diagnosis not present

## 2022-01-12 DIAGNOSIS — Z951 Presence of aortocoronary bypass graft: Secondary | ICD-10-CM | POA: Diagnosis not present

## 2022-01-12 DIAGNOSIS — I700 Atherosclerosis of aorta: Secondary | ICD-10-CM | POA: Diagnosis not present

## 2022-01-12 DIAGNOSIS — Z7984 Long term (current) use of oral hypoglycemic drugs: Secondary | ICD-10-CM | POA: Diagnosis not present

## 2022-01-12 DIAGNOSIS — F40240 Claustrophobia: Secondary | ICD-10-CM | POA: Diagnosis not present

## 2022-01-12 DIAGNOSIS — Z4682 Encounter for fitting and adjustment of non-vascular catheter: Secondary | ICD-10-CM | POA: Diagnosis not present

## 2022-01-16 ENCOUNTER — Other Ambulatory Visit: Payer: Self-pay | Admitting: *Deleted

## 2022-01-16 DIAGNOSIS — I6523 Occlusion and stenosis of bilateral carotid arteries: Secondary | ICD-10-CM

## 2022-01-16 DIAGNOSIS — M79606 Pain in leg, unspecified: Secondary | ICD-10-CM

## 2022-01-20 ENCOUNTER — Encounter: Payer: Self-pay | Admitting: Internal Medicine

## 2022-01-20 ENCOUNTER — Telehealth (INDEPENDENT_AMBULATORY_CARE_PROVIDER_SITE_OTHER): Payer: BC Managed Care – PPO | Admitting: Internal Medicine

## 2022-01-20 VITALS — HR 75 | Ht 66.0 in | Wt 315.0 lb

## 2022-01-20 DIAGNOSIS — T466X5A Adverse effect of antihyperlipidemic and antiarteriosclerotic drugs, initial encounter: Secondary | ICD-10-CM | POA: Insufficient documentation

## 2022-01-20 DIAGNOSIS — I6523 Occlusion and stenosis of bilateral carotid arteries: Secondary | ICD-10-CM

## 2022-01-20 DIAGNOSIS — I2510 Atherosclerotic heart disease of native coronary artery without angina pectoris: Secondary | ICD-10-CM | POA: Diagnosis not present

## 2022-01-20 DIAGNOSIS — E7801 Familial hypercholesterolemia: Secondary | ICD-10-CM

## 2022-01-20 DIAGNOSIS — Z951 Presence of aortocoronary bypass graft: Secondary | ICD-10-CM | POA: Diagnosis not present

## 2022-01-20 DIAGNOSIS — M791 Myalgia, unspecified site: Secondary | ICD-10-CM | POA: Diagnosis not present

## 2022-01-20 MED ORDER — REPATHA SURECLICK 140 MG/ML ~~LOC~~ SOAJ: 1.0000 | SUBCUTANEOUS | 3 refills | Status: DC

## 2022-01-20 NOTE — Patient Instructions (Signed)
Medication Instructions:  ?Dr. Rennis Golden recommends Repatha 140mg /mL (PCSK9). This is an injectable cholesterol medication self-administered once every 14 days. This medication will likely need prior approval with your insurance company, which we will work on. If the medication is not approved initially, we may need to do an appeal with your insurance.  ? ?Administer medication in area of fatty tissue such as abdomen, outer thigh, back of upper arm - and rotate site with each injection ?Store medication in refrigerator until ready to administer - allow to sit at room temp for 30 mins - 1 hour prior to injection ?Dispose of medication in a SHARPS container - your pharmacy should be able to direct you on this and proper disposal  ? ?If you need a co-pay card for Repatha: >> paying for Repatha or red box that says "Repatha Copay Card" in top right ?If you need a co-pay card for Praluent: [email or web address] >> starting & paying for Praluent ? ?Patient Assistance:   ?The PAN Foundation: https://www.panfoundation.org/disease-funds/hypercholesterolemia/ ?-- can sign up for wait list ? ?The [employer] foundation offers assistance to help pay for medication copays.  They will cover copays for all cholesterol lowering meds, including statins, fibrates, omega-3 fish oils like Vascepa, ezetimibe, Repatha, Praluent, Nexletol, Nexlizet.  The cards are usually good for $2,500 or 12 months, whichever comes first. ?Go to healthwellfoundation.org ?Click on ?Apply Now? ?Answer questions as to whom is applying (patient or representative) ?Your disease fund will be ?hypercholesterolemia - Medicare access? ?They will ask questions about finances and which medications you are taking for cholesterol ?When you submit, the approval is usually within minutes.  You will need to print the card information from the site ?You will need to show this information to your pharmacy, they will bill your Medicare Part D plan first -then bill Health  Well --for the copay.   ?You can also call them at [phone], although the hold times can be quite long.  ? ? ? ?*If you need a refill on your cardiac medications before your next appointment, please call your pharmacy* ? ? ?Lab Work: ?FASTING lab work to check cholesterol in 3-4 months ?-- complete about 1 week before your next appointment  ? ?If you have labs (blood work) drawn today and your tests are completely normal, you will receive your results only by: ?MyChart Message (if you have MyChart) OR ?A paper copy in the mail ?If you have any lab test that is abnormal or we need to change your treatment, we will call you to review the results. ? ? ?Testing/Procedures: ?NONE ? ? ?Follow-Up: ?At [facility], you and your health needs are our priority.  As part of our continuing mission to provide you with exceptional heart care, we have created designated Provider Care Teams.  These Care Teams include your primary Cardiologist (physician) and Advanced Practice Providers (APPs -  Physician Assistants and Nurse Practitioners) who all work together to provide you with the care you need, when you need it. ? ?We recommend signing up for the patient portal called "MyChart".  Sign up information is provided on this After Visit Summary.  MyChart is used to connect with patients for Virtual Visits (Telemedicine).  Patients are able to view lab/test results, encounter notes, upcoming appointments, etc.  Non-urgent messages can be sent to your provider as well.   ?To learn more about what you can do with MyChart, go to [facility].   ? ?Your next appointment:   ?3-4 months with Dr. [email or web address]  in the office ?

## 2022-01-20 NOTE — Progress Notes (Signed)
Virtual Visit via Video Note   This visit type was conducted due to national recommendations for restrictions regarding the COVID-19 Pandemic (e.g. social distancing) in an effort to limit this patient's exposure and mitigate transmission in our community.  Due to her co-morbid illnesses, this patient is at least at moderate risk for complications without adequate follow up.  This format is felt to be most appropriate for this patient at this time.  All issues noted in this document were discussed and addressed.  A limited physical exam was performed with this format.  Please refer to the patient's chart for her consent to telehealth for [facility].      Date:  01/20/2022   ID:  Gwynneth Macleod, DOB Jun 25, 1966, MRN [id number] The patient was identified using 2 identifiers.  Evaluation Performed:  Follow-Up Visit  Patient Location:  [address] [place] [address]  Provider location:   [address], [address] [place], [place] [address]  PCP:  [name], MD  Cardiologist:  None Electrophysiologist:  None   Chief Complaint: Follow-up dyslipidemia  History of Present Illness:    DEARRA COBOS is a 56 y.o. female who presents via audio/video conferencing for a telehealth visit today.  This is a pleasant 56 year old female that I met initially in the hospital in March at the time when she presented with chest pain.  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  Percutaneous intervention versus medical therapy was recommended.  This subsequently was reviewed by Dr. Lynnette Caffey and she ultimately sought out another opinion at [facility].  She then underwent coronary artery bypass grafting x 4 with Dr. Nevada Crane about a week ago, this included SVG to the PDA, SVG to OM1, SVG to first diagonal and LIMA to LAD.  She is tolerated this well although is in some  discomfort.  Prior to surgery, I had advised her to start on ezetimibe because of statin intolerance.  She has had a very high cholesterol and probable familial hyperlipidemia.  LDL cholesterol has been consistently in the mid 200s more recently with LDL 249 off treatment.  So far she seems to be tolerating ezetimibe but could not take the statins due to significant myalgias.  She is a very good candidate for PCSK9 inhibitor.  The patient does not have symptoms concerning for COVID-19 infection (fever, chills, cough, or new SHORTNESS OF BREATH).    Prior CV studies:   The following studies were reviewed today:  Chart reviewed, lab work  PMHx:  Past Medical History:  Diagnosis Date   Allergy    Anxiety    Atrial fibrillation (HCC)    Diabetes mellitus without complication (HCC)    Heart murmur    ARRHYTHMIA   History of drug use    Hyperlipidemia    Hypertension     Past Surgical History:  Procedure Laterality Date   HYSTEROSCOPY W/ ENDOMETRIAL ABLATION  2011   LEFT HEART CATH AND CORONARY ANGIOGRAPHY N/A 12/05/2021   Procedure: LEFT HEART CATH AND CORONARY ANGIOGRAPHY;  Surgeon: Orbie Pyo, MD;  Location: [facility] INVASIVE CV LAB;  Service: Cardiovascular;  Laterality: N/A;    FAMHx:  Family History  Problem Relation Age of Onset   Stroke Mother    Hyperlipidemia Mother    Transient ischemic attack Mother    Hypertension Mother    Diabetes Mother  Stroke Father    Transient ischemic attack Father    Sudden death Maternal Uncle    Heart disease Maternal Grandmother 26   Hyperlipidemia Maternal Grandmother    Hyperlipidemia Maternal Grandfather    Heart disease Maternal Grandfather    Ovarian cancer Paternal Grandmother    Stroke Paternal Grandfather    Hypertension Paternal Grandfather    Diabetes Paternal Grandfather     SOCHx:   reports that she quit smoking about 24 years ago. Her smoking use included cigarettes. She has never used smokeless tobacco. She  reports that she does not currently use alcohol. She reports that she does not currently use drugs.  ALLERGIES:  Allergies  Allergen Reactions   Alprazolam Other (See Comments)    Caused mood changes   Citalopram Hydrobromide Other (See Comments)    Mood changes   Nitrofurantoin Monohyd Macro Nausea And Vomiting   Statins Other (See Comments)    MUSCLE LOSS - lipitor, zocor   Sulfa Drugs Cross Reactors Nausea And Vomiting    MEDS:  Current Meds  Medication Sig   aspirin EC 81 MG tablet Take 1 tablet (81 mg total) by mouth in the morning. Swallow whole.   clopidogrel (PLAVIX) 75 MG tablet Take 1 tablet by mouth daily.   Continuous Blood Gluc Receiver (DEXCOM G6 RECEIVER) DEVI 1 Device by Does not apply route continuous.   Continuous Blood Gluc Sensor (DEXCOM G6 SENSOR) MISC 3 Devices by Does not apply route continuous.   Continuous Blood Gluc Transmit (DEXCOM G6 TRANSMITTER) MISC 1 Device by Does not apply route continuous.   empagliflozin (JARDIANCE) 10 MG TABS tablet Take 1 tablet (10 mg total) by mouth daily before breakfast.   Evolocumab (REPATHA SURECLICK) 140 MG/ML SOAJ Inject 1 Dose into the skin every 14 (fourteen) days.   fluticasone (FLONASE) 50 MCG/ACT nasal spray Place 2 sprays into both nostrils in the morning.   guaiFENesin (MUCINEX) 600 MG 12 hr tablet Take 1 tablet by mouth 2 (two) times daily as needed.   Homeopathic Products (MUSCLE THERAPY/ARNICA) GEL Apply 1 application. topically 3 (three) times daily as needed (knee pain.).   lisinopril (ZESTRIL) 2.5 MG tablet Take 2.5 mg by mouth daily.   metFORMIN (GLUCOPHAGE-XR) 500 MG 24 hr tablet Take 500 mg by mouth 2 (two) times daily.   metoprolol tartrate (LOPRESSOR) 25 MG tablet Take 37.5 mg by mouth 2 (two) times daily.   pantoprazole (PROTONIX) 40 MG tablet Take 1 tablet (40 mg total) by mouth daily. (Patient taking differently: Take 40 mg by mouth every evening.)     ROS: Pertinent items noted in HPI and remainder  of comprehensive ROS otherwise negative.  Labs/Other Tests and Data Reviewed:    Recent Labs: 12/02/2021: BUN 20; Creatinine, Ser 0.74; Hemoglobin 12.8; Platelets 341; Potassium 4.3; Sodium 139   Recent Lipid Panel Lab Results  Component Value Date/Time   CHOL 314 (H) 12/06/2021 07:42 AM   CHOL 340 (H) 03/05/2021 10:33 AM   TRIG 312 (H) 12/06/2021 07:42 AM   HDL 25 (L) 12/06/2021 07:42 AM   HDL 26 (L) 03/05/2021 10:33 AM   CHOLHDL 12.6 12/06/2021 07:42 AM   LDLCALC 227 (H) 12/06/2021 07:42 AM   LDLCALC 249 (H) 03/05/2021 10:33 AM   LDLDIRECT 214.4 03/10/2013 11:09 AM    Wt Readings from Last 3 Encounters:  01/20/22 (!) 315 lb (142.9 kg)  01/09/22 (!) 323 lb (146.5 kg)  12/18/21 (!) 324 lb 12.8 oz (147.3 kg)     Exam:  Vital Signs:  Pulse 75   Ht 5\' 6"  (1.676 m)   Wt (!) 315 lb (142.9 kg)   LMP 01/06/2012   BMI 50.84 kg/m    General appearance: alert, no distress, and morbidly obese Lungs: No visual respiratory difficulty Abdomen: Obese Extremities: No edema, healing midline sternotomy incision Skin: Skin color, texture, turgor normal. No rashes or lesions Neurologic: Grossly normal Psych: Pleasant  ASSESSMENT & PLAN:    CAD status post CABG x4 (LIMA to LAD, SVG to PDA, SVG to OM1 and SVG to diagonal) -01/2022, [facility]-Dr. Nevada Crane Probable familial hyperlipidemia -goal LDL less than 55. Statin intolerant-myalgias Bilateral carotid artery stenosis Morbid obesity Type 2 diabetes Hypertension  Mrs. Celentano is doing well during her early recovery from CABG at [facility] a week ago.  When I saw her in the hospital she was noted to be statin intolerant with a very high LDL cholesterol concerning for familial hyperlipidemia, specially given her younger onset coronary disease.  She has been on ezetimibe now for about 2 months.  Her LDL is quite high and her target LDL is actually less than 55 given concomitant significant carotid artery disease as well as coronary disease  and diabetes, she would be considered very high risk by the most recent October 2023 guidelines.  She will need additional lipid lowering and is a good candidate for PCSK9 inhibitor.  Would recommend pursuing Repatha 140 mg every 2 weeks.  We will reach out for prior authorization.  Plan follow-up with me with a lipid NMR and LP(a) in about 3 to 4 months.  She will continue with ezetimibe as well.  We will plan to pursue genetic testing as well at follow-up for cascade screening of her family.  COVID-19 Education: The signs and symptoms of COVID-19 were discussed with the patient and how to seek care for testing (follow up with PCP or arrange E-visit).  The importance of social distancing was discussed today.  Patient Risk:   After full review of this patients clinical status, I feel that they are at least moderate risk at this time.  Time:   Today, I have spent 30 minutes with the patient with telehealth technology discussing dyslipidemia, coronary bypass grafting, familial hyperlipidemia.     Medication Adjustments/Labs and Tests Ordered: Current medicines are reviewed at length with the patient today.  Concerns regarding medicines are outlined above.   Tests Ordered: Orders Placed This Encounter  Procedures   Lipoprotein A (LPA)   NMR, lipoprofile    Medication Changes: Meds ordered this encounter  Medications   Evolocumab (REPATHA SURECLICK) 140 MG/ML SOAJ    Sig: Inject 1 Dose into the skin every 14 (fourteen) days.    Dispense:  6 mL    Refill:  3    Disposition:  in 3 month(s)  Chrystie Nose, MD, [facility], FACP    [facility]  Medical Director of the Advanced Lipid Disorders &  Cardiovascular Risk Reduction Clinic Diplomate of the American Board of Clinical Lipidology Attending Cardiologist  Direct Dial: [phone]  Fax: [phone]  Website:  [email or web address]  Chrystie Nose, MD  01/20/2022 10:28 AM

## 2022-01-21 ENCOUNTER — Telehealth: Payer: Self-pay | Admitting: Internal Medicine

## 2022-01-21 NOTE — Telephone Encounter (Signed)
PA for Repatha submitted via CMM ?(Key: [id number]) ?Rx #: [id number] ?

## 2022-01-21 NOTE — Telephone Encounter (Signed)
Follow Up: ? ? ? ?Verdis Frederickson from [facility] is calling to see if you received a fax for prior authorization for patient's Repatha? ?

## 2022-01-22 NOTE — Telephone Encounter (Signed)
Please see other telephone encounter.  ? ?Lindell Spar, RN ?  ?  10:56 AM ?Note ?PA for Repatha submitted via CMM ?(Key: [id number]) ?Rx #: [id number]  ?  ? ?

## 2022-01-22 NOTE — Telephone Encounter (Signed)
Medication approved and patient has been notified.  ?

## 2022-01-22 NOTE — Telephone Encounter (Signed)
Medication approved ?Effective from 01/21/2022 through 01/20/2023. ?

## 2022-01-28 DIAGNOSIS — E119 Type 2 diabetes mellitus without complications: Secondary | ICD-10-CM | POA: Diagnosis not present

## 2022-01-28 DIAGNOSIS — D508 Other iron deficiency anemias: Secondary | ICD-10-CM | POA: Diagnosis not present

## 2022-01-28 DIAGNOSIS — Z0000 Encounter for general adult medical examination without abnormal findings: Secondary | ICD-10-CM | POA: Diagnosis not present

## 2022-01-28 DIAGNOSIS — E538 Deficiency of other specified B group vitamins: Secondary | ICD-10-CM | POA: Diagnosis not present

## 2022-01-28 DIAGNOSIS — Z951 Presence of aortocoronary bypass graft: Secondary | ICD-10-CM | POA: Diagnosis not present

## 2022-01-31 HISTORY — PX: CORONARY ARTERY BYPASS GRAFT: SHX141

## 2022-02-02 ENCOUNTER — Encounter (HOSPITAL_BASED_OUTPATIENT_CLINIC_OR_DEPARTMENT_OTHER): Payer: Self-pay | Admitting: Internal Medicine

## 2022-02-02 MED ORDER — EZETIMIBE 10 MG PO TABS: 10.0000 mg | ORAL_TABLET | Freq: Every day | ORAL | 3 refills | Status: DC

## 2022-02-04 IMAGING — MR MR ABDOMEN W/O CM
7 series · 48 of 48 positions shown · non-contrast
Comparison: Ultrasound of the abdomen dated August 05, 2020

CLINICAL DATA: RIGHT upper quadrant pain for 1 day

EXAM:
MRI ABDOMEN WITHOUT CONTRAST
TECHNIQUE: Multiplanar multisequence MR imaging was performed without the
administration of intravenous contrast. Exam was terminated at the
request of the patient. Noncontrast images were obtained of the
abdomen. No biliary evaluation or contrasted imaging was performed.

[Series 3: T2 fat-sat · axial · 6.0mm · 1.41mm/px · z∈[-152,+157]mm · 4 of 44 slices shown]
[im 1/44]
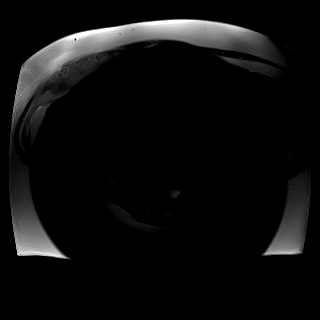
[im 15/44]
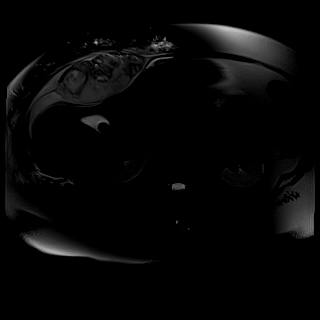
[im 29/44]
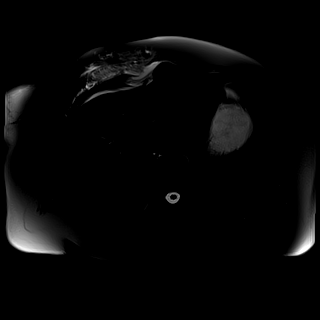
[im 44/44]
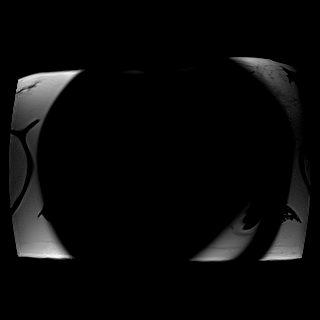

[Series 5: DWI · axial · 6.0mm · 1.68mm/px · z∈[-165,+145]mm · 9 of 88 slices shown (1 of 2)]
[im 1/88]
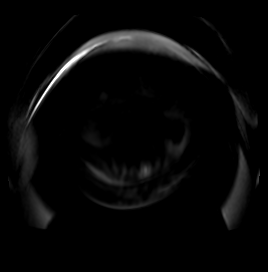
[im 11/88]
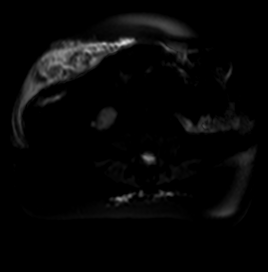
[im 22/88]
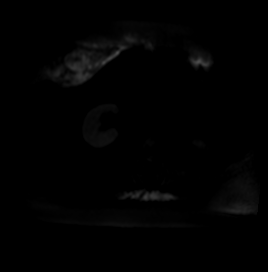
[im 33/88]
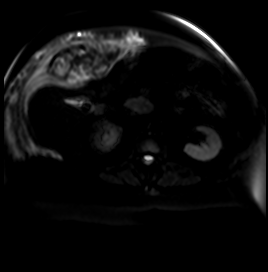
[im 44/88]
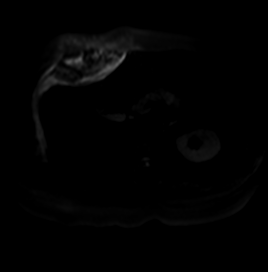
[im 55/88]
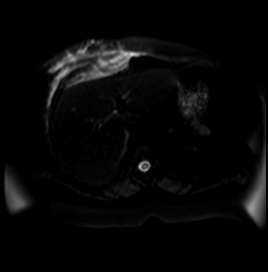
[im 66/88]
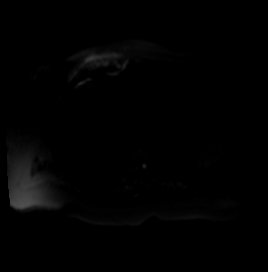
[im 77/88]
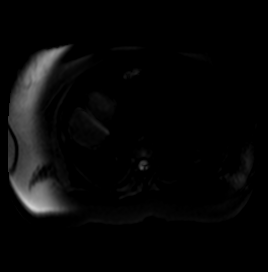
[im 88/88]
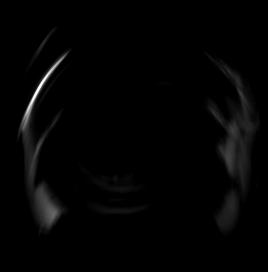

[Series 6: DWI · axial · 6.0mm · 1.68mm/px · z∈[-165,+145]mm · 5 of 44 slices shown (2 of 2)]
[im 1/44]
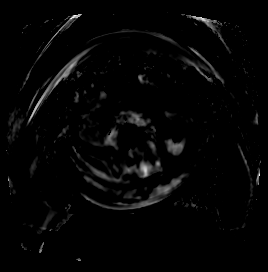
[im 11/44]
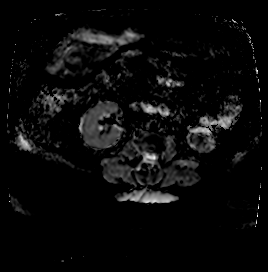
[im 22/44]
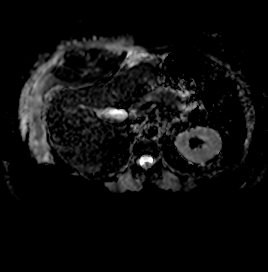
[im 33/44]
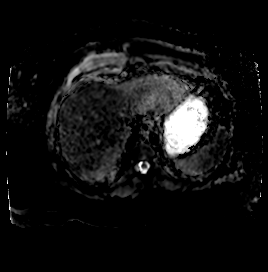
[im 44/44]
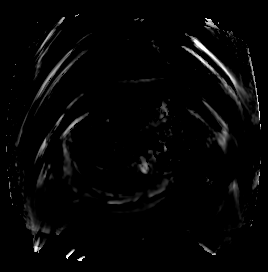

[Series 7: T1 · axial · 3.0mm · 1.41mm/px · z∈[-174,+135]mm · 11 of 104 slices shown (1 of 2)]
[im 1/104]
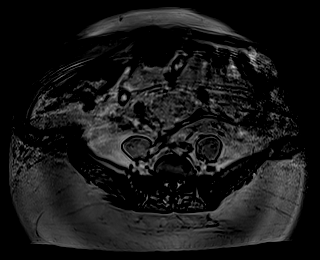
[im 11/104]
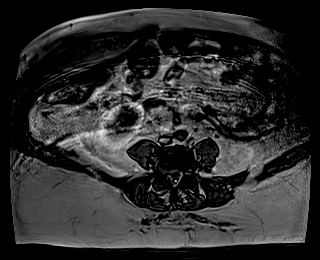
[im 21/104]
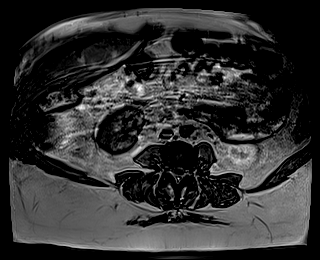
[im 31/104]
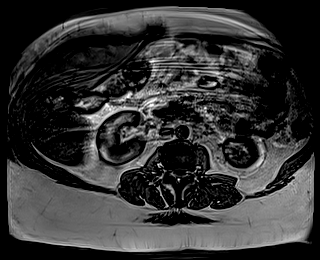
[im 42/104]
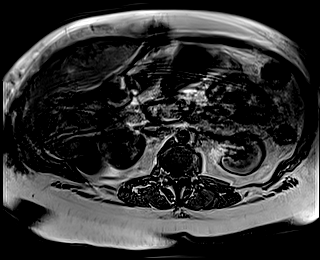
[im 52/104]
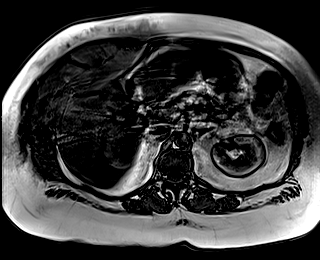
[im 62/104]
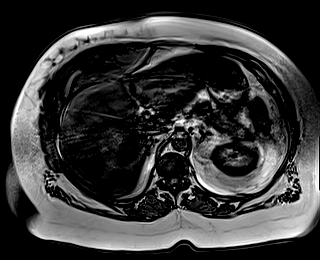
[im 73/104]
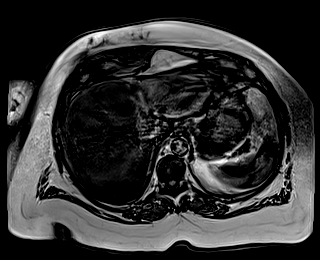
[im 83/104]
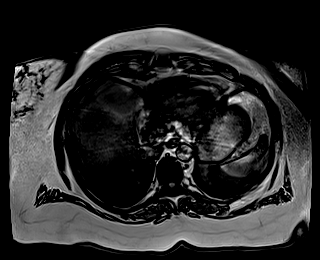
[im 93/104]
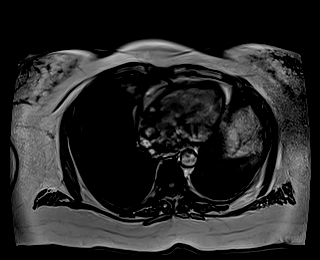
[im 104/104]
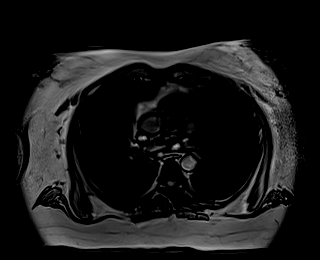

[Series 8: T1 · axial · 3.0mm · 1.41mm/px · z∈[-174,+135]mm · 11 of 104 slices shown (2 of 2)]
[im 1/104]
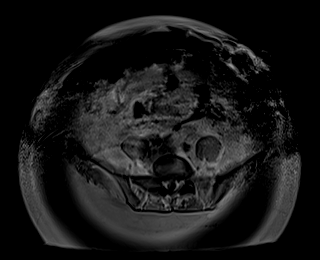
[im 11/104]
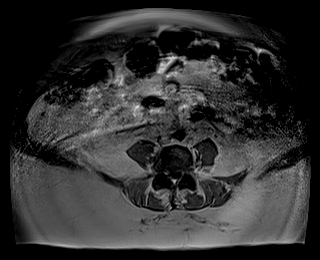
[im 21/104]
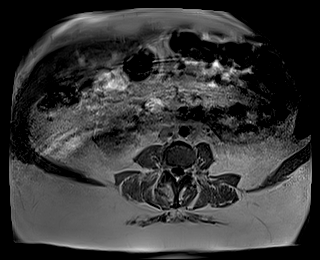
[im 31/104]
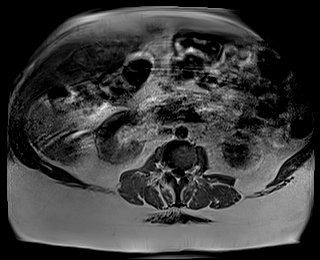
[im 42/104]
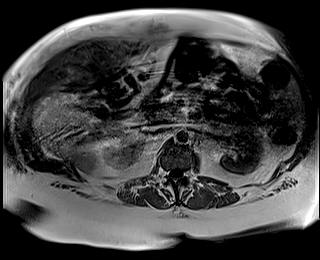
[im 52/104]
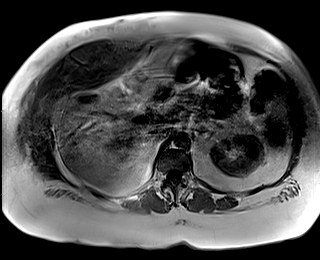
[im 62/104]
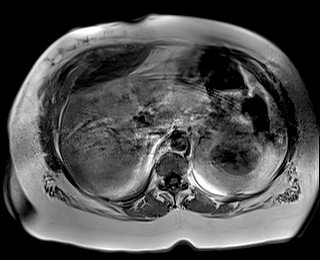
[im 73/104]
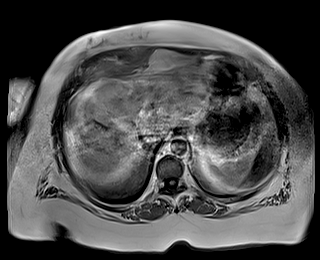
[im 83/104]
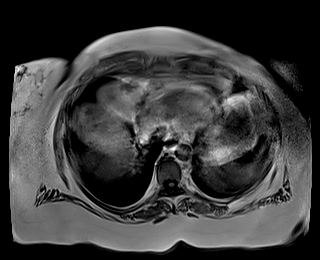
[im 93/104]
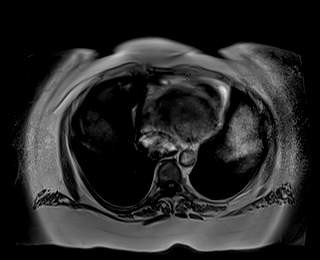
[im 104/104]
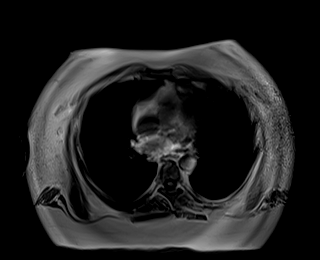

[Series 9: T2 · axial · 6.0mm · 1.76mm/px · z∈[-183,+126]mm · 5 of 44 slices shown (1 of 2)]
[im 1/44]
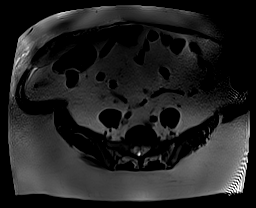
[im 11/44]
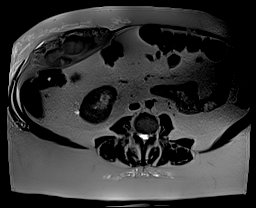
[im 22/44]
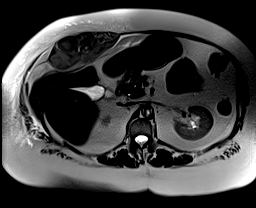
[im 33/44]
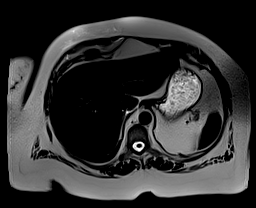
[im 44/44]
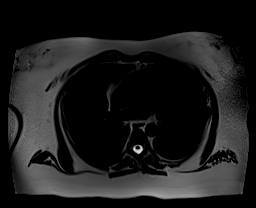

[Series 10: T2 · coronal · 6.0mm · 1.37mm/px · 3 of 30 slices shown (2 of 2)]
[im 1/30]
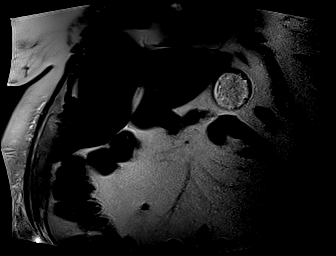
[im 15/30]
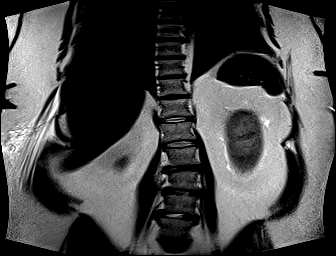
[im 30/30]
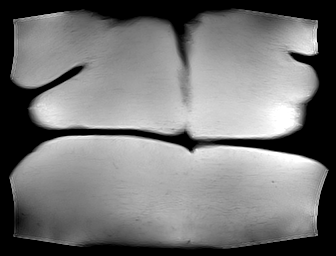

[48 of 48 positions shown; findings below may reference images not displayed]

FINDINGS: Lower chest: Incidental imaging of the lung bases with signs of
basilar airspace disease incompletely evaluated, perhaps
atelectasis.

Hepatobiliary: Hepatic steatosis, at least moderate. Assessment
limited by lack of contrast and respiratory motion. Cholelithiasis.
No biliary duct dilation. The large gallstone in the gallbladder
lumen. No pericholecystic stranding.

Pancreas: Mild heterogeneity in the neck of the pancreas (image 27
of series 3) area measuring approximately 1.8 cm on T2 weighted
imaging. No gross peripancreatic fluid. Question mild heterogeneity
elsewhere in the pancreas. No ductal dilation.

Spleen: Normal in size and contour. No focal splenic lesion. Low
signal on T2 raising the question of iron deposition in the spleen.
Note that in and out of phase imaging is limited by marked artifact
in the area of the spleen not allowing for definitive assessment.

Adrenals/Urinary Tract: Adrenal glands are normal. Kidneys are
normal in size and contour. No gross renal lesion.

Stomach/Bowel: Limited assessment of the gastrointestinal tract
without is acute process. Bowel is incompletely imaged.

Vascular/Lymphatic: Vascular structures of normal caliber. There is
no gastrohepatic or hepatoduodenal ligament lymphadenopathy. No
retroperitoneal or mesenteric lymphadenopathy.

Limited assessment of vascular structures due to lack of intravenous
contrast.

Other: Large intramuscular hematoma approximately 13 x 12 x 7 cm
with edema in the body wall around this hematoma. Mixed signal on T2
weighted imaging with some areas of mild increased T1 signal.

Musculoskeletal: RIGHT rectus hematoma. No focal, suspicious bone
abnormality.
IMPRESSION: 1. Large intramuscular hematoma approximately 13 x 12 x 7 cm with
edema in the body wall around this hematoma. Given lack of contrast
a lesion in this location is not excluded on the basis of the
current study. Follow-up may be helpful to track for resolution
exclude the remote possibility that there is an underlying lesion.
2. Mild heterogeneity in the neck of the pancreas measuring
approximately 1.8 cm area on T2 weighted imaging. This is not well
evaluated given some artifact on the current study and respiratory
motion on T1 weighted imaging. Would suggest follow-up MRI in 4-6
weeks for dedicated pancreatic assessment to exclude underlying
lesion. Would also correlate with lipase in the meantime to exclude
the possibility of mild pancreatitis.
3. Above findings could be followed with either CT or MRI, MRI would
be preferred.
4. Low signal on T2 raising the question of iron deposition in the
spleen. Correlate with any history of exogenous iron administration.
5. Hepatic steatosis, at least moderate.
6. Cholelithiasis. No biliary duct dilation or pericholecystic
stranding.
7. Bibasilar airspace disease, incompletely evaluated, perhaps
atelectasis.

These results were called by telephone at the time of interpretation
on 08/06/2020 at [DATE] to provider Abo Kalil Emambo Francisca, NP, who
verbally acknowledged these results.

## 2022-02-12 ENCOUNTER — Ambulatory Visit: Payer: BC Managed Care – PPO | Admitting: Physician Assistant

## 2022-02-17 DIAGNOSIS — Z48812 Encounter for surgical aftercare following surgery on the circulatory system: Secondary | ICD-10-CM | POA: Diagnosis not present

## 2022-02-17 DIAGNOSIS — Z951 Presence of aortocoronary bypass graft: Secondary | ICD-10-CM | POA: Diagnosis not present

## 2022-02-23 DIAGNOSIS — Z48812 Encounter for surgical aftercare following surgery on the circulatory system: Secondary | ICD-10-CM | POA: Diagnosis not present

## 2022-02-23 DIAGNOSIS — Z951 Presence of aortocoronary bypass graft: Secondary | ICD-10-CM | POA: Diagnosis not present

## 2022-02-25 DIAGNOSIS — Z48812 Encounter for surgical aftercare following surgery on the circulatory system: Secondary | ICD-10-CM | POA: Diagnosis not present

## 2022-02-25 DIAGNOSIS — Z951 Presence of aortocoronary bypass graft: Secondary | ICD-10-CM | POA: Diagnosis not present

## 2022-02-26 ENCOUNTER — Ambulatory Visit: Payer: BC Managed Care – PPO

## 2022-02-26 DIAGNOSIS — Z951 Presence of aortocoronary bypass graft: Secondary | ICD-10-CM | POA: Diagnosis not present

## 2022-02-26 DIAGNOSIS — Z48812 Encounter for surgical aftercare following surgery on the circulatory system: Secondary | ICD-10-CM | POA: Diagnosis not present

## 2022-03-02 DIAGNOSIS — Z48812 Encounter for surgical aftercare following surgery on the circulatory system: Secondary | ICD-10-CM | POA: Diagnosis not present

## 2022-03-02 DIAGNOSIS — Z951 Presence of aortocoronary bypass graft: Secondary | ICD-10-CM | POA: Diagnosis not present

## 2022-03-04 DIAGNOSIS — Z48812 Encounter for surgical aftercare following surgery on the circulatory system: Secondary | ICD-10-CM | POA: Diagnosis not present

## 2022-03-04 DIAGNOSIS — Z951 Presence of aortocoronary bypass graft: Secondary | ICD-10-CM | POA: Diagnosis not present

## 2022-03-05 DIAGNOSIS — Z951 Presence of aortocoronary bypass graft: Secondary | ICD-10-CM | POA: Diagnosis not present

## 2022-03-05 DIAGNOSIS — Z48812 Encounter for surgical aftercare following surgery on the circulatory system: Secondary | ICD-10-CM | POA: Diagnosis not present

## 2022-03-06 DIAGNOSIS — I2510 Atherosclerotic heart disease of native coronary artery without angina pectoris: Secondary | ICD-10-CM | POA: Diagnosis not present

## 2022-03-06 DIAGNOSIS — E119 Type 2 diabetes mellitus without complications: Secondary | ICD-10-CM | POA: Diagnosis not present

## 2022-03-06 DIAGNOSIS — I6523 Occlusion and stenosis of bilateral carotid arteries: Secondary | ICD-10-CM | POA: Diagnosis not present

## 2022-03-06 DIAGNOSIS — Z8673 Personal history of transient ischemic attack (TIA), and cerebral infarction without residual deficits: Secondary | ICD-10-CM | POA: Diagnosis not present

## 2022-03-09 DIAGNOSIS — Z48812 Encounter for surgical aftercare following surgery on the circulatory system: Secondary | ICD-10-CM | POA: Diagnosis not present

## 2022-03-09 DIAGNOSIS — Z951 Presence of aortocoronary bypass graft: Secondary | ICD-10-CM | POA: Diagnosis not present

## 2022-03-11 DIAGNOSIS — Z951 Presence of aortocoronary bypass graft: Secondary | ICD-10-CM | POA: Diagnosis not present

## 2022-03-11 DIAGNOSIS — Z48812 Encounter for surgical aftercare following surgery on the circulatory system: Secondary | ICD-10-CM | POA: Diagnosis not present

## 2022-03-11 NOTE — Progress Notes (Signed)
Office Note    HPI: Penny Huerta is a 56 y.o. ([birth year]) female presenting in follow up with known bilateral carotid artery stenosis R>L.  Since her prior visit, she has undergone CABG at [place] initially presented to my clinic after having a migraine with aura with incidental finding of bilateral carotid artery stenosis, right greater than left.  Time she was found to have severe coronary artery disease.  Was turned down for CABG at [facility], but scheduled at [facility] for the bypass surgery.  Presents today accompanied by her husband.  Her CABG several months ago went well.  She is currently in cardiac rehab.  She denies recent symptoms of TIA, stroke, amaurosis.  While [facility], she was seen by neurosurgery and offered carotid artery stenting.  She had questions regarding this today as well as other options for cerebral revascularization.   The pt is  on a statin for cholesterol management.  The pt is  on a daily aspirin.   Other AC:  plavix The pt is  on medication for hypertension.   The pt is  diabetic.  Tobacco hx:  -  Past Medical History:  Diagnosis Date   Allergy    Anxiety    Atrial fibrillation ([place])    Diabetes mellitus without complication ([place])    Heart murmur    ARRHYTHMIA   History of drug use    Hyperlipidemia    Hypertension     Past Surgical History:  Procedure Laterality Date   HYSTEROSCOPY W/ ENDOMETRIAL ABLATION  2011   LEFT HEART CATH AND CORONARY ANGIOGRAPHY N/A 12/05/2021   Procedure: LEFT HEART CATH AND CORONARY ANGIOGRAPHY;  Surgeon: Early Osmond, MD;  Location: [facility];  Service: Cardiovascular;  Laterality: N/A;    Social History   Socioeconomic History   Marital status: Married    Spouse name: Not on file   Number of children: Not on file   Years of education: Not on file   Highest education level: Not on file  Occupational History   Occupation: Glass blower/designer  Tobacco Use   Smoking status: Former     Years: 20.00    Types: Cigarettes    Quit date: 09/14/1997    Years since quitting: 24.5   Smokeless tobacco: Never  Substance and Sexual Activity   Alcohol use: Not Currently    Comment: h/o heavy use in 55s   Drug use: Not Currently    Comment: "most of them" - quit in 1994   Sexual activity: Yes    Partners: Male  Other Topics Concern   Not on file  Social History Narrative   Exercise-- none   Right handed   Social Determinants of Health   Financial Resource Strain: Not on file  Food Insecurity: Not on file  Transportation Needs: Not on file  Physical Activity: Not on file  Stress: Not on file  Social Connections: Not on file  Intimate Partner Violence: Not on file   Family History  Problem Relation Age of Onset   Stroke Mother    Hyperlipidemia Mother    Transient ischemic attack Mother    Hypertension Mother    Diabetes Mother    Stroke Father    Transient ischemic attack Father    Sudden death Maternal Uncle    Heart disease Maternal Grandmother 26   Hyperlipidemia Maternal Grandmother    Hyperlipidemia Maternal Grandfather    Heart disease Maternal Grandfather  Ovarian cancer Paternal Grandmother    Stroke Paternal Grandfather    Hypertension Paternal Grandfather    Diabetes Paternal Grandfather     Current Outpatient Medications  Medication Sig Dispense Refill   clopidogrel (PLAVIX) 75 MG tablet Take 1 tablet by mouth daily.     Continuous Blood Gluc Receiver (DEXCOM G6 RECEIVER) DEVI 1 Device by Does not apply route continuous. 1 each 11   Continuous Blood Gluc Sensor (DEXCOM G6 SENSOR) MISC 3 Devices by Does not apply route continuous. 3 each 11   Continuous Blood Gluc Transmit (DEXCOM G6 TRANSMITTER) MISC 1 Device by Does not apply route continuous. 1 each 11   empagliflozin (JARDIANCE) 10 MG TABS tablet Take 1 tablet (10 mg total) by mouth daily before breakfast. 90 tablet 3   Evolocumab (REPATHA SURECLICK) 140 MG/ML SOAJ Inject 1 Dose into the  skin every 14 (fourteen) days. 6 mL 3   ezetimibe (ZETIA) 10 MG tablet Take 1 tablet (10 mg total) by mouth daily. 90 tablet 3   fluticasone (FLONASE) 50 MCG/ACT nasal spray Place 2 sprays into both nostrils in the morning.     Homeopathic Products (MUSCLE THERAPY/ARNICA) GEL Apply 1 application. topically 3 (three) times daily as needed (knee pain.).     lisinopril (ZESTRIL) 2.5 MG tablet Take 2.5 mg by mouth daily.     metFORMIN (GLUCOPHAGE-XR) 500 MG 24 hr tablet Take 500 mg by mouth 2 (two) times daily.     metoprolol tartrate (LOPRESSOR) 25 MG tablet Take 37.5 mg by mouth 2 (two) times daily.     pantoprazole (PROTONIX) 40 MG tablet Take 1 tablet (40 mg total) by mouth daily. (Patient taking differently: Take 40 mg by mouth every evening.) 90 tablet 3   No current facility-administered medications for this visit.    Allergies  Allergen Reactions   Alprazolam Other (See Comments)    Caused mood changes   Citalopram Hydrobromide Other (See Comments)    Mood changes   Nitrofurantoin Monohyd Macro Nausea And Vomiting   Statins Other (See Comments)    MUSCLE LOSS - lipitor, zocor   Sulfa Drugs Cross Reactors Nausea And Vomiting     REVIEW OF SYSTEMS:  [X]  denotes positive finding, [ ]  denotes negative finding Cardiac  Comments:  Chest pain or chest pressure:    Shortness of breath upon exertion:    Short of breath when lying flat:    Irregular heart rhythm:        Vascular    Pain in calf, thigh, or hip brought on by ambulation:    Pain in feet at night that wakes you up from your sleep:     Blood clot in your veins:    Leg swelling:         Pulmonary    Oxygen at home:    Productive cough:     Wheezing:         Neurologic    Sudden weakness in arms or legs:     Sudden numbness in arms or legs:     Sudden onset of difficulty speaking or slurred speech:    Temporary loss of vision in one eye:     Problems with dizziness:         Gastrointestinal    Blood in stool:      Vomited blood:         Genitourinary    Burning when urinating:     Blood in urine:  Psychiatric    Major depression:         Hematologic    Bleeding problems:    Problems with blood clotting too easily:        Skin    Rashes or ulcers:        Constitutional    Fever or chills:      PHYSICAL EXAMINATION:  There were no vitals filed for this visit.   General: Morbid obesity, pleasant Gait: Not observed HENT: WNL, normocephalic Pulmonary: normal non-labored breathing , without wheezing Cardiac: regular HR Abdomen: soft, NT, no masses Skin: without rashes Vascular Exam/Pulses:  Right Left  Radial 2+ (normal) 2+ (normal)  Ulnar 2+ (normal) 2+ (normal)                   Extremities: without ischemic changes, without Gangrene , without cellulitis; without open wounds;  Musculoskeletal: no muscle wasting or atrophy  Neurologic: A&O X 3;  No focal weakness or paresthesias are detected Psychiatric:  The pt has Normal affect.   Non-Invasive Vascular Imaging:    IMPRESSION: 1. Suboptimal study but still positive for flow limiting stenosis at the bilateral proximal ICA due to atheromatous plaque, at least 70% on the left and severe/non-measurable on the right. 2. Continuation through the circle-of-Willis shows advanced intracranial stenosis with advanced bilateral PCA and M2 narrowings.    ASSESSMENT/PLAN: KEYLIN FERRYMAN is a 56 y.o. female presenting with bilateral carotid artery stenosis, right greater than left.  CT angio completed in the hospital at the time of her migraine with aura was of poor quality but demonstrated retropharyngeal carotid arteries bilaterally with critical stenosis in the left.  Duplex ultrasonography also demonstrated greater than 80% stenosis.  I had a long discussion with the patient regarding her left-sided internal carotid artery stenosis.  Literature demonstrates a reduction in stroke rate from 11% to 5% in 5 years, effectively  reducing the risk of stroke by half.  Discussed options for revascularization including carotid endarterectomy, transcarotid artery revascularization, carotid artery stenting femoral approach.  He was offered carotid artery stenting from a femoral approach at [facility] and was sided a complication rate of 1%.  Gave her national averages for both carotid endarterectomy as well as transcarotid artery revascularization, both being slightly greater than 1%.  Due to her retropharyngeal carotid arteries, she would not be a good candidate for carotid endarterectomy.  I offered her transcarotid artery revascularization but stated she would have a higher than normal risk profile due to the location of the carotid arteries.  After discussing the risk and benefits, Penny Huerta elected to pursue transfemoral carotid artery stenting at [facility] due to the 1% complication rate, as well as continuity in care as her CABG was completed there.  I told Penny Huerta I am available should any questions or concerns arise and wished her and her husband the best moving forward.  I asked that she continue best medical therapy.   Victorino Sparrow, MD [facility] [phone]

## 2022-03-13 ENCOUNTER — Encounter: Payer: Self-pay | Admitting: Vascular Surgery

## 2022-03-13 ENCOUNTER — Ambulatory Visit: Payer: BC Managed Care – PPO | Admitting: Vascular Surgery

## 2022-03-13 ENCOUNTER — Ambulatory Visit (HOSPITAL_COMMUNITY)
Admission: RE | Admit: 2022-03-13 | Discharge: 2022-03-13 | Disposition: A | Discharge status: Home / Self Care | Payer: BC Managed Care – PPO | Source: Ambulatory Visit | Attending: Vascular Surgery | Admitting: Vascular Surgery

## 2022-03-13 VITALS — BP 135/70 | HR 74 | Temp 98.0°F | Resp 16 | Ht 67.0 in | Wt 321.0 lb

## 2022-03-13 DIAGNOSIS — I6523 Occlusion and stenosis of bilateral carotid arteries: Secondary | ICD-10-CM | POA: Diagnosis not present

## 2022-03-16 DIAGNOSIS — Z951 Presence of aortocoronary bypass graft: Secondary | ICD-10-CM | POA: Diagnosis not present

## 2022-03-16 DIAGNOSIS — Z48812 Encounter for surgical aftercare following surgery on the circulatory system: Secondary | ICD-10-CM | POA: Diagnosis not present

## 2022-03-17 NOTE — Progress Notes (Signed)
Cardiology Office Note:    Date:  03/20/2022   ID:  Penny Huerta, DOB Oct 30, 1965, MRN [id number]  PCP:  [name], MD   [facility] Providers Cardiologist:  Alverda Skeans, MD Referring MD: [name], MD   Chief Complaint/Reason for Referral:  Chest pain  ASSESSMENT:    1. S/P CABG x 4   2. Type 2 diabetes mellitus with complication, without long-term current use of insulin (HCC)   3. Hypertension associated with diabetes (HCC)   4. Hyperlipidemia associated with type 2 diabetes mellitus (HCC)   5. BMI 50.0-59.9, adult (HCC)   6. Bilateral carotid artery stenosis      PLAN:    In order of problems listed above: 1.  Coronary artery disease status post CABG: Continue Plavix for 6 months and then aspirin monotherapy thereafter.  Follow-up in 9 months or earlier if needed 2.  Type 2 diabetes: Continue Jardiance, aspirin, lisinopril, and lipid therapy per Dr. Rennis Golden.   3.  Hypertension: We will stop lisinopril due to cough and start losartan 25 mg every afternoon 4.  Hyperlipidemia: Goal LDL is less than 55 given that she is high risk.  She is feeling being followed by Dr. Rennis Golden.  Have asked her to restart her Repatha 5.  Elevated BMI: We will refer to pharmacy for recommendations regarding pharmacotherapy. 6.  Carotid stenosis: The patient is being followed elsewhere for her carotid disease.  There are plans for carotid revascularization in the near future.   Dispo:  Return in about 9 months (around 12/20/2022).      Medication Adjustments/Labs and Tests Ordered: Current medicines are reviewed at length with the patient today.  Concerns regarding medicines are outlined above.   Tests Ordered: Orders Placed This Encounter  Procedures   AMB Referral to [facility]    Medication Changes: Meds ordered this encounter  Medications   losartan (COZAAR) 25 MG tablet    Sig: Take 1 tablet (25 mg total) by mouth at bedtime.    Dispense:  90 tablet    Refill:  3     History of Present Illness:    FOCUSED PROBLEM LIST: 1.  Coronary artery disease status post CABG with LIMA to LAD, vein graft to first diagonal, vein graft to obtuse marginal 1, and vein graft to PDA 2023 at [facility] 2.  Type 2 diabetes 3.  Hypertension 4.  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 Rennis Golden 5.  BMI 51 6.  Peripheral vascular disease with advanced intracranial atherosclerosis and 70% stenosis of left ICA and severe stenosis of right ICA 7.  Cough due to ACE inhibitor  The patient returns for follow-up.  She was initially seen in January 2023 due to chest pain.  She ultimately underwent cardiac catheterization which demonstrated severe multivessel disease.  Due to her body habitus and carotid disease she was not felt to be a candidate for surgical revascularization at [facility].  She ultimately sought second opinion at [facility] and underwent uncomplicated multivessel CABG in early May 2023.  She also saw neurosurgery there regarding her carotid stenosis with plans to repeat a carotid ultrasound and pursue surgical revascularization if indicated.  Today: The patient is doing well.  She is participating in cardiac rehabilitation.  She has plans to have her left carotid disease treated with a stent.  She feels like she is getting stronger.  She has not been using her Repatha.  She denies any significant chest pain, palpitations, paroxysmal nocturnal dyspnea, orthopnea, or shortness of breath.  She feels very well.  She does report a cough and thinks it may be due to lisinopril.    Previous Medical History: Past Medical History:  Diagnosis Date   Allergy    Anxiety    Atrial fibrillation ([place])    Diabetes mellitus without complication (HCC)    Heart murmur    ARRHYTHMIA   History of drug use    Hyperlipidemia    Hypertension      Current Medications: Current Meds  Medication Sig    acetaminophen (TYLENOL) 500 MG tablet Take 500 mg by mouth every 6 (six) hours as needed for mild pain.   aspirin EC 81 MG tablet Take 81 mg by mouth daily. Swallow whole.   clopidogrel (PLAVIX) 75 MG tablet Take 1 tablet by mouth daily.   empagliflozin (JARDIANCE) 10 MG TABS tablet Take 1 tablet (10 mg total) by mouth daily before breakfast.   Evolocumab (REPATHA SURECLICK) XX123456 MG/ML SOAJ Inject 1 Dose into the skin every 14 (fourteen) days.   ezetimibe (ZETIA) 10 MG tablet Take 1 tablet (10 mg total) by mouth daily.   fluticasone (FLONASE) 50 MCG/ACT nasal spray Place 2 sprays into both nostrils in the morning.   Homeopathic Products (MUSCLE THERAPY/ARNICA) GEL Apply 1 application. topically 3 (three) times daily as needed (knee pain.).   losartan (COZAAR) 25 MG tablet Take 1 tablet (25 mg total) by mouth at bedtime.   metFORMIN (GLUCOPHAGE-XR) 500 MG 24 hr tablet Take 500 mg by mouth 2 (two) times daily.   metoprolol tartrate (LOPRESSOR) 25 MG tablet Take 37.5 mg by mouth 2 (two) times daily.   pantoprazole (PROTONIX) 40 MG tablet Take 1 tablet (40 mg total) by mouth daily. (Patient taking differently: Take 40 mg by mouth every evening.)   [DISCONTINUED] lisinopril (ZESTRIL) 2.5 MG tablet Take 2.5 mg by mouth daily.     Allergies:    Alprazolam, Citalopram hydrobromide, Lisinopril, Nitrofurantoin monohyd macro, Statins, and Sulfa drugs cross reactors   Social History:   Social History   Tobacco Use   Smoking status: Former    Years: 20.00    Types: Cigarettes    Quit date: 09/14/1997    Years since quitting: 24.5   Smokeless tobacco: Never  Substance Use Topics   Alcohol use: Not Currently    Comment: h/o heavy use in 35s   Drug use: Not Currently    Comment: "most of them" - quit in 1994     Family Hx: Family History  Problem Relation Age of Onset   Stroke Mother    Hyperlipidemia Mother    Transient ischemic attack Mother    Hypertension Mother    Diabetes Mother     Stroke Father    Transient ischemic attack Father    Sudden death Maternal Uncle    Heart disease Maternal Grandmother 26   Hyperlipidemia Maternal Grandmother    Hyperlipidemia Maternal Grandfather    Heart disease Maternal Grandfather    Ovarian cancer Paternal Grandmother    Stroke Paternal Grandfather    Hypertension Paternal Grandfather    Diabetes Paternal Grandfather      Review of Systems:   Please see the history of present illness.    All other systems reviewed and are negative.     EKGs/Labs/Other Test Reviewed:    EKG: Not performed today  Prior CV studies:  TTE 2023 ejection fraction 60 to 65% with  mild concentric left ventricular hypertrophy and no significant valvular abnormalities  Imaging studies that I have independently reviewed today:   CTA neck 2023 1. Suboptimal study but still positive for flow limiting stenosis at the bilateral proximal ICA due to atheromatous plaque, at least 70% on the left and severe/non-measurable on the right. 2. Continuation through the circle-of-Willis shows advanced intracranial stenosis with advanced bilateral PCA and M2 narrowings.  CT Head 2023 14 mm infarct at the junction of the right caudate nucleus and right corona radiata/internal capsule, new from the prior head CT of 04/05/2006 but otherwise age-indeterminate. A brain MRI may be obtained for further evaluation, as clinically warranted.   Lacunar infarct within the left basal ganglia/internal capsule, also new from the prior head CT but chronic in appearance.  Recent Labs: 12/02/2021: BUN 20; Creatinine, Ser 0.74; Hemoglobin 12.8; Platelets 341; Potassium 4.3; Sodium 139   Recent Lipid Panel Lab Results  Component Value Date/Time   CHOL 314 (H) 12/06/2021 07:42 AM   CHOL 340 (H) 03/05/2021 10:33 AM   TRIG 312 (H) 12/06/2021 07:42 AM   HDL 25 (L) 12/06/2021 07:42 AM   HDL 26 (L) 03/05/2021 10:33 AM   LDLCALC 227 (H) 12/06/2021 07:42 AM   LDLCALC 249 (H)  03/05/2021 10:33 AM   LDLDIRECT 214.4 03/10/2013 11:09 AM    Risk Assessment/Calculations:           Physical Exam:    VS:  BP 130/72   Pulse 74   Ht 5\' 7"  (1.702 m)   Wt (!) 315 lb 3.2 oz (143 kg)   LMP 01/06/2012   SpO2 96%   BMI 49.37 kg/m    Wt Readings from Last 3 Encounters:  03/20/22 (!) 315 lb 3.2 oz (143 kg)  03/13/22 (!) 321 lb (145.6 kg)  01/20/22 (!) 315 lb (142.9 kg)    GENERAL:  No apparent distress, AOx3 HEENT:  No carotid bruits, +2 carotid impulses, no scleral icterus CAR: RRR no murmurs, gallops, rubs, or thrills; well-healed sternotomy incision RES:  Clear to auscultation bilaterally ABD:  Soft, nontender, nondistended, positive bowel sounds x 4 VASC:  +2 radial pulses, +2 carotid pulses, palpable pedal pulses NEURO:  CN 2-12 grossly intact; motor and sensory grossly intact PSYCH:  No active depression or anxiety EXT:  No edema, ecchymosis, or cyanosis  Signed, 03/22/22, MD  03/20/2022 8:05 AM    [facility] [address] [place], [place], [place]  [place] Phone: [phone]; Fax: [phone]   Note:  This document was prepared using Dragon voice recognition software and may include unintentional dictation errors.

## 2022-03-18 DIAGNOSIS — Z48812 Encounter for surgical aftercare following surgery on the circulatory system: Secondary | ICD-10-CM | POA: Diagnosis not present

## 2022-03-18 DIAGNOSIS — Z951 Presence of aortocoronary bypass graft: Secondary | ICD-10-CM | POA: Diagnosis not present

## 2022-03-19 DIAGNOSIS — Z951 Presence of aortocoronary bypass graft: Secondary | ICD-10-CM | POA: Diagnosis not present

## 2022-03-19 DIAGNOSIS — Z48812 Encounter for surgical aftercare following surgery on the circulatory system: Secondary | ICD-10-CM | POA: Diagnosis not present

## 2022-03-20 ENCOUNTER — Ambulatory Visit: Payer: BC Managed Care – PPO | Admitting: Internal Medicine

## 2022-03-20 ENCOUNTER — Encounter: Payer: Self-pay | Admitting: Internal Medicine

## 2022-03-20 VITALS — BP 130/72 | HR 74 | Ht 67.0 in | Wt 315.2 lb

## 2022-03-20 DIAGNOSIS — E1169 Type 2 diabetes mellitus with other specified complication: Secondary | ICD-10-CM | POA: Diagnosis not present

## 2022-03-20 DIAGNOSIS — Z6841 Body Mass Index (BMI) 40.0 and over, adult: Secondary | ICD-10-CM

## 2022-03-20 DIAGNOSIS — E1159 Type 2 diabetes mellitus with other circulatory complications: Secondary | ICD-10-CM

## 2022-03-20 DIAGNOSIS — Z951 Presence of aortocoronary bypass graft: Secondary | ICD-10-CM | POA: Diagnosis not present

## 2022-03-20 DIAGNOSIS — I152 Hypertension secondary to endocrine disorders: Secondary | ICD-10-CM

## 2022-03-20 DIAGNOSIS — E118 Type 2 diabetes mellitus with unspecified complications: Secondary | ICD-10-CM

## 2022-03-20 DIAGNOSIS — E785 Hyperlipidemia, unspecified: Secondary | ICD-10-CM

## 2022-03-20 DIAGNOSIS — I6523 Occlusion and stenosis of bilateral carotid arteries: Secondary | ICD-10-CM

## 2022-03-20 MED ORDER — LOSARTAN POTASSIUM 25 MG PO TABS: 25.0000 mg | ORAL_TABLET | Freq: Every day | ORAL | 3 refills | Status: DC

## 2022-03-20 NOTE — Patient Instructions (Signed)
Medication Instructions:  Your physician has recommended you make the following change in your medication:  Stop taking lisinopril. Start taking losartan 25mg  at bedtime. Stop taking Plavix January 2024 *If you need a refill on your cardiac medications before your next appointment, please call your pharmacy*  Follow-Up: At [facility], you and your health needs are our priority.  As part of our continuing mission to provide you with exceptional heart care, we have created designated Provider Care Teams.  These Care Teams include your primary Cardiologist (physician) and Advanced Practice Providers (APPs -  Physician Assistants and Nurse Practitioners) who all work together to provide you with the care you need, when you need it.   Your next appointment:   9 month(s)  The format for your next appointment:   In Person  Provider:   [facility], MD     Other Instruction You have been referred to [facility] for weight loss management.   Important Information About Sugar

## 2022-03-23 DIAGNOSIS — Z951 Presence of aortocoronary bypass graft: Secondary | ICD-10-CM | POA: Diagnosis not present

## 2022-03-23 DIAGNOSIS — Z48812 Encounter for surgical aftercare following surgery on the circulatory system: Secondary | ICD-10-CM | POA: Diagnosis not present

## 2022-03-25 DIAGNOSIS — Z951 Presence of aortocoronary bypass graft: Secondary | ICD-10-CM | POA: Diagnosis not present

## 2022-03-25 DIAGNOSIS — Z48812 Encounter for surgical aftercare following surgery on the circulatory system: Secondary | ICD-10-CM | POA: Diagnosis not present

## 2022-03-30 DIAGNOSIS — Z951 Presence of aortocoronary bypass graft: Secondary | ICD-10-CM | POA: Diagnosis not present

## 2022-03-30 DIAGNOSIS — Z48812 Encounter for surgical aftercare following surgery on the circulatory system: Secondary | ICD-10-CM | POA: Diagnosis not present

## 2022-03-31 ENCOUNTER — Encounter (HOSPITAL_BASED_OUTPATIENT_CLINIC_OR_DEPARTMENT_OTHER): Payer: Self-pay | Admitting: Internal Medicine

## 2022-04-01 DIAGNOSIS — Z951 Presence of aortocoronary bypass graft: Secondary | ICD-10-CM | POA: Diagnosis not present

## 2022-04-01 DIAGNOSIS — Z48812 Encounter for surgical aftercare following surgery on the circulatory system: Secondary | ICD-10-CM | POA: Diagnosis not present

## 2022-04-02 DIAGNOSIS — Z951 Presence of aortocoronary bypass graft: Secondary | ICD-10-CM | POA: Diagnosis not present

## 2022-04-02 DIAGNOSIS — Z48812 Encounter for surgical aftercare following surgery on the circulatory system: Secondary | ICD-10-CM | POA: Diagnosis not present

## 2022-04-03 ENCOUNTER — Encounter: Payer: Self-pay | Admitting: Internal Medicine

## 2022-04-06 DIAGNOSIS — E538 Deficiency of other specified B group vitamins: Secondary | ICD-10-CM | POA: Diagnosis not present

## 2022-04-06 DIAGNOSIS — Z48812 Encounter for surgical aftercare following surgery on the circulatory system: Secondary | ICD-10-CM | POA: Diagnosis not present

## 2022-04-06 DIAGNOSIS — Z951 Presence of aortocoronary bypass graft: Secondary | ICD-10-CM | POA: Diagnosis not present

## 2022-04-06 DIAGNOSIS — D508 Other iron deficiency anemias: Secondary | ICD-10-CM | POA: Diagnosis not present

## 2022-04-06 DIAGNOSIS — E119 Type 2 diabetes mellitus without complications: Secondary | ICD-10-CM | POA: Diagnosis not present

## 2022-04-06 DIAGNOSIS — Z23 Encounter for immunization: Secondary | ICD-10-CM | POA: Diagnosis not present

## 2022-04-06 MED ORDER — LOSARTAN POTASSIUM 25 MG PO TABS: 12.5000 mg | ORAL_TABLET | Freq: Every day | ORAL | 3 refills | Status: DC

## 2022-04-08 DIAGNOSIS — Z951 Presence of aortocoronary bypass graft: Secondary | ICD-10-CM | POA: Diagnosis not present

## 2022-04-08 DIAGNOSIS — Z48812 Encounter for surgical aftercare following surgery on the circulatory system: Secondary | ICD-10-CM | POA: Diagnosis not present

## 2022-04-09 DIAGNOSIS — Z48812 Encounter for surgical aftercare following surgery on the circulatory system: Secondary | ICD-10-CM | POA: Diagnosis not present

## 2022-04-09 DIAGNOSIS — Z951 Presence of aortocoronary bypass graft: Secondary | ICD-10-CM | POA: Diagnosis not present

## 2022-04-10 ENCOUNTER — Ambulatory Visit (INDEPENDENT_AMBULATORY_CARE_PROVIDER_SITE_OTHER): Payer: BC Managed Care – PPO | Admitting: Pharmacist

## 2022-04-10 DIAGNOSIS — Z6841 Body Mass Index (BMI) 40.0 and over, adult: Secondary | ICD-10-CM

## 2022-04-10 DIAGNOSIS — E119 Type 2 diabetes mellitus without complications: Secondary | ICD-10-CM | POA: Diagnosis not present

## 2022-04-10 NOTE — Progress Notes (Unsigned)
Patient ID: FEMALE IAFRATE                 DOB: 1965-10-01                    MRN: [id number]     HPI: Penny Huerta is a 56 y.o. female patient referred to pharmacy clinic by Dr Lynnette Caffey for weight loss management. PMH is significant for CAD s/p CABG x4, DM2, HTN, HLD, bilateral carotid artery stenosis, and obesity. A1c most recently 5.9% on Jardiance 10mg  daily and metformin 500mg  BID.  Pt presents today for follow up with her husband. Has researched GLP1RAs and has a variety of questions regarding benefits, side effect profile, and access. Focuses on eating a heart healthy diet. Has previously lost 60 lbs by cutting back on her carb intake dramatically. Prefers to focus on dietary changes rather than taking medications to help with weight loss. Interested in seeing healthy weight and wellness clinic.  Exercise: cardiac rehab - 45 mins 3 days a week using ellipitical and walking  Family History: Mother with stroke, HTN, HLD, and DM. Father with stroke.  Social History: Former tobacco use, quit in 1999. Former alcohol and drug use.  Labs:  Wt Readings from Last 1 Encounters:  03/20/22 (!) 315 lb 3.2 oz (143 kg)    BP Readings from Last 1 Encounters:  03/20/22 130/72   Pulse Readings from Last 1 Encounters:  03/20/22 74       Component Value Date/Time   CHOL 314 (H) 12/06/2021 0742   CHOL 340 (H) 03/05/2021 1033   TRIG 312 (H) 12/06/2021 0742   HDL 25 (L) 12/06/2021 0742   HDL 26 (L) 03/05/2021 1033   CHOLHDL 12.6 12/06/2021 0742   VLDL 62 (H) 12/06/2021 0742   LDLCALC 227 (H) 12/06/2021 0742   LDLCALC 249 (H) 03/05/2021 1033   LDLDIRECT 214.4 03/10/2013 1109    Past Medical History:  Diagnosis Date   Allergy    Anxiety    Atrial fibrillation (HCC)    Diabetes mellitus without complication (HCC)    Heart murmur    ARRHYTHMIA   History of drug use    Hyperlipidemia    Hypertension     Current Outpatient Medications on File Prior to Visit  Medication Sig Dispense  Refill   acetaminophen (TYLENOL) 500 MG tablet Take 500 mg by mouth every 6 (six) hours as needed for mild pain.     aspirin EC 81 MG tablet Take 81 mg by mouth daily. Swallow whole.     clopidogrel (PLAVIX) 75 MG tablet Take 1 tablet by mouth daily.     empagliflozin (JARDIANCE) 10 MG TABS tablet Take 1 tablet (10 mg total) by mouth daily before breakfast. 90 tablet 3   Evolocumab (REPATHA SURECLICK) 140 MG/ML SOAJ Inject 1 Dose into the skin every 14 (fourteen) days. 6 mL 3   ezetimibe (ZETIA) 10 MG tablet Take 1 tablet (10 mg total) by mouth daily. 90 tablet 3   fluticasone (FLONASE) 50 MCG/ACT nasal spray Place 2 sprays into both nostrils in the morning.     Homeopathic Products (MUSCLE THERAPY/ARNICA) GEL Apply 1 application. topically 3 (three) times daily as needed (knee pain.).     losartan (COZAAR) 25 MG tablet Take 0.5 tablets (12.5 mg total) by mouth at bedtime. 90 tablet 3   metFORMIN (GLUCOPHAGE-XR) 500 MG 24 hr tablet Take 500 mg by mouth 2 (two) times daily.     metoprolol tartrate (LOPRESSOR)  25 MG tablet Take 37.5 mg by mouth 2 (two) times daily.     pantoprazole (PROTONIX) 40 MG tablet Take 1 tablet (40 mg total) by mouth daily. (Patient taking differently: Take 40 mg by mouth every evening.) 90 tablet 3   No current facility-administered medications on file prior to visit.    Allergies  Allergen Reactions   Alprazolam Other (See Comments)    Caused mood changes   Citalopram Hydrobromide Other (See Comments)    Mood changes   Lisinopril Cough   Nitrofurantoin Monohyd Macro Nausea And Vomiting   Statins Other (See Comments)    MUSCLE LOSS - lipitor, zocor   Sulfa Drugs Cross Reactors Nausea And Vomiting     Assessment/Plan:  1. Weight management - Discussed GLP1RAs in depth today including benefits, side effects, and access. Her insurance does cover Ozempic. She prefers to focus on lifestyle changes rather than use medication to help with weight loss. She plans to  decrease carb intake to help with weight loss which has worked well in the past for her. Would also like a referral to healthy weight and wellness clinic, this has been placed. F/u with pt as needed.  Caroleen Stoermer E. Arlee Santosuosso, PharmD, BCACP, CPP [facility] [address] [place], [place] [address] Phone: [phone]; Fax: [phone] 04/10/2022 3:13 PM

## 2022-04-10 NOTE — Patient Instructions (Signed)
GLP-1 Receptor Agonist Counseling Points This medication reduces your appetite and may make you feel fuller longer.  Stop eating when your body tells you that you are full. This will likely happen sooner than you are used to. Store your medication in the fridge until you are ready to use it. Inject your medication in the fatty tissue of your lower abdominal area (2 inches away from belly button) or upper outer thigh. Rotate injection sites. Each pen will last you about 1 month (the first month it will last a few weeks longer). Use a different needle with each weekly injection. Common side effects include: nausea, diarrhea/constipation, and heartburn, and are more likely to occur if you overeat.  Dosing schedule: - Month 1: Inject Ozempic 0.25mg  subcutaneously once weekly for 4 weeks - Month 2: Inject Ozempic 0.5mg  subcutaneously once weekly for 6 weeks - Month 3: Inject Ozempic 1mg  subcutaneously once weekly for 4 weeks - Month 4: Inject Ozempic 2mg  subcutaneously once weekly  Tips for living a healthier life     Building a Healthy and Balanced Diet Make most of your meal vegetables and fruits -  of your plate. Aim for color and variety, and remember that potatoes don't count as vegetables on the Healthy Eating Plate because of their negative impact on blood sugar.  Go for whole grains -  of your plate. Whole and intact grains--whole wheat, barley, wheat berries, quinoa, oats, brown rice, and foods made with them, such as whole wheat pasta--have a milder effect on blood sugar and insulin than white bread, white rice, and other refined grains.  Protein power -  of your plate. Fish, poultry, beans, and nuts are all healthy, versatile protein sources--they can be mixed into salads, and pair well with vegetables on a plate. Limit red meat, and avoid processed meats such as bacon and sausage.  Healthy plant oils - in moderation. Choose healthy vegetable oils like olive, canola, soy, corn,  sunflower, peanut, and others, and avoid partially hydrogenated oils, which contain unhealthy trans fats. Remember that low-fat does not mean "healthy."  Drink water, coffee, or tea. Skip sugary drinks, limit milk and dairy products to one to two servings per day, and limit juice to a small glass per day.  Stay active. The red figure running across the Healthy Eating Plate's placemat is a reminder that staying active is also important in weight control.  The main message of the Healthy Eating Plate is to focus on diet quality:  The type of carbohydrate in the diet is more important than the amount of carbohydrate in the diet, because some sources of carbohydrate--like vegetables (other than potatoes), fruits, whole grains, and beans--are healthier than others. The Healthy Eating Plate also advises consumers to avoid sugary beverages, a major source of calories--usually with little nutritional value--in the American diet. The Healthy Eating Plate encourages consumers to use healthy oils, and it does not set a maximum on the percentage of calories people should get each day from healthy sources of fat. In this way, the Healthy Eating Plate recommends the opposite of the low-fat message promoted for decades by the USDA.   SUGAR  Sugar is a huge problem in the modern day diet. Sugar is a big contributor to heart disease, diabetes, high triglyceride levels, fatty liver disease and obesity. Sugar is hidden in almost all packaged foods/beverages. Added sugar is extra sugar that is added beyond what is naturally found and has no nutritional benefit for your body. The American Heart  Association recommends limiting added sugars to no more than 25g for women and 36 grams for men per day. There are many names for sugar including maltose, sucrose (names ending in "ose"), high fructose corn syrup, molasses, cane sugar, corn sweetener, raw sugar,  syrup, honey or fruit juice concentrate.   One of the best ways to limit your added sugars is to stop drinking sweetened beverages such as soda, sweet tea, and fruit juice.  There is 65g of added sugars in one 20oz bottle of Coke! That is equal to 7.5 donuts.   Pay attention and read all nutrition facts labels. Below is an examples of a nutrition facts label. The #1 is showing you the total sugars where the # 2 is showing you the added sugars. This one serving has almost the max amount of added sugars per day!     20 oz Soda 65g Sugar = 7.5 Glazed Donuts  16oz Energy  Drink 54g Sugar = 6.5 Glazed Donuts  Large Sweet  Tea 38g Sugar = 4 Glazed Donuts  20oz Sports  Drink 34g Sugar = 3.5 Glazed Donuts  8oz Chocolate Milk 24g Sugar =2.5 Glazed Donuts  8oz Orange  Juice 21g Sugar = 2 Glazed Donuts  1 Juice Box 14g Sugar = 1.5 Glazed Donuts  16oz Water= NO SUGAR!!  EXERCISE  Exercise is good. We've all heard that. In an ideal world, we would all have time and resources to get plenty of it. When you are active, your heart pumps more efficiently and you will feel better.  Multiple studies show that even walking regularly has benefits that include living a longer life. The American Heart Association recommends 150 minutes per week of exercise (30 minutes per day most days of the week). You can do this in any increment you wish. Nine or more 10-minute walks count. So does an hour-long exercise class. Break the time apart into what will work in your life. Some of the best things you can do include walking briskly, jogging, cycling or swimming laps. Not everyone is ready to "exercise." Sometimes we need to start with just getting active. Here are some easy ways to be more active throughout the day:  Take the stairs instead of the elevator  Go for a 10-15 minute walk during your lunch break (find a friend to make it more enjoyable)  When shopping, park at the back of the parking lot  If  you take public transportation, get off one stop early and walk the extra distance  Pace around while making phone calls  Check with your doctor if you aren't sure what your limitations may be. Always remember to drink plenty of water when doing any type of exercise. Don't feel like a failure if you're not getting the 90-150 minutes per week. If you started by being a couch potato, then just a 10-minute walk each day is a huge improvement. Start with little victories and work your way up.   HEALTHY EATING TIPS  When looking to improve your eating habits, whether to lose weight, lower blood pressure or just be healthier, it helps to know what a serving size is.   Grains 1 slice of bread,  bagel,  cup pasta or rice  Vegetables 1 cup fresh or raw vegetables,  cup cooked or canned Fruits 1 piece of medium sized fruit,  cup canned,   Meats/Proteins  cup dried       1 oz meat, 1 egg,  cup cooked beans, nuts  or seeds  Dairy        Fats Individual yogurt container, 1 cup (8oz)    1 teaspoon margarine/butter or vegetable  milk or milk alternative, 1 slice of cheese          oil; 1 tablespoon mayonnaise or salad dressing                  Plan ahead: make a menu of the meals for a week then create a grocery list to go with that menu. Consider meals that easily stretch into a night of leftovers, such as stews or casseroles. Or consider making two of your favorite meal and put one in the freezer for another night. Try a night or two each week that is "meatless" or "no cook" such as salads. When you get home from the grocery store wash and prepare your vegetables and fruits. Then when you need them they are ready to go.   Tips for going to the grocery store:  Buy store or generic brands  Check the weekly ad from your store on-line or in their in-store flyer  Look at the unit price on the shelf tag to compare/contrast the costs of different items  Buy fruits/vegetables in season  Carrots, bananas  and apples are low-cost, naturally healthy items  If meats or frozen vegetables are on sale, buy some extras and put in your freezer  Limit buying prepared or "ready to eat" items, even if they are pre-made salads or fruit snacks  Do not shop when you're hungry  Foods at eye level tend to be more expensive. Look on the high and low shelves for deals.  Consider shopping at the farmer's market for fresh foods in season.  Avoid the cookie and chip aisles (these are expensive, high in calories and low in nutritional value). Shop on the outside of the grocery store.  Healthy food preparations:  If you can't get lean hamburger, be sure to drain the fat when cooking  Steam, saut (in olive oil), grill or bake foods  Experiment with different seasonings to avoid adding salt to your foods. Kosher salt, sea salt and Himalayan salt are all still salt and should be avoided. Try seasoning food with onion, garlic, thyme, rosemary, basil ect. Onion powder or garlic powder is ok. Avoid if it says salt (ie garlic salt).

## 2022-04-13 DIAGNOSIS — Z951 Presence of aortocoronary bypass graft: Secondary | ICD-10-CM | POA: Diagnosis not present

## 2022-04-13 DIAGNOSIS — Z48812 Encounter for surgical aftercare following surgery on the circulatory system: Secondary | ICD-10-CM | POA: Diagnosis not present

## 2022-04-15 DIAGNOSIS — Z48812 Encounter for surgical aftercare following surgery on the circulatory system: Secondary | ICD-10-CM | POA: Diagnosis not present

## 2022-04-15 DIAGNOSIS — Z951 Presence of aortocoronary bypass graft: Secondary | ICD-10-CM | POA: Diagnosis not present

## 2022-04-16 DIAGNOSIS — Z951 Presence of aortocoronary bypass graft: Secondary | ICD-10-CM | POA: Diagnosis not present

## 2022-04-16 DIAGNOSIS — Z48812 Encounter for surgical aftercare following surgery on the circulatory system: Secondary | ICD-10-CM | POA: Diagnosis not present

## 2022-04-20 DIAGNOSIS — G43809 Other migraine, not intractable, without status migrainosus: Secondary | ICD-10-CM | POA: Diagnosis not present

## 2022-04-20 DIAGNOSIS — N390 Urinary tract infection, site not specified: Secondary | ICD-10-CM | POA: Diagnosis not present

## 2022-04-20 DIAGNOSIS — Z951 Presence of aortocoronary bypass graft: Secondary | ICD-10-CM | POA: Diagnosis not present

## 2022-04-20 DIAGNOSIS — R5383 Other fatigue: Secondary | ICD-10-CM | POA: Diagnosis not present

## 2022-04-20 DIAGNOSIS — R350 Frequency of micturition: Secondary | ICD-10-CM | POA: Diagnosis not present

## 2022-04-27 ENCOUNTER — Ambulatory Visit: Payer: BC Managed Care – PPO | Admitting: Neurology

## 2022-04-27 DIAGNOSIS — Z48812 Encounter for surgical aftercare following surgery on the circulatory system: Secondary | ICD-10-CM | POA: Diagnosis not present

## 2022-04-27 DIAGNOSIS — Z951 Presence of aortocoronary bypass graft: Secondary | ICD-10-CM | POA: Diagnosis not present

## 2022-04-28 DIAGNOSIS — E7801 Familial hypercholesterolemia: Secondary | ICD-10-CM | POA: Diagnosis not present

## 2022-04-29 DIAGNOSIS — Z951 Presence of aortocoronary bypass graft: Secondary | ICD-10-CM | POA: Diagnosis not present

## 2022-04-29 DIAGNOSIS — Z48812 Encounter for surgical aftercare following surgery on the circulatory system: Secondary | ICD-10-CM | POA: Diagnosis not present

## 2022-04-29 LAB — NMR, LIPOPROFILE
Cholesterol, Total: 234 mg/dL — ABNORMAL HIGH (ref 100–199)
HDL Particle Number: 19.6 umol/L — ABNORMAL LOW (ref >=30.5)
HDL-C: 27 mg/dL — ABNORMAL LOW (ref >39)
LDL Particle Number: 2526 nmol/L — ABNORMAL HIGH (ref <1000)
LDL Size: 19.8 nm — ABNORMAL LOW (ref >20.5)
LDL-C (NIH Calc): 148 mg/dL — ABNORMAL HIGH (ref 0–99)
LP-IR Score: 76 — ABNORMAL HIGH (ref <=45)
Small LDL Particle Number: 1950 nmol/L — ABNORMAL HIGH (ref <=527)
Triglycerides: 318 mg/dL — ABNORMAL HIGH (ref 0–149)

## 2022-04-29 LAB — LIPOPROTEIN A (LPA): Lipoprotein (a): 15.3 nmol/L (ref <75.0)

## 2022-04-30 DIAGNOSIS — Z48812 Encounter for surgical aftercare following surgery on the circulatory system: Secondary | ICD-10-CM | POA: Diagnosis not present

## 2022-04-30 DIAGNOSIS — Z951 Presence of aortocoronary bypass graft: Secondary | ICD-10-CM | POA: Diagnosis not present

## 2022-05-01 ENCOUNTER — Encounter (HOSPITAL_BASED_OUTPATIENT_CLINIC_OR_DEPARTMENT_OTHER): Payer: Self-pay | Admitting: Internal Medicine

## 2022-05-01 ENCOUNTER — Ambulatory Visit (HOSPITAL_BASED_OUTPATIENT_CLINIC_OR_DEPARTMENT_OTHER): Payer: BC Managed Care – PPO | Admitting: Internal Medicine

## 2022-05-01 VITALS — BP 138/80 | HR 93 | Ht 67.0 in | Wt 325.0 lb

## 2022-05-01 DIAGNOSIS — E7801 Familial hypercholesterolemia: Secondary | ICD-10-CM | POA: Diagnosis not present

## 2022-05-01 DIAGNOSIS — I2510 Atherosclerotic heart disease of native coronary artery without angina pectoris: Secondary | ICD-10-CM | POA: Diagnosis not present

## 2022-05-01 DIAGNOSIS — Z951 Presence of aortocoronary bypass graft: Secondary | ICD-10-CM | POA: Diagnosis not present

## 2022-05-01 DIAGNOSIS — E1169 Type 2 diabetes mellitus with other specified complication: Secondary | ICD-10-CM

## 2022-05-01 DIAGNOSIS — E785 Hyperlipidemia, unspecified: Secondary | ICD-10-CM

## 2022-05-01 DIAGNOSIS — T466X5A Adverse effect of antihyperlipidemic and antiarteriosclerotic drugs, initial encounter: Secondary | ICD-10-CM

## 2022-05-01 DIAGNOSIS — M791 Myalgia, unspecified site: Secondary | ICD-10-CM

## 2022-05-01 NOTE — Patient Instructions (Signed)
Medication Instructions:  START nexletol 180mg  daily -- please contact our office to let know if this is tolerated  -- samples provided - 3 boxes   *If you need a refill on your cardiac medications before your next appointment, please call your pharmacy*   Lab Work: FASTING lab work to check cholesterol in 3-4 months   If you have labs (blood work) drawn today and your tests are completely normal, you will receive your results only by: MyChart Message (if you have MyChart) OR A paper copy in the mail If you have any lab test that is abnormal or we need to change your treatment, we will call you to review the results.   Follow-Up: At [facility], you and your health needs are our priority.  As part of our continuing mission to provide you with exceptional heart care, we have created designated Provider Care Teams.  These Care Teams include your primary Cardiologist (physician) and Advanced Practice Providers (APPs -  Physician Assistants and Nurse Practitioners) who all work together to provide you with the care you need, when you need it.  We recommend signing up for the patient portal called "MyChart".  Sign up information is provided on this After Visit Summary.  MyChart is used to connect with patients for Virtual Visits (Telemedicine).  Patients are able to view lab/test results, encounter notes, upcoming appointments, etc.  Non-urgent messages can be sent to your provider as well.   To learn more about what you can do with MyChart, go to [facility].    Your next appointment:   3-4 months with [email or web address]

## 2022-05-03 NOTE — Progress Notes (Signed)
LIPID CLINIC CONSULT NOTE  Chief Complaint:  Follow-up dyslipidemia  Primary Care Physician: Katherina Mires, MD  Primary Cardiologist:  Early Osmond, MD  HPI:  Penny Huerta is a 56 y.o. female who is being seen today for the evaluation of dyslipidemia at the request of Katherina Mires, MD. This is a pleasant female that I met initially in the hospital in March at the time when she presented with chest pain.  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  Percutaneous intervention versus medical therapy was recommended.  This subsequently was reviewed by Dr. Ali Lowe and she ultimately sought out another opinion at [facility].  She then underwent coronary artery bypass grafting x 4 with Dr. Clementeen Graham about a week ago, this included SVG to the PDA, SVG to OM1, SVG to first diagonal and LIMA to LAD.  She is tolerated this well although is in some discomfort.  Prior to surgery, I had advised her to start on ezetimibe because of statin intolerance.  She has had a very high cholesterol and probable familial hyperlipidemia.  LDL cholesterol has been consistently in the mid 200s more recently with LDL 249 off treatment.  So far she seems to be tolerating ezetimibe but could not take the statins due to significant myalgias.  She is a very good candidate for PCSK9 inhibitor.  05/01/2022  Penny Huerta returns today for follow-up of dyslipidemia.  She is currently on Repatha 140 mg q2w and zetia 10 mg daily.  She has also recently been referred to [facility] weight [facility].  She has had further improvement in her cholesterol, however her numbers remain elevated.  LDL particle #[id number].  LDL-C of 148, HDL-C of 27, triglycerides 318 and total cholesterol 234.  Previously her LDL-C was 227 in March 2023 therefore this represents significant improvement however she still remains well above  targets.  PMHx:  Past Medical History:  Diagnosis Date   Allergy    Anxiety    Atrial fibrillation ([place])    Diabetes mellitus without complication ([place])    Heart murmur    ARRHYTHMIA   History of drug use    Hyperlipidemia    Hypertension     Past Surgical History:  Procedure Laterality Date   HYSTEROSCOPY W/ ENDOMETRIAL ABLATION  2011   LEFT HEART CATH AND CORONARY ANGIOGRAPHY N/A 12/05/2021   Procedure: LEFT HEART CATH AND CORONARY ANGIOGRAPHY;  Surgeon: Early Osmond, MD;  Location: [place] [facility];  Service: Cardiovascular;  Laterality: N/A;    FAMHx:  Family History  Problem Relation Age of Onset   Stroke Mother    Hyperlipidemia Mother    Transient ischemic attack Mother    Hypertension Mother    Diabetes Mother    Stroke Father    Transient ischemic attack Father    Sudden death Maternal Uncle    Heart disease Maternal Grandmother 26   Hyperlipidemia Maternal Grandmother    Hyperlipidemia Maternal Grandfather    Heart disease Maternal Grandfather    Ovarian cancer Paternal Grandmother    Stroke Paternal Grandfather    Hypertension Paternal Grandfather    Diabetes Paternal Grandfather     SOCHx:   reports that she quit smoking about 24 years ago. Her smoking use included cigarettes. She has never used smokeless tobacco. She reports that she does not currently use alcohol. She  reports that she does not currently use drugs.  ALLERGIES:  Allergies  Allergen Reactions   Alprazolam Other (See Comments)    Caused mood changes   Citalopram Hydrobromide Other (See Comments)    Mood changes   Lisinopril Cough   Nitrofurantoin Monohyd Macro Nausea And Vomiting   Statins Other (See Comments)    MUSCLE LOSS - lipitor, zocor   Sulfa Drugs Cross Reactors Nausea And Vomiting    ROS: Pertinent items noted in HPI and remainder of comprehensive ROS otherwise negative.  HOME MEDS: Current Outpatient Medications on File Prior to Visit  Medication Sig Dispense  Refill   acetaminophen (TYLENOL) 500 MG tablet Take 500 mg by mouth every 6 (six) hours as needed for mild pain.     aspirin EC 81 MG tablet Take 81 mg by mouth daily. Swallow whole.     clopidogrel (PLAVIX) 75 MG tablet Take 1 tablet by mouth daily.     empagliflozin (JARDIANCE) 10 MG TABS tablet Take 1 tablet (10 mg total) by mouth daily before breakfast. 90 tablet 3   Evolocumab (REPATHA SURECLICK) 591 MG/ML SOAJ Inject 1 Dose into the skin every 14 (fourteen) days. 6 mL 3   ezetimibe (ZETIA) 10 MG tablet Take 1 tablet (10 mg total) by mouth daily. 90 tablet 3   fluticasone (FLONASE) 50 MCG/ACT nasal spray Place 2 sprays into both nostrils in the morning.     Homeopathic Products (MUSCLE THERAPY/ARNICA) GEL Apply 1 application. topically 3 (three) times daily as needed (knee pain.).     losartan (COZAAR) 25 MG tablet Take 0.5 tablets (12.5 mg total) by mouth at bedtime. 90 tablet 3   metFORMIN (GLUCOPHAGE-XR) 500 MG 24 hr tablet Take 500 mg by mouth 2 (two) times daily.     metoprolol tartrate (LOPRESSOR) 25 MG tablet Take 37.5 mg by mouth 2 (two) times daily.     pantoprazole (PROTONIX) 40 MG tablet Take 1 tablet (40 mg total) by mouth daily. (Patient taking differently: Take 40 mg by mouth every evening.) 90 tablet 3   No current facility-administered medications on file prior to visit.    LABS/IMAGING: No results found for this or any previous visit (from the past 48 hour(s)). No results found.  LIPID PANEL:    Component Value Date/Time   CHOL 314 (H) 12/06/2021 0742   CHOL 340 (H) 03/05/2021 1033   TRIG 312 (H) 12/06/2021 0742   HDL 25 (L) 12/06/2021 0742   HDL 26 (L) 03/05/2021 1033   CHOLHDL 12.6 12/06/2021 0742   VLDL 62 (H) 12/06/2021 0742   LDLCALC 227 (H) 12/06/2021 0742   LDLCALC 249 (H) 03/05/2021 1033   LDLDIRECT 214.4 03/10/2013 1109    WEIGHTS: Wt Readings from Last 3 Encounters:  05/01/22 (!) 325 lb (147.4 kg)  04/10/22 (!) 317 lb (143.8 kg)  03/20/22 (!)  315 lb 3.2 oz (143 kg)    VITALS: BP 138/80   Pulse 93   Ht 5' 7"  (1.702 m)   Wt (!) 325 lb (147.4 kg)   LMP 01/06/2012   SpO2 96%   BMI 50.90 kg/m   EXAM: Deferred  EKG: Deferred  ASSESSMENT: CAD status post CABG x4 (LIMA to LAD, SVG to PDA, SVG to OM1 and SVG to diagonal) -01/2022, [facility]-Dr. Clementeen Graham Probable familial hyperlipidemia -goal LDL less than 55. Statin intolerant-myalgias Bilateral carotid artery stenosis Morbid obesity Type 2 diabetes Hypertension  PLAN: 1.   Penny Huerta has had good improvement with Repatha in addition to ezetimibe.  While LDLs come down significantly, further lowering would be ideal.  We talked about additional options to help with this.  I would recommend adding Nexletol to her Zetia which if tolerated could be combined.  This could give [place] another 20 to 25% reduction in her lipids.  We will plan repeat labs in about 3 to 4 months.  With me at that time.  Pixie Casino, MD, [facility], [place] Director of the Advanced Lipid Disorders &  Cardiovascular Risk Reduction Clinic Diplomate of the American Board of Clinical Lipidology Attending Cardiologist  Direct Dial: [phone]  Fax: [phone]  Website:  [email or web address] 05/03/2022, 9:42 PM

## 2022-05-04 DIAGNOSIS — Z951 Presence of aortocoronary bypass graft: Secondary | ICD-10-CM | POA: Diagnosis not present

## 2022-05-04 DIAGNOSIS — Z48812 Encounter for surgical aftercare following surgery on the circulatory system: Secondary | ICD-10-CM | POA: Diagnosis not present

## 2022-05-06 DIAGNOSIS — Z951 Presence of aortocoronary bypass graft: Secondary | ICD-10-CM | POA: Diagnosis not present

## 2022-05-06 DIAGNOSIS — Z48812 Encounter for surgical aftercare following surgery on the circulatory system: Secondary | ICD-10-CM | POA: Diagnosis not present

## 2022-05-07 DIAGNOSIS — Z48812 Encounter for surgical aftercare following surgery on the circulatory system: Secondary | ICD-10-CM | POA: Diagnosis not present

## 2022-05-07 DIAGNOSIS — Z951 Presence of aortocoronary bypass graft: Secondary | ICD-10-CM | POA: Diagnosis not present

## 2022-05-11 DIAGNOSIS — Z951 Presence of aortocoronary bypass graft: Secondary | ICD-10-CM | POA: Diagnosis not present

## 2022-05-11 DIAGNOSIS — Z48812 Encounter for surgical aftercare following surgery on the circulatory system: Secondary | ICD-10-CM | POA: Diagnosis not present

## 2022-05-12 DIAGNOSIS — R635 Abnormal weight gain: Secondary | ICD-10-CM | POA: Diagnosis not present

## 2022-05-13 DIAGNOSIS — Z48812 Encounter for surgical aftercare following surgery on the circulatory system: Secondary | ICD-10-CM | POA: Diagnosis not present

## 2022-05-13 DIAGNOSIS — Z951 Presence of aortocoronary bypass graft: Secondary | ICD-10-CM | POA: Diagnosis not present

## 2022-05-14 DIAGNOSIS — Z48812 Encounter for surgical aftercare following surgery on the circulatory system: Secondary | ICD-10-CM | POA: Diagnosis not present

## 2022-05-14 DIAGNOSIS — Z951 Presence of aortocoronary bypass graft: Secondary | ICD-10-CM | POA: Diagnosis not present

## 2022-05-19 DIAGNOSIS — R350 Frequency of micturition: Secondary | ICD-10-CM | POA: Diagnosis not present

## 2022-05-19 DIAGNOSIS — E119 Type 2 diabetes mellitus without complications: Secondary | ICD-10-CM | POA: Diagnosis not present

## 2022-05-19 DIAGNOSIS — R208 Other disturbances of skin sensation: Secondary | ICD-10-CM | POA: Diagnosis not present

## 2022-05-21 DIAGNOSIS — Z48812 Encounter for surgical aftercare following surgery on the circulatory system: Secondary | ICD-10-CM | POA: Diagnosis not present

## 2022-05-21 DIAGNOSIS — Z951 Presence of aortocoronary bypass graft: Secondary | ICD-10-CM | POA: Diagnosis not present

## 2022-05-25 DIAGNOSIS — Z48812 Encounter for surgical aftercare following surgery on the circulatory system: Secondary | ICD-10-CM | POA: Diagnosis not present

## 2022-05-25 DIAGNOSIS — Z951 Presence of aortocoronary bypass graft: Secondary | ICD-10-CM | POA: Diagnosis not present

## 2022-05-27 DIAGNOSIS — Z951 Presence of aortocoronary bypass graft: Secondary | ICD-10-CM | POA: Diagnosis not present

## 2022-05-27 DIAGNOSIS — Z48812 Encounter for surgical aftercare following surgery on the circulatory system: Secondary | ICD-10-CM | POA: Diagnosis not present

## 2022-05-28 DIAGNOSIS — Z951 Presence of aortocoronary bypass graft: Secondary | ICD-10-CM | POA: Diagnosis not present

## 2022-05-28 DIAGNOSIS — Z48812 Encounter for surgical aftercare following surgery on the circulatory system: Secondary | ICD-10-CM | POA: Diagnosis not present

## 2022-06-02 ENCOUNTER — Ambulatory Visit: Payer: BC Managed Care – PPO | Admitting: Neurology

## 2022-06-24 ENCOUNTER — Encounter (INDEPENDENT_AMBULATORY_CARE_PROVIDER_SITE_OTHER): Payer: Self-pay | Admitting: Family Medicine

## 2022-06-24 ENCOUNTER — Ambulatory Visit (INDEPENDENT_AMBULATORY_CARE_PROVIDER_SITE_OTHER): Payer: Self-pay | Admitting: Family Medicine

## 2022-06-24 VITALS — BP 122/79 | HR 77 | Temp 97.8°F | Ht 65.0 in | Wt 320.0 lb

## 2022-06-24 DIAGNOSIS — E1169 Type 2 diabetes mellitus with other specified complication: Secondary | ICD-10-CM

## 2022-06-24 DIAGNOSIS — E1165 Type 2 diabetes mellitus with hyperglycemia: Secondary | ICD-10-CM

## 2022-06-24 DIAGNOSIS — E669 Obesity, unspecified: Secondary | ICD-10-CM

## 2022-06-24 DIAGNOSIS — Z0289 Encounter for other administrative examinations: Secondary | ICD-10-CM

## 2022-06-24 DIAGNOSIS — E785 Hyperlipidemia, unspecified: Secondary | ICD-10-CM

## 2022-06-24 DIAGNOSIS — Z6841 Body Mass Index (BMI) 40.0 and over, adult: Secondary | ICD-10-CM

## 2022-06-24 DIAGNOSIS — Z7984 Long term (current) use of oral hypoglycemic drugs: Secondary | ICD-10-CM

## 2022-06-24 DIAGNOSIS — Z951 Presence of aortocoronary bypass graft: Secondary | ICD-10-CM

## 2022-07-01 ENCOUNTER — Encounter (INDEPENDENT_AMBULATORY_CARE_PROVIDER_SITE_OTHER): Payer: Self-pay | Admitting: Family Medicine

## 2022-07-01 ENCOUNTER — Ambulatory Visit (INDEPENDENT_AMBULATORY_CARE_PROVIDER_SITE_OTHER): Payer: BC Managed Care – PPO | Admitting: Family Medicine

## 2022-07-01 VITALS — BP 127/70 | HR 82 | Temp 97.9°F | Ht 65.0 in | Wt 322.0 lb

## 2022-07-01 DIAGNOSIS — Z6841 Body Mass Index (BMI) 40.0 and over, adult: Secondary | ICD-10-CM

## 2022-07-01 DIAGNOSIS — D508 Other iron deficiency anemias: Secondary | ICD-10-CM | POA: Diagnosis not present

## 2022-07-01 DIAGNOSIS — E1165 Type 2 diabetes mellitus with hyperglycemia: Secondary | ICD-10-CM

## 2022-07-01 DIAGNOSIS — F32A Depression, unspecified: Secondary | ICD-10-CM | POA: Diagnosis not present

## 2022-07-01 DIAGNOSIS — E66813 Obesity, class 3: Secondary | ICD-10-CM

## 2022-07-01 DIAGNOSIS — R0602 Shortness of breath: Secondary | ICD-10-CM | POA: Diagnosis not present

## 2022-07-01 DIAGNOSIS — I152 Hypertension secondary to endocrine disorders: Secondary | ICD-10-CM | POA: Diagnosis not present

## 2022-07-01 DIAGNOSIS — E538 Deficiency of other specified B group vitamins: Secondary | ICD-10-CM

## 2022-07-01 DIAGNOSIS — Z7984 Long term (current) use of oral hypoglycemic drugs: Secondary | ICD-10-CM

## 2022-07-01 DIAGNOSIS — E1159 Type 2 diabetes mellitus with other circulatory complications: Secondary | ICD-10-CM | POA: Diagnosis not present

## 2022-07-01 DIAGNOSIS — E7801 Familial hypercholesterolemia: Secondary | ICD-10-CM | POA: Diagnosis not present

## 2022-07-01 DIAGNOSIS — R5383 Other fatigue: Secondary | ICD-10-CM | POA: Diagnosis not present

## 2022-07-01 DIAGNOSIS — E78019 Familial hypercholesterolemia, unspecified: Secondary | ICD-10-CM

## 2022-07-01 DIAGNOSIS — F419 Anxiety disorder, unspecified: Secondary | ICD-10-CM | POA: Diagnosis not present

## 2022-07-01 MED ORDER — SERTRALINE HCL 25 MG PO TABS: 25.0000 mg | ORAL_TABLET | Freq: Every day | ORAL | 0 refills | Status: DC

## 2022-07-03 LAB — T4, FREE: Free T4: 1.09 ng/dL (ref 0.82–1.77)

## 2022-07-03 LAB — CBC WITH DIFFERENTIAL/PLATELET
Basophils Absolute: 0.1 10*3/uL (ref 0.0–0.2)
Basos: 1 %
EOS (ABSOLUTE): 0.1 10*3/uL (ref 0.0–0.4)
Eos: 1 %
Hemoglobin: 13 g/dL (ref 11.1–15.9)
Immature Grans (Abs): 0 10*3/uL (ref 0.0–0.1)
Immature Granulocytes: 0 %
Lymphocytes Absolute: 2.8 10*3/uL (ref 0.7–3.1)
Lymphs: 32 %
MCH: 25.4 pg — ABNORMAL LOW (ref 26.6–33.0)
MCHC: 31.5 g/dL (ref 31.5–35.7)
MCV: 81 fL (ref 79–97)
Monocytes Absolute: 0.6 10*3/uL (ref 0.1–0.9)
Monocytes: 7 %
Neutrophils Absolute: 5.1 10*3/uL (ref 1.4–7.0)
Neutrophils: 59 %
Platelets: 304 10*3/uL (ref 150–450)
RBC: 5.11 x10E6/uL (ref 3.77–5.28)
RDW: 19.6 % — ABNORMAL HIGH (ref 11.7–15.4)
WBC: 8.6 10*3/uL (ref 3.4–10.8)

## 2022-07-03 LAB — LIPID PANEL WITH LDL/HDL RATIO
Cholesterol, Total: 236 mg/dL — ABNORMAL HIGH (ref 100–199)
HDL: 33 mg/dL — ABNORMAL LOW (ref >39)
LDL Chol Calc (NIH): 154 mg/dL — ABNORMAL HIGH (ref 0–99)
LDL/HDL Ratio: 4.7 ratio — ABNORMAL HIGH (ref 0.0–3.2)
Triglycerides: 262 mg/dL — ABNORMAL HIGH (ref 0–149)
VLDL Cholesterol Cal: 49 mg/dL — ABNORMAL HIGH (ref 5–40)

## 2022-07-03 LAB — COMPREHENSIVE METABOLIC PANEL
ALT: 23 IU/L (ref 0–32)
AST: 20 IU/L (ref 0–40)
Albumin/Globulin Ratio: 1.8 (ref 1.2–2.2)
Albumin: 4.7 g/dL (ref 3.8–4.9)
Alkaline Phosphatase: 85 IU/L (ref 44–121)
BUN/Creatinine Ratio: 23 (ref 9–23)
BUN: 13 mg/dL (ref 6–24)
Bilirubin Total: 0.3 mg/dL (ref 0.0–1.2)
CO2: 23 mmol/L (ref 20–29)
Calcium: 9.7 mg/dL (ref 8.7–10.2)
Chloride: 101 mmol/L (ref 96–106)
Creatinine, Ser: 0.57 mg/dL (ref 0.57–1.00)
Globulin, Total: 2.6 g/dL (ref 1.5–4.5)
Glucose: 125 mg/dL — ABNORMAL HIGH (ref 70–99)
Potassium: 4.7 mmol/L (ref 3.5–5.2)
Sodium: 140 mmol/L (ref 134–144)
Total Protein: 7.3 g/dL (ref 6.0–8.5)
eGFR: 107 mL/min/{1.73_m2} (ref >59)

## 2022-07-03 LAB — ANEMIA PANEL
Ferritin: 50 ng/mL (ref 15–150)
Folate, Hemolysate: 447 ng/mL
Folate, RBC: 1082 ng/mL (ref >498)
Hematocrit: 41.3 % (ref 34.0–46.6)
Iron Saturation: 17 % (ref 15–55)
Iron: 60 ug/dL (ref 27–159)
Retic Ct Pct: 1.7 % (ref 0.6–2.6)
Total Iron Binding Capacity: 347 ug/dL (ref 250–450)
UIBC: 287 ug/dL (ref 131–425)
Vitamin B-12: 329 pg/mL (ref 232–1245)

## 2022-07-03 LAB — T3: T3, Total: 145 ng/dL (ref 71–180)

## 2022-07-03 LAB — VITAMIN D 25 HYDROXY (VIT D DEFICIENCY, FRACTURES): Vit D, 25-Hydroxy: 17.6 ng/mL — ABNORMAL LOW (ref 30.0–100.0)

## 2022-07-03 LAB — TSH: TSH: 1.28 u[IU]/mL (ref 0.450–4.500)

## 2022-07-03 LAB — FOLATE: Folate: 17.1 ng/mL (ref >3.0)

## 2022-07-03 LAB — HEMOGLOBIN A1C
Est. average glucose Bld gHb Est-mCnc: 131 mg/dL
Hgb A1c MFr Bld: 6.2 % — ABNORMAL HIGH (ref 4.8–5.6)

## 2022-07-03 LAB — INSULIN, RANDOM: INSULIN: 107 u[IU]/mL — ABNORMAL HIGH (ref 2.6–24.9)

## 2022-07-07 DIAGNOSIS — Z1231 Encounter for screening mammogram for malignant neoplasm of breast: Secondary | ICD-10-CM | POA: Diagnosis not present

## 2022-07-07 DIAGNOSIS — E559 Vitamin D deficiency, unspecified: Secondary | ICD-10-CM | POA: Diagnosis not present

## 2022-07-07 DIAGNOSIS — E1169 Type 2 diabetes mellitus with other specified complication: Secondary | ICD-10-CM | POA: Diagnosis not present

## 2022-07-07 DIAGNOSIS — E785 Hyperlipidemia, unspecified: Secondary | ICD-10-CM | POA: Diagnosis not present

## 2022-07-07 DIAGNOSIS — Z23 Encounter for immunization: Secondary | ICD-10-CM | POA: Diagnosis not present

## 2022-07-07 DIAGNOSIS — E119 Type 2 diabetes mellitus without complications: Secondary | ICD-10-CM | POA: Diagnosis not present

## 2022-07-07 DIAGNOSIS — E7849 Other hyperlipidemia: Secondary | ICD-10-CM | POA: Diagnosis not present

## 2022-07-07 DIAGNOSIS — E538 Deficiency of other specified B group vitamins: Secondary | ICD-10-CM | POA: Diagnosis not present

## 2022-07-09 NOTE — Progress Notes (Signed)
Chief Complaint:   OBESITY Penny Huerta (MR# [id number]) is a 56 y.o. female who presents for evaluation and treatment of obesity and related comorbidities. Current BMI is Body mass index is 53.58 kg/m. Penny Huerta has been struggling with her weight for many years and has been unsuccessful in either losing weight, maintaining weight loss, or reaching her healthy weight goal.  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 Penny Huerta lives with husband Penny Huerta. 2 strips of bacon and 2 eggs and 2 cups of coffee (2tbsp cream, non-sugar sweetener) (satisfied). 2 pm--Burger from [facility] without bread, cup of chilli and water (satisfied). Snack is handful of peanuts. ^ pm--hamburgers or salad from [place], handful of Cheetos.  Penny Huerta is currently in the action stage of change and ready to dedicate time achieving and maintaining a healthier weight. Penny Huerta is interested in becoming our patient and working on intensive lifestyle modifications including (but not limited to) diet and exercise for weight loss.  Penny Huerta's habits were reviewed today and are as follows: Her family eats meals together, she thinks her family will eat healthier with her, she struggles with family and or coworkers weight loss sabotage, her desired weight loss is 177 lbs, she has been heavy most of her life, she started gaining weight age [age], her heaviest weight ever was 327 pounds, she has significant food cravings issues, she snacks frequently in the evenings, she frequently makes poor food choices, she frequently eats larger portions than normal, and she struggles with emotional eating.  Depression Screen [facility] Food and Mood (modified PHQ-9) score was 23.     07/01/2022    7:58 AM  Depression screen PHQ 2/9  Decreased Interest 3  Down, Depressed, Hopeless 3  PHQ - 2 Score 6  Altered sleeping 3  Tired, decreased energy 3  Change in appetite 3  Feeling bad or failure about yourself  3  Trouble concentrating 3  Moving slowly or fidgety/restless 2   Suicidal thoughts 3  PHQ-9 Score 26  Difficult doing work/chores Somewhat difficult   Subjective:   1. Other fatigue Penny Huerta admits to daytime somnolence and admits to waking up still tired. Patient has a history of symptoms of daytime fatigue and morning fatigue. Penny Huerta generally gets 5 hours of sleep per night, and states that she has difficulty falling asleep. Snoring is present. Apneic episodes are not present. Epworth Sleepiness Score is 10.    2. SOBOE (shortness of breath on exertion) Penny Huerta notes increasing shortness of breath with exercising and seems to be worsening over time with weight gain. She notes getting out of breath sooner with activity than she used to. This has not gotten worse recently. Penny Huerta denies shortness of breath at rest or orthopnea.   3. Type 2 diabetes mellitus with hyperglycemia, without long-term current use of insulin (HCC) Penny Huerta is taking Metformin. Her last A1c at 6.0, no Insulin on file.  4. B12 deficiency Penny Huerta's B12 of 173. She is currently taking multivitamins.  5. 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 She is on Ezetimibe. S/P CABG.  6. Other iron deficiency anemia Penny Huerta reports she has been told she has this diagnose. She notes fatigue.  7. Hypertension associated with diabetes ([place]) Penny Huerta's blood pressure well controlled today. Denies chest pain, chest pressure and headache. She is on Losartan and Metoprolol.  8. Anxiety and depression Penny Huerta voices that in 2019 she struggled with significant depression and daily suicidal ideation. She is better now but still feels like she is trying to just get thru the  day.  Assessment/Plan:   1. Other fatigue We will obtain labs today. Penny Huerta does feel that her weight is causing her energy to be lower than it should be. Fatigue may be related to obesity, depression or many other causes. Labs will be ordered, and in the meanwhile, Penny Huerta will focus on self care including making  healthy food choices, increasing physical activity and focusing on stress reduction.   - Vitamin B12 - VITAMIN D 25 Hydroxy (Vit-D Deficiency, Fractures) - TSH - T4, free - T3  2. SOBOE (shortness of breath on exertion) Penny Huerta does feel that she gets out of breath more easily that she used to when she exercises. Penny Huerta's shortness of breath appears to be obesity related and exercise induced. She has agreed to work on weight loss and gradually increase exercise to treat her exercise induced shortness of breath. Will continue to monitor closely.   3. Type 2 diabetes mellitus with hyperglycemia, without long-term current use of insulin (HCC) We will obtain labs today.  - Hemoglobin A1c - Insulin, random  4. B12 deficiency We will obtain labs today.  5. Familial hypercholesterolemia We will obtain labs today. Patient to follow up with Cardiology to discuss further interventions.  - Lipid Panel With LDL/HDL Ratio  6. Other iron deficiency anemia We will obtain labs today.  - CBC with Differential/Platelet - Folate - Anemia panel  7. Hypertension associated with diabetes ([place]) We will obtain labs today.  - Comprehensive metabolic panel  8. Anxiety and depression Start Sertraline 25 mg by mouth daily for 1 month with 0 refills.  -Start sertraline (ZOLOFT) 25 MG tablet; Take 1 tablet (25 mg total) by mouth daily.  Dispense: 30 tablet; Refill: 0  9. Class 3 severe obesity with serious comorbidity and body mass index (BMI) of 50.0 to 59.9 in adult, unspecified obesity type (HCC) Penny Huerta is currently in the action stage of change and her goal is to continue with weight loss efforts. I recommend Penny Huerta begin the structured treatment plan as follows:  She has agreed to the Category 4 Plan.  Exercise goals: No exercise has been prescribed at this time.   Behavioral modification strategies: increasing lean protein intake, meal planning and cooking strategies, keeping healthy foods in the  home, and planning for success.  She was informed of the importance of frequent follow-up visits to maximize her success with intensive lifestyle modifications for her multiple health conditions. She was informed we would discuss her lab results at her next visit unless there is a critical issue that needs to be addressed sooner. Penny Huerta agreed to keep her next visit at the agreed upon time to discuss these results.  Objective:   Blood pressure 127/70, pulse 82, temperature 97.9 F (36.6 C), height 5\' 5"  (1.651 m), weight (!) 322 lb (146.1 kg), last menstrual period 01/06/2012, SpO2 97 %. Body mass index is 53.58 kg/m.  EKG: Normal sinus rhythm, rate 76 bpm.  Indirect Calorimeter completed today shows a VO2 of 405 ml and a REE of 2794.  Her calculated basal metabolic rate is [id number] thus her basal metabolic rate is better than expected.  General: Cooperative, alert, well developed, in no acute distress. HEENT: Conjunctivae and lids unremarkable. Cardiovascular: Regular rhythm.  Lungs: Normal work of breathing. Neurologic: No focal deficits.   Lab Results  Component Value Date   CREATININE 0.57 07/01/2022   BUN 13 07/01/2022   NA 140 07/01/2022   K 4.7 07/01/2022   CL 101 07/01/2022   CO2 23  07/01/2022   Lab Results  Component Value Date   ALT 23 07/01/2022   AST 20 07/01/2022   ALKPHOS 85 07/01/2022   BILITOT 0.3 07/01/2022   Lab Results  Component Value Date   HGBA1C 6.2 (H) 07/01/2022   HGBA1C 6.0 (H) 12/06/2021   HGBA1C 5.8 (H) 03/05/2021   HGBA1C 6.4 (H) 11/25/2020   HGBA1C 7.6 (H) 07/26/2020   Lab Results  Component Value Date   INSULIN 107.0 (H) 07/01/2022   Lab Results  Component Value Date   TSH 1.280 07/01/2022   Lab Results  Component Value Date   CHOL 236 (H) 07/01/2022   HDL 33 (L) 07/01/2022   LDLCALC 154 (H) 07/01/2022   LDLDIRECT 214.4 03/10/2013   TRIG 262 (H) 07/01/2022   CHOLHDL 12.6 12/06/2021   Lab Results  Component Value Date   WBC 8.6  07/01/2022   HGB 13.0 07/01/2022   HCT 41.3 07/01/2022   MCV 81 07/01/2022   PLT 304 07/01/2022   Lab Results  Component Value Date   IRON 60 07/01/2022   TIBC 347 07/01/2022   FERRITIN 50 07/01/2022   Attestation Statements:   Reviewed by clinician on day of visit: allergies, medications, problem list, medical history, surgical history, family history, social history, and previous encounter notes.  Time spent on visit including pre-visit chart review and post-visit charting and care was 50 minutes.   I, Elnora Morrison, RMA am acting as transcriptionist for Coralie Common, MD.  This is the patient's first visit at [facility]. The patient's NEW PATIENT PACKET was reviewed at length. Included in the packet: current and past health history, medications, allergies, ROS, gynecologic history (women only), surgical history, family history, social history, weight history, weight loss surgery history (for those that have had weight loss surgery), nutritional evaluation, mood and food questionnaire, PHQ9, Epworth questionnaire, sleep habits questionnaire, patient life and health improvement goals questionnaire. These will all be scanned into the patient's chart under media.   During the visit, I independently reviewed the patient's EKG, bioimpedance scale results, and indirect calorimeter results. I used this information to tailor a meal plan for the patient that will help her to lose weight and will improve her obesity-related conditions going forward. I performed a medically necessary appropriate examination and/or evaluation. I discussed the assessment and treatment plan with the patient. The patient was provided an opportunity to ask questions and all were answered. The patient agreed with the plan and demonstrated an understanding of the instructions. Labs were ordered at this visit and will be reviewed at the next visit unless more critical results need to be addressed  immediately. Clinical information was updated and documented in the EMR.    I have reviewed the above documentation for accuracy and completeness, and I agree with the above. - Coralie Common, MD

## 2022-07-09 NOTE — Progress Notes (Signed)
Chief Complaint:    Today's visit was #: Information Session Today's weight: 320 lbs Today's date: 06/24/2022  Interim History: Armando found this clinic because her clinic supplies toner. Does not tolerate some carbs--bread, rice and does like pretzels or chips much.  Subjective:   1. 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 Clementeen Graham. Now cleared from that service line.  2. Type 2 diabetes mellitus with hyperglycemia, without long-term current use of insulin (HCC) Penny Huerta is taking Jardiance and Glucophage, previously not interested in GLP-1 injectable. Last A1c was 5.9.  3. Hyperlipidemia associated with type 2 diabetes mellitus (HCC) Penny Huerta is taking Zetia and Repatha. She carries the gene for hypercholesterolemia.  Assessment/Plan:   1. S/P CABG (coronary artery bypass graft) Follow up only as needed.  2. Type 2 diabetes mellitus with hyperglycemia, without long-term current use of insulin ([place]) Follow up on Insulin level at next appointment.  3. Hyperlipidemia associated with type 2 diabetes mellitus ([place]) Follow up on FLP at next appointment.  4. Class 3 severe obesity with serious comorbidity and body mass index (BMI) of 50.0 to 59.9 in adult, unspecified obesity type (HCC) Behavioral modification strategies: increasing lean protein intake, meal planning and cooking strategies, keeping healthy foods in the home, and planning for success.  Penny Huerta has agreed to follow-up with our clinic in ( patient will decide) weeks. She was informed of the importance of frequent follow-up visits to maximize her success with intensive lifestyle modifications for her multiple health conditions.   Objective:   Blood pressure 122/79, pulse 77, temperature 97.8 F (36.6 C), height 5\' 5"  (1.651 m), weight (!) 320 lb (145.2 kg), last menstrual period 01/06/2012, SpO2 97 %. Body mass index is 53.25 kg/m.  General: Cooperative, alert, well developed,  in no acute distress. HEENT: Conjunctivae and lids unremarkable. Cardiovascular: Regular rhythm.  Lungs: Normal work of breathing. Neurologic: No focal deficits.   Lab Results  Component Value Date   CREATININE 0.57 07/01/2022   BUN 13 07/01/2022   NA 140 07/01/2022   K 4.7 07/01/2022   CL 101 07/01/2022   CO2 23 07/01/2022   Lab Results  Component Value Date   ALT 23 07/01/2022   AST 20 07/01/2022   ALKPHOS 85 07/01/2022   BILITOT 0.3 07/01/2022   Lab Results  Component Value Date   HGBA1C 6.2 (H) 07/01/2022   HGBA1C 6.0 (H) 12/06/2021   HGBA1C 5.8 (H) 03/05/2021   HGBA1C 6.4 (H) 11/25/2020   HGBA1C 7.6 (H) 07/26/2020   Lab Results  Component Value Date   INSULIN 107.0 (H) 07/01/2022   Lab Results  Component Value Date   TSH 1.280 07/01/2022   Lab Results  Component Value Date   CHOL 236 (H) 07/01/2022   HDL 33 (L) 07/01/2022   LDLCALC 154 (H) 07/01/2022   LDLDIRECT 214.4 03/10/2013   TRIG 262 (H) 07/01/2022   CHOLHDL 12.6 12/06/2021   Lab Results  Component Value Date   VD25OH 17.6 (L) 07/01/2022   Lab Results  Component Value Date   WBC 8.6 07/01/2022   HGB 13.0 07/01/2022   HCT 41.3 07/01/2022   MCV 81 07/01/2022   PLT 304 07/01/2022   Attestation Statements:   Reviewed by clinician on day of visit: allergies, medications, problem list, medical history, surgical history, family history, social history, and previous encounter notes.  I, Elnora Morrison, RMA am acting as transcriptionist for Coralie Common, MD.  I have reviewed the  above documentation for accuracy and completeness, and I agree with the above. - Coralie Common, MD

## 2022-07-21 ENCOUNTER — Ambulatory Visit (INDEPENDENT_AMBULATORY_CARE_PROVIDER_SITE_OTHER): Payer: BC Managed Care – PPO | Admitting: Family Medicine

## 2022-07-21 ENCOUNTER — Encounter (INDEPENDENT_AMBULATORY_CARE_PROVIDER_SITE_OTHER): Payer: Self-pay | Admitting: Family Medicine

## 2022-07-21 VITALS — BP 125/75 | HR 78 | Temp 97.9°F | Ht 65.0 in | Wt 318.0 lb

## 2022-07-21 DIAGNOSIS — E7801 Familial hypercholesterolemia: Secondary | ICD-10-CM | POA: Diagnosis not present

## 2022-07-21 DIAGNOSIS — E1165 Type 2 diabetes mellitus with hyperglycemia: Secondary | ICD-10-CM | POA: Diagnosis not present

## 2022-07-21 DIAGNOSIS — E559 Vitamin D deficiency, unspecified: Secondary | ICD-10-CM | POA: Diagnosis not present

## 2022-07-21 DIAGNOSIS — F419 Anxiety disorder, unspecified: Secondary | ICD-10-CM

## 2022-07-21 DIAGNOSIS — E669 Obesity, unspecified: Secondary | ICD-10-CM

## 2022-07-21 DIAGNOSIS — Z7984 Long term (current) use of oral hypoglycemic drugs: Secondary | ICD-10-CM

## 2022-07-21 DIAGNOSIS — Z6841 Body Mass Index (BMI) 40.0 and over, adult: Secondary | ICD-10-CM

## 2022-07-21 DIAGNOSIS — F32A Depression, unspecified: Secondary | ICD-10-CM

## 2022-07-21 MED ORDER — SERTRALINE HCL 25 MG PO TABS: 25.0000 mg | ORAL_TABLET | Freq: Every day | ORAL | 0 refills | Status: DC

## 2022-07-22 ENCOUNTER — Telehealth: Payer: Self-pay | Admitting: Pharmacist Clinician (PhC)/ Clinical Pharmacy Specialist

## 2022-07-22 DIAGNOSIS — N3001 Acute cystitis with hematuria: Secondary | ICD-10-CM | POA: Diagnosis not present

## 2022-07-22 DIAGNOSIS — R350 Frequency of micturition: Secondary | ICD-10-CM | POA: Diagnosis not present

## 2022-07-22 NOTE — Telephone Encounter (Signed)
Received request from MD at [facility] that patient had questions about cholesterol meds and LDL lowering.  Called patient, she noted that her MD at [facility] had multiple questions about her most recent lipid labs and whether the Repatha was working.  Patient LDL dropped from 249 to 156.    Had long discussion with patient.  She notes that grandmother died at 87 and pt has currently outlived almost all relatives on maternal side because of hyperlipidemia.  She thought the 100 point drop was good.  Assured her that any drop in LDL is good, and even though not at goal, 156 is definitely better than 200's.  She is scheduled to see Dr. Rennis Golden later this month, and explained that there are still a few med options available to her.  I'm sure he will review them at this time.  Patient thanked me for the information.

## 2022-07-28 NOTE — Progress Notes (Signed)
Chief Complaint:   OBESITY Penny Huerta is here to discuss her progress with her obesity treatment plan along with follow-up of her obesity related diagnoses. Penny Huerta is on the Category 4 Plan and states she is following her eating plan approximately 70% of the time. Penny Huerta states she is exercising 0 minutes 0 times per week.  Today's visit was #: 3 Starting weight: 322 lbs  Starting date: 07/01/2022 Today's weight: 318 lbs Today's date: 07/21/2022 Total lbs lost to date: 4 lbs Total lbs lost since last in-office visit: 4  Interim History: Penny Huerta really enjoyed the structure of the breakfast and lunch. She voices dinner has been particularly hard. Picking up dinner occasionally but also tried fish sticks , pork chops, or food mixed. No plans for Thanksgiving.  Subjective:   1. Familial hypercholesterolemia Penny Huerta is on Repatha (pt not taking Zetia). Sees [facility] for cardiology (next appt 08/13/22) no change in cholesterol since starting.  2. Vitamin D deficiency Penny Huerta is on Vit D, Vit D level 17.6. She is on D2 50k IU/wk for 12 week.  3. Type 2 diabetes mellitus with hyperglycemia, without long-term current use of insulin (HCC) Penny Huerta's A1c 6.2, Insulin 107. She is only checking blood sugar daily and its always 430.  4. Anxiety and depression Penny Huerta is on Sertraline 25 mg. Denies suicidal ideas, and homicidal ideas. Symptoms much better controlled on Sertraline.  Assessment/Plan:   1. Familial hypercholesterolemia Will repeat labs in 3-4 months. Follow up with [facility] at next appointment to discuss further managed. Follow up with [facility] Pharmacist for further management.  2. Vitamin D deficiency Repeat labs in 3 months.  3. Type 2 diabetes mellitus with hyperglycemia, without long-term current use of insulin (HCC) Continue Metformin. Retest labs in 3 months.  4. Anxiety and depression We will refill Sertraline 25 mg by mouth daily for 1 month with 0 refills.  -Refill sertraline  (ZOLOFT) 25 MG tablet; Take 1 tablet (25 mg total) by mouth daily.  Dispense: 90 tablet; Refill: 0  5. Obesity with current BMI of 53.1 Penny Huerta is currently in the action stage of change. As such, her goal is to continue with weight loss efforts. She has agreed to the Category 4 Plan and keeping a food journal and adhering to recommended goals of 550-700 calories and 50+ grams of protein at supper.   Exercise goals: No exercise has been prescribed at this time.  Behavioral modification strategies: increasing lean protein intake, meal planning and cooking strategies, keeping healthy foods in the home, and planning for success.  Penny Huerta has agreed to follow-up with our clinic in 3 weeks. She was informed of the importance of frequent follow-up visits to maximize her success with intensive lifestyle modifications for her multiple health conditions.   Objective:   Blood pressure 125/75, pulse 78, temperature 97.9 F (36.6 C), height 5\' 5"  (1.651 m), weight (!) 318 lb (144.2 kg), last menstrual period 01/06/2012, SpO2 94 %. Body mass index is 52.92 kg/m.  General: Cooperative, alert, well developed, in no acute distress. HEENT: Conjunctivae and lids unremarkable. Cardiovascular: Regular rhythm.  Lungs: Normal work of breathing. Neurologic: No focal deficits.   Lab Results  Component Value Date   CREATININE 0.57 07/01/2022   BUN 13 07/01/2022   NA 140 07/01/2022   K 4.7 07/01/2022   CL 101 07/01/2022   CO2 23 07/01/2022   Lab Results  Component Value Date   ALT 23 07/01/2022   AST 20 07/01/2022   ALKPHOS 85 07/01/2022  BILITOT 0.3 07/01/2022   Lab Results  Component Value Date   HGBA1C 6.2 (H) 07/01/2022   HGBA1C 6.0 (H) 12/06/2021   HGBA1C 5.8 (H) 03/05/2021   HGBA1C 6.4 (H) 11/25/2020   HGBA1C 7.6 (H) 07/26/2020   Lab Results  Component Value Date   INSULIN 107.0 (H) 07/01/2022   Lab Results  Component Value Date   TSH 1.280 07/01/2022   Lab Results  Component Value  Date   CHOL 236 (H) 07/01/2022   HDL 33 (L) 07/01/2022   LDLCALC 154 (H) 07/01/2022   LDLDIRECT 214.4 03/10/2013   TRIG 262 (H) 07/01/2022   CHOLHDL 12.6 12/06/2021   Lab Results  Component Value Date   VD25OH 17.6 (L) 07/01/2022   Lab Results  Component Value Date   WBC 8.6 07/01/2022   HGB 13.0 07/01/2022   HCT 41.3 07/01/2022   MCV 81 07/01/2022   PLT 304 07/01/2022   Lab Results  Component Value Date   IRON 60 07/01/2022   TIBC 347 07/01/2022   FERRITIN 50 07/01/2022   Attestation Statements:   Reviewed by clinician on day of visit: allergies, medications, problem list, medical history, surgical history, family history, social history, and previous encounter notes.  I, Fortino Sic, RMA am acting as transcriptionist for Reuben Likes, MD.  I have reviewed the above documentation for accuracy and completeness, and I agree with the above. - Reuben Likes, MD

## 2022-07-30 DIAGNOSIS — I6523 Occlusion and stenosis of bilateral carotid arteries: Secondary | ICD-10-CM | POA: Diagnosis not present

## 2022-07-30 DIAGNOSIS — I6602 Occlusion and stenosis of left middle cerebral artery: Secondary | ICD-10-CM | POA: Diagnosis not present

## 2022-08-11 DIAGNOSIS — U071 COVID-19: Secondary | ICD-10-CM | POA: Diagnosis not present

## 2022-08-12 ENCOUNTER — Encounter (INDEPENDENT_AMBULATORY_CARE_PROVIDER_SITE_OTHER): Payer: Self-pay | Admitting: Family Medicine

## 2022-08-12 ENCOUNTER — Telehealth (INDEPENDENT_AMBULATORY_CARE_PROVIDER_SITE_OTHER): Payer: BC Managed Care – PPO | Admitting: Family Medicine

## 2022-08-12 ENCOUNTER — Ambulatory Visit (INDEPENDENT_AMBULATORY_CARE_PROVIDER_SITE_OTHER): Payer: BC Managed Care – PPO | Admitting: Family Medicine

## 2022-08-12 DIAGNOSIS — F32A Depression, unspecified: Secondary | ICD-10-CM | POA: Diagnosis not present

## 2022-08-12 DIAGNOSIS — F419 Anxiety disorder, unspecified: Secondary | ICD-10-CM | POA: Diagnosis not present

## 2022-08-12 DIAGNOSIS — E7801 Familial hypercholesterolemia: Secondary | ICD-10-CM | POA: Diagnosis not present

## 2022-08-12 DIAGNOSIS — E669 Obesity, unspecified: Secondary | ICD-10-CM

## 2022-08-12 DIAGNOSIS — E1165 Type 2 diabetes mellitus with hyperglycemia: Secondary | ICD-10-CM | POA: Diagnosis not present

## 2022-08-12 DIAGNOSIS — Z6841 Body Mass Index (BMI) 40.0 and over, adult: Secondary | ICD-10-CM

## 2022-08-12 NOTE — Progress Notes (Signed)
TeleHealth Visit:  This visit was completed with telemedicine (audio/video) technology. Penny Huerta has verbally consented to this TeleHealth visit. The patient is located at home, the provider is located at home. The participants in this visit include the listed provider and patient. The visit was conducted today via MyChart video.  OBESITY Penny Huerta is here to discuss her progress with her obesity treatment plan along with follow-up of her obesity related diagnoses.   Today's visit was # 4 Starting weight: 322 lbs  Starting date: 07/01/2022 Weight at last in office visit: 318 lbs on 07/21/22 Total weight loss: 4 lbs at last in office visit on 07/21/22. Today's reported weight: No weight reported.  Nutrition Plan: Category 4 Plan and keeping a food journal and adhering to recommended goals of 550-700 calories and 50+ grams of protein at supper. .   Current exercise:  none  Interim History: Penny Huerta has not adhered to plan recently because she has had COVID.  She has no taste or smell and appetite has been low.  Before that she had a UTI which kept her from adhering to plan.  She is trying to make good choices.   Her husband is also having health issues-some kind of neurological problem akin to Parkinson's. When she is following the plan she is able to get in the prescribed protein.  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  Denies excessive hunger.  She drinks only water.  Assessment/Plan:  1. Type II Diabetes HgbA1c is at goal. Last A1c was 6.2 on 07/01/2022. Medication(s): Metformin XR 500 mg twice daily with meals.  Lab Results  Component Value Date   HGBA1C 6.2 (H) 07/01/2022   HGBA1C 6.0 (H) 12/06/2021   HGBA1C 5.8 (H) 03/05/2021   Lab Results  Component Value Date   MICROALBUR 30 03/05/2021   LDLCALC 154 (H) 07/01/2022   CREATININE 0.57 07/01/2022    Plan: New metformin XR 500 mg twice daily with meals. Work on plan adherence.   2.  Familial hypercholesterolemia Sees  Dr. Rennis Golden.  On Repatha and Zetia.  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  Also has carotid stenosis and history of TIA.  Reports she and her mom are the only people "left" in her family.  Last LDL was 154, HDL 33, triglycerides 262. Lab Results  Component Value Date   CHOL 236 (H) 07/01/2022   HDL 33 (L) 07/01/2022   LDLCALC 154 (H) 07/01/2022   LDLDIRECT 214.4 03/10/2013   TRIG 262 (H) 07/01/2022   CHOLHDL 12.6 12/06/2021   Plan: Follow-up with Dr. Rennis Golden as directed. Continue Zetia and Repatha as directed.  3.  Anxiety and depression Mood stable.  She reports she is not crying as often since starting  sertraline 25 mg daily.  Plan: Continue sertraline 25 mg daily.   4. Obesity: Current BMI 53 Penny Huerta is currently in the action stage of change. As such, her goal is to continue with weight loss efforts.  She has agreed to Category 4 Plan and keeping a food journal and adhering to recommended goals of 550-700 calories and 50+ grams of protein at supper. . .   Exercise goals: No exercise has been prescribed at this time.  Behavioral modification strategies: increasing lean protein intake, decreasing simple carbohydrates, meal planning and cooking strategies, and planning for success.  Penny Huerta has agreed to follow-up with our clinic in 3 weeks.   No orders of the defined types were placed in this encounter.   Medications Discontinued During This Encounter  Medication Reason  Multiple Vitamins-Minerals (WOMENS MULTIVITAMIN PO) Discontinued by provider     No orders of the defined types were placed in this encounter.     Objective:   VITALS: Per patient if applicable, see vitals. GENERAL: Alert and in no acute distress. CARDIOPULMONARY: No increased WOB. Speaking in clear sentences.  PSYCH: Pleasant and cooperative. Speech normal rate and rhythm. Affect is appropriate. Insight and judgement are appropriate. Attention is focused, linear, and appropriate.  NEURO: Oriented as arrived to appointment  on time with no prompting.   Lab Results  Component Value Date   CREATININE 0.57 07/01/2022   BUN 13 07/01/2022   NA 140 07/01/2022   K 4.7 07/01/2022   CL 101 07/01/2022   CO2 23 07/01/2022   Lab Results  Component Value Date   ALT 23 07/01/2022   AST 20 07/01/2022   ALKPHOS 85 07/01/2022   BILITOT 0.3 07/01/2022   Lab Results  Component Value Date   HGBA1C 6.2 (H) 07/01/2022   HGBA1C 6.0 (H) 12/06/2021   HGBA1C 5.8 (H) 03/05/2021   HGBA1C 6.4 (H) 11/25/2020   HGBA1C 7.6 (H) 07/26/2020   Lab Results  Component Value Date   INSULIN 107.0 (H) 07/01/2022   Lab Results  Component Value Date   TSH 1.280 07/01/2022   Lab Results  Component Value Date   CHOL 236 (H) 07/01/2022   HDL 33 (L) 07/01/2022   LDLCALC 154 (H) 07/01/2022   LDLDIRECT 214.4 03/10/2013   TRIG 262 (H) 07/01/2022   CHOLHDL 12.6 12/06/2021   Lab Results  Component Value Date   WBC 8.6 07/01/2022   HGB 13.0 07/01/2022   HCT 41.3 07/01/2022   MCV 81 07/01/2022   PLT 304 07/01/2022   Lab Results  Component Value Date   IRON 60 07/01/2022   TIBC 347 07/01/2022   FERRITIN 50 07/01/2022   Lab Results  Component Value Date   VD25OH 17.6 (L) 07/01/2022    Attestation Statements:   Reviewed by clinician on day of visit: allergies, medications, problem list, medical history, surgical history, family history, social history, and previous encounter notes.  Time spent on visit including the items listed below was 30 minutes.  -preparing to see the patient (e.g., review of tests, history, previous notes) -obtaining and/or reviewing separately obtained history -counseling and educating the patient/family/caregiver -documenting clinical information in the electronic or other health record

## 2022-08-13 ENCOUNTER — Ambulatory Visit: Payer: BC Managed Care – PPO | Admitting: Internal Medicine

## 2022-09-02 ENCOUNTER — Encounter (INDEPENDENT_AMBULATORY_CARE_PROVIDER_SITE_OTHER): Payer: Self-pay | Admitting: Family Medicine

## 2022-09-02 ENCOUNTER — Ambulatory Visit (INDEPENDENT_AMBULATORY_CARE_PROVIDER_SITE_OTHER): Payer: BC Managed Care – PPO | Admitting: Family Medicine

## 2022-09-02 VITALS — BP 138/78 | HR 72 | Temp 98.3°F | Ht 65.0 in | Wt 328.0 lb

## 2022-09-02 DIAGNOSIS — E1159 Type 2 diabetes mellitus with other circulatory complications: Secondary | ICD-10-CM | POA: Diagnosis not present

## 2022-09-02 DIAGNOSIS — I152 Hypertension secondary to endocrine disorders: Secondary | ICD-10-CM

## 2022-09-02 DIAGNOSIS — E669 Obesity, unspecified: Secondary | ICD-10-CM | POA: Diagnosis not present

## 2022-09-02 DIAGNOSIS — Z7984 Long term (current) use of oral hypoglycemic drugs: Secondary | ICD-10-CM

## 2022-09-02 DIAGNOSIS — Z6841 Body Mass Index (BMI) 40.0 and over, adult: Secondary | ICD-10-CM

## 2022-09-21 DIAGNOSIS — R92333 Mammographic heterogeneous density, bilateral breasts: Secondary | ICD-10-CM | POA: Diagnosis not present

## 2022-09-21 DIAGNOSIS — Z1231 Encounter for screening mammogram for malignant neoplasm of breast: Secondary | ICD-10-CM | POA: Diagnosis not present

## 2022-09-21 NOTE — Progress Notes (Signed)
Chief Complaint:   OBESITY Penny Huerta is here to discuss her progress with her obesity treatment plan along with follow-up of her obesity related diagnoses. Penny Huerta is on the Category 4 Plan and keeping a food journal and adhering to recommended goals of 550-700 calories and 50+grams of protein and states she is following her eating plan approximately 25% of the time. Penny Huerta states she is exercising 0 minutes 0 times per week.  Today's visit was #: 5 Starting weight: 322 lbs Starting date: 07/01/2022 Today's weight: 328 lbs Today's date: 09/02/2022 Total lbs lost to date: 0 lbs Total lbs lost since last in-office visit: 10  Interim History: Penny Huerta is leaving in 2 days for the [place] with her husband and staying until Tuesday.  Planning to go for a walk while she is away.  Subjective:   1. Hypertension associated with diabetes (HCC) Penny Huerta's blood pressure is well-controlled today.  Assessment/Plan:   1. Hypertension associated with diabetes (HCC) Continue with current medications without any changes in dose or medication.  2. Obesity with current BMI of 54.7 Penny Huerta is currently in the action stage of change. As such, her goal is to continue with weight loss efforts. She has agreed to the Category 4 Plan and keeping a food journal and adhering to recommended goals of 550-700 calories and 50+grams of protein supper.   Exercise goals: All adults should avoid inactivity. Some physical activity is better than none, and adults who participate in any amount of physical activity gain some health benefits.  Behavioral modification strategies: increasing lean protein intake, meal planning and cooking strategies, keeping healthy foods in the home, and planning for success.  Penny Huerta has agreed to follow-up with our clinic in 4 weeks. She was informed of the importance of frequent follow-up visits to maximize her success with intensive lifestyle modifications for her multiple health conditions.    Objective:   Blood pressure 138/78, pulse 72, temperature 98.3 F (36.8 C), height 5\' 5"  (1.651 m), weight (!) 328 lb (148.8 kg), last menstrual period 01/06/2012, SpO2 95 %. Body mass index is 54.58 kg/m.  General: Cooperative, alert, well developed, in no acute distress. HEENT: Conjunctivae and lids unremarkable. Cardiovascular: Regular rhythm.  Lungs: Normal work of breathing. Neurologic: No focal deficits.   Lab Results  Component Value Date   CREATININE 0.57 07/01/2022   BUN 13 07/01/2022   NA 140 07/01/2022   K 4.7 07/01/2022   CL 101 07/01/2022   CO2 23 07/01/2022   Lab Results  Component Value Date   ALT 23 07/01/2022   AST 20 07/01/2022   ALKPHOS 85 07/01/2022   BILITOT 0.3 07/01/2022   Lab Results  Component Value Date   HGBA1C 6.2 (H) 07/01/2022   HGBA1C 6.0 (H) 12/06/2021   HGBA1C 5.8 (H) 03/05/2021   HGBA1C 6.4 (H) 11/25/2020   HGBA1C 7.6 (H) 07/26/2020   Lab Results  Component Value Date   INSULIN 107.0 (H) 07/01/2022   Lab Results  Component Value Date   TSH 1.280 07/01/2022   Lab Results  Component Value Date   CHOL 236 (H) 07/01/2022   HDL 33 (L) 07/01/2022   LDLCALC 154 (H) 07/01/2022   LDLDIRECT 214.4 03/10/2013   TRIG 262 (H) 07/01/2022   CHOLHDL 12.6 12/06/2021   Lab Results  Component Value Date   VD25OH 17.6 (L) 07/01/2022   Lab Results  Component Value Date   WBC 8.6 07/01/2022   HGB 13.0 07/01/2022   HCT 41.3 07/01/2022  MCV 81 07/01/2022   PLT 304 07/01/2022   Lab Results  Component Value Date   IRON 60 07/01/2022   TIBC 347 07/01/2022   FERRITIN 50 07/01/2022   Attestation Statements:   Reviewed by clinician on day of visit: allergies, medications, problem list, medical history, surgical history, family history, social history, and previous encounter notes.  I, Elnora Morrison, RMA am acting as transcriptionist for Coralie Common, MD.  I have reviewed the above documentation for accuracy and completeness,  and I agree with the above. - Coralie Common, MD

## 2022-09-30 DIAGNOSIS — R921 Mammographic calcification found on diagnostic imaging of breast: Secondary | ICD-10-CM | POA: Diagnosis not present

## 2022-09-30 DIAGNOSIS — R92331 Mammographic heterogeneous density, right breast: Secondary | ICD-10-CM | POA: Diagnosis not present

## 2022-10-01 ENCOUNTER — Encounter (INDEPENDENT_AMBULATORY_CARE_PROVIDER_SITE_OTHER): Payer: Self-pay | Admitting: Family Medicine

## 2022-10-01 ENCOUNTER — Ambulatory Visit (INDEPENDENT_AMBULATORY_CARE_PROVIDER_SITE_OTHER): Payer: BC Managed Care – PPO | Admitting: Family Medicine

## 2022-10-01 VITALS — BP 135/82 | HR 87 | Temp 98.0°F | Ht 65.0 in | Wt 331.0 lb

## 2022-10-01 DIAGNOSIS — E1165 Type 2 diabetes mellitus with hyperglycemia: Secondary | ICD-10-CM | POA: Diagnosis not present

## 2022-10-01 DIAGNOSIS — Z6841 Body Mass Index (BMI) 40.0 and over, adult: Secondary | ICD-10-CM

## 2022-10-01 DIAGNOSIS — F419 Anxiety disorder, unspecified: Secondary | ICD-10-CM | POA: Diagnosis not present

## 2022-10-01 DIAGNOSIS — Z7984 Long term (current) use of oral hypoglycemic drugs: Secondary | ICD-10-CM

## 2022-10-01 DIAGNOSIS — E669 Obesity, unspecified: Secondary | ICD-10-CM

## 2022-10-01 DIAGNOSIS — F32A Depression, unspecified: Secondary | ICD-10-CM

## 2022-10-01 MED ORDER — SERTRALINE HCL 50 MG PO TABS: 50.0000 mg | ORAL_TABLET | Freq: Every day | ORAL | 0 refills | Status: AC

## 2022-10-07 DIAGNOSIS — I10 Essential (primary) hypertension: Secondary | ICD-10-CM | POA: Diagnosis not present

## 2022-10-07 DIAGNOSIS — E7849 Other hyperlipidemia: Secondary | ICD-10-CM | POA: Diagnosis not present

## 2022-10-07 DIAGNOSIS — E1169 Type 2 diabetes mellitus with other specified complication: Secondary | ICD-10-CM | POA: Diagnosis not present

## 2022-10-07 DIAGNOSIS — E785 Hyperlipidemia, unspecified: Secondary | ICD-10-CM | POA: Diagnosis not present

## 2022-10-13 NOTE — Progress Notes (Signed)
Chief Complaint:   OBESITY Penny Huerta is here to discuss her progress with her obesity treatment plan along with follow-up of her obesity related diagnoses. Penny Huerta is on the Category 4 Plan and keeping a food journal and adhering to recommended goals of 550-700 calories and 150+grams at supper protein and states she is following her eating plan approximately 20% of the time. Penny Huerta states she is walking more.  Today's visit was #: 6 Starting weight: 322 lbs Starting date: 07/01/2022 Today's weight: 331 lbs Today's date: 10/01/2022 Total lbs lost to date: 0 lbs Total lbs lost since last in-office visit: 0  Interim History: Penny Huerta underwent additional testing for breast cancer and was found to be cancer free.  Has started walking more.  Has been cooking more and is able to cook more.  Subjective:   1. Type 2 diabetes mellitus with hyperglycemia, without long-term current use of insulin (HCC) Penny Huerta is on metformin at all.  Last A1c 3 months ago.  2. Anxiety and depression Penny Huerta is on Sertraline 25 mg daily with some improvement in symptoms.  Assessment/Plan:   1. Type 2 diabetes mellitus with hyperglycemia, without long-term current use of insulin (HCC) Will repeat labs with PCP at upcoming physical.  2. Anxiety and depression Will refill sertraline 50 mg by mouth daily for 3 months with 0 refills.  -Refill sertraline (ZOLOFT) 50 MG tablet; Take 1 tablet (50 mg total) by mouth daily.  Dispense: 90 tablet; Refill: 0  3. Obesity with current BMI of 55.2 Penny Huerta is currently in the action stage of change. As such, her goal is to continue with weight loss efforts. She has agreed to the Category 4 Plan.   Exercise goals: All adults should avoid inactivity. Some physical activity is better than none, and adults who participate in any amount of physical activity gain some health benefits.  Behavioral modification strategies: increasing lean protein intake, meal planning and cooking  strategies, keeping healthy foods in the home, and planning for success.  Penny Huerta has agreed to follow-up with our clinic in 2 weeks. She was informed of the importance of frequent follow-up visits to maximize her success with intensive lifestyle modifications for her multiple health conditions.   Objective:   Blood pressure 135/82, pulse 87, temperature 98 F (36.7 C), height 5\' 5"  (1.651 m), last menstrual period 01/06/2012, SpO2 95 %. Body mass index is 54.58 kg/m.  General: Cooperative, alert, well developed, in no acute distress. HEENT: Conjunctivae and lids unremarkable. Cardiovascular: Regular rhythm.  Lungs: Normal work of breathing. Neurologic: No focal deficits.   Lab Results  Component Value Date   CREATININE 0.57 07/01/2022   BUN 13 07/01/2022   NA 140 07/01/2022   K 4.7 07/01/2022   CL 101 07/01/2022   CO2 23 07/01/2022   Lab Results  Component Value Date   ALT 23 07/01/2022   AST 20 07/01/2022   ALKPHOS 85 07/01/2022   BILITOT 0.3 07/01/2022   Lab Results  Component Value Date   HGBA1C 6.2 (H) 07/01/2022   HGBA1C 6.0 (H) 12/06/2021   HGBA1C 5.8 (H) 03/05/2021   HGBA1C 6.4 (H) 11/25/2020   HGBA1C 7.6 (H) 07/26/2020   Lab Results  Component Value Date   INSULIN 107.0 (H) 07/01/2022   Lab Results  Component Value Date   TSH 1.280 07/01/2022   Lab Results  Component Value Date   CHOL 236 (H) 07/01/2022   HDL 33 (L) 07/01/2022   LDLCALC 154 (H) 07/01/2022   LDLDIRECT  214.4 03/10/2013   TRIG 262 (H) 07/01/2022   CHOLHDL 12.6 12/06/2021   Lab Results  Component Value Date   VD25OH 17.6 (L) 07/01/2022   Lab Results  Component Value Date   WBC 8.6 07/01/2022   HGB 13.0 07/01/2022   HCT 41.3 07/01/2022   MCV 81 07/01/2022   PLT 304 07/01/2022   Lab Results  Component Value Date   IRON 60 07/01/2022   TIBC 347 07/01/2022   FERRITIN 50 07/01/2022   Attestation Statements:   Reviewed by clinician on day of visit: allergies, medications,  problem list, medical history, surgical history, family history, social history, and previous encounter notes.  I, Elnora Morrison, RMA am acting as transcriptionist for Coralie Common, MD.  I have reviewed the above documentation for accuracy and completeness, and I agree with the above. - Coralie Common, MD

## 2022-10-14 DIAGNOSIS — R52 Pain, unspecified: Secondary | ICD-10-CM | POA: Diagnosis not present

## 2022-10-19 ENCOUNTER — Encounter (INDEPENDENT_AMBULATORY_CARE_PROVIDER_SITE_OTHER): Payer: Self-pay | Admitting: Family Medicine

## 2022-10-19 ENCOUNTER — Ambulatory Visit (INDEPENDENT_AMBULATORY_CARE_PROVIDER_SITE_OTHER): Payer: BC Managed Care – PPO | Admitting: Family Medicine

## 2022-10-19 VITALS — BP 136/82 | HR 71 | Temp 98.5°F | Ht 65.0 in | Wt 330.0 lb

## 2022-10-19 DIAGNOSIS — Z6841 Body Mass Index (BMI) 40.0 and over, adult: Secondary | ICD-10-CM | POA: Diagnosis not present

## 2022-10-19 DIAGNOSIS — E669 Obesity, unspecified: Secondary | ICD-10-CM | POA: Diagnosis not present

## 2022-10-19 DIAGNOSIS — E1165 Type 2 diabetes mellitus with hyperglycemia: Secondary | ICD-10-CM | POA: Diagnosis not present

## 2022-10-19 DIAGNOSIS — Z7984 Long term (current) use of oral hypoglycemic drugs: Secondary | ICD-10-CM

## 2022-10-19 DIAGNOSIS — N390 Urinary tract infection, site not specified: Secondary | ICD-10-CM

## 2022-10-19 NOTE — Progress Notes (Deleted)
Patient had the stomach virus since last appointment.  She found out her husband does not have cancer and she voices that she does not have cancer as well.  Fortunately her husband did not get the stomach virus.  She is finally feeling normal after gastroenteritis.  Foodwise her husband is open to trying more different options which has opened up opportunities for her.  She feels more positive about meal options now.  No upcoming plans for travel or activities or events.  Likes the structure of Category 4 meal plan.  Realizes she has not always consistently weighed or measured protein.

## 2022-10-20 ENCOUNTER — Ambulatory Visit: Payer: BC Managed Care – PPO | Admitting: Neurology

## 2022-10-20 DIAGNOSIS — R350 Frequency of micturition: Secondary | ICD-10-CM | POA: Diagnosis not present

## 2022-10-20 DIAGNOSIS — N3001 Acute cystitis with hematuria: Secondary | ICD-10-CM | POA: Diagnosis not present

## 2022-10-20 DIAGNOSIS — I10 Essential (primary) hypertension: Secondary | ICD-10-CM | POA: Diagnosis not present

## 2022-11-04 NOTE — Progress Notes (Signed)
Chief Complaint:   OBESITY Penny Huerta is here to discuss her progress with her obesity treatment plan along with follow-up of her obesity related diagnoses. Penny Huerta is on the Category 4 Plan and states she is following her eating plan approximately 50% of the time. Penny Huerta states she is exercising 0 minutes 0 times per week.  Today's visit was #: 7 Starting weight: 322 lbs Starting date: 07/01/2022 Today's weight: 330 lbs Today's date: 10/19/2022 Total lbs lost to date: 0 lbs Total lbs lost since last in-office visit: 1  Interim History: Penny Huerta had the stomach virus since last appointment.  She found out her husband does not have cancer and she voices that she does not have cancer as well.  Fortunately her husband did not get the stomach virus.  She is finally feeling normal after gastroenteritis.  Foodwise her husband is open to trying more different options which has opened up opportunities for her.  She feels more positive about meal options now.  No upcoming plans for travel or activities or events.  Likes the structure of Category 4 meal plan.  Realizes she has not always consistently weighed or measured protein.  Subjective:   1. Type 2 diabetes mellitus with hyperglycemia, without long-term current use of insulin ([place]) Penny Huerta's last A1c at PCP was 6.3.  Slight increase from prior lab.  On metformin twice a day.  2. Urinary tract infection, recurrent This is Penny Huerta's third or more UTIs since August 2023.  Sites increased in incidence with Repatha.  Assessment/Plan:   1. Type 2 diabetes mellitus with hyperglycemia, without long-term current use of insulin (HCC) Will repeat labs in 3 months.  2. Urinary tract infection, recurrent Discussed starting pelvic floor physical therapy and referral to [facility].  Follow-up on decision at next appointment.  3. BMI 50.0-59.9, adult ([place])  4. Obesity with starting BMI of 53.6 Penny Huerta is currently in the action stage of change. As such, her goal is  to continue with weight loss efforts. She has agreed to the Category 4 Plan.   Exercise goals: All adults should avoid inactivity. Some physical activity is better than none, and adults who participate in any amount of physical activity gain some health benefits.  Behavioral modification strategies: increasing lean protein intake, meal planning and cooking strategies, keeping healthy foods in the home, and planning for success.  Penny Huerta has agreed to follow-up with our clinic in 4 weeks. She was informed of the importance of frequent follow-up visits to maximize her success with intensive lifestyle modifications for her multiple health conditions.   Objective:   Blood pressure 136/82, pulse 71, temperature 98.5 F (36.9 C), height '5\' 5"'$  (1.651 m), weight (!) 330 lb (149.7 kg), last menstrual period 01/06/2012, SpO2 95 %. Body mass index is 54.91 kg/m.  General: Cooperative, alert, well developed, in no acute distress. HEENT: Conjunctivae and lids unremarkable. Cardiovascular: Regular rhythm.  Lungs: Normal work of breathing. Neurologic: No focal deficits.   Lab Results  Component Value Date   CREATININE 0.57 07/01/2022   BUN 13 07/01/2022   NA 140 07/01/2022   K 4.7 07/01/2022   CL 101 07/01/2022   CO2 23 07/01/2022   Lab Results  Component Value Date   ALT 23 07/01/2022   AST 20 07/01/2022   ALKPHOS 85 07/01/2022   BILITOT 0.3 07/01/2022   Lab Results  Component Value Date   HGBA1C 6.2 (H) 07/01/2022   HGBA1C 6.0 (H) 12/06/2021   HGBA1C 5.8 (H) 03/05/2021  HGBA1C 6.4 (H) 11/25/2020   HGBA1C 7.6 (H) 07/26/2020   Lab Results  Component Value Date   INSULIN 107.0 (H) 07/01/2022   Lab Results  Component Value Date   TSH 1.280 07/01/2022   Lab Results  Component Value Date   CHOL 236 (H) 07/01/2022   HDL 33 (L) 07/01/2022   LDLCALC 154 (H) 07/01/2022   LDLDIRECT 214.4 03/10/2013   TRIG 262 (H) 07/01/2022   CHOLHDL 12.6 12/06/2021   Lab Results  Component  Value Date   VD25OH 17.6 (L) 07/01/2022   Lab Results  Component Value Date   WBC 8.6 07/01/2022   HGB 13.0 07/01/2022   HCT 41.3 07/01/2022   MCV 81 07/01/2022   PLT 304 07/01/2022   Lab Results  Component Value Date   IRON 60 07/01/2022   TIBC 347 07/01/2022   FERRITIN 50 07/01/2022   Attestation Statements:   Reviewed by clinician on day of visit: allergies, medications, problem list, medical history, surgical history, family history, social history, and previous encounter notes.  I, Elnora Morrison, RMA am acting as transcriptionist for Coralie Common, MD.  I have reviewed the above documentation for accuracy and completeness, and I agree with the above. - Coralie Common, MD

## 2022-11-10 ENCOUNTER — Ambulatory Visit (INDEPENDENT_AMBULATORY_CARE_PROVIDER_SITE_OTHER): Payer: BC Managed Care – PPO | Admitting: Family Medicine

## 2022-12-09 ENCOUNTER — Telehealth: Payer: Self-pay | Admitting: Internal Medicine

## 2022-12-09 DIAGNOSIS — E1169 Type 2 diabetes mellitus with other specified complication: Secondary | ICD-10-CM

## 2022-12-09 DIAGNOSIS — E7801 Familial hypercholesterolemia: Secondary | ICD-10-CM

## 2022-12-09 NOTE — Telephone Encounter (Signed)
Patient would like know if she need to have lab work done prior to her visit with Dr. Debara Pickett on 6/21.

## 2022-12-09 NOTE — Telephone Encounter (Signed)
Patient aware labs needed prior to appt.  NMR ordered.

## 2022-12-16 DIAGNOSIS — R209 Unspecified disturbances of skin sensation: Secondary | ICD-10-CM | POA: Diagnosis not present

## 2022-12-16 DIAGNOSIS — I10 Essential (primary) hypertension: Secondary | ICD-10-CM | POA: Diagnosis not present

## 2022-12-16 DIAGNOSIS — F330 Major depressive disorder, recurrent, mild: Secondary | ICD-10-CM | POA: Diagnosis not present

## 2022-12-24 ENCOUNTER — Ambulatory Visit: Payer: BC Managed Care – PPO | Admitting: Internal Medicine

## 2023-01-11 ENCOUNTER — Telehealth: Payer: Self-pay

## 2023-01-11 NOTE — Telephone Encounter (Signed)
-----   Message from Carolan Clines, CPhT sent at 01/11/2023 11:37 AM EDT ----- Regarding: RE: PA renewal - repatha Carolyne Fiscal,  I see the new key for this patients p/a for Repatha is ([id number]) but at this point we wouldn't send this p/a off without having any updated labs. The ins would likely deny it. Or I can just submit it if you want. ----- Message ----- From: Lindell Spar, RN Sent: 01/08/2023   4:19 PM EDT To: Rx Prior Auth Team Subject: PA renewal - repatha                           (Key: [id number])  Patient needs PA renewal for Repatha [employer]  Thanks

## 2023-01-11 NOTE — Telephone Encounter (Signed)
Pharmacy Patient Advocate Encounter   Received notification from RN  that prior authorization for Repatha 140mg /ml is required/requested.    PA submitted on 4.29.24 to (ins) [employer]bs via [employer] or (Medicaid) confirmation # [id number]  Status is pending  (P/a submitted with labs from 06/2022

## 2023-01-13 NOTE — Telephone Encounter (Signed)
Pharmacy Patient Advocate Encounter  Prior Authorization for Repatha 140mg /ml has been approved by [employer] (ins).    KEY # [id number] Effective dates: 4.26.24 through 4.26.25

## 2023-01-22 ENCOUNTER — Other Ambulatory Visit: Payer: Self-pay | Admitting: Internal Medicine

## 2023-01-31 ENCOUNTER — Other Ambulatory Visit (HOSPITAL_BASED_OUTPATIENT_CLINIC_OR_DEPARTMENT_OTHER): Payer: Self-pay | Admitting: Internal Medicine

## 2023-02-01 ENCOUNTER — Other Ambulatory Visit (HOSPITAL_BASED_OUTPATIENT_CLINIC_OR_DEPARTMENT_OTHER): Payer: Self-pay | Admitting: Internal Medicine

## 2023-03-05 ENCOUNTER — Encounter: Payer: Self-pay | Admitting: Internal Medicine

## 2023-03-05 ENCOUNTER — Ambulatory Visit: Payer: BC Managed Care – PPO | Attending: Internal Medicine | Admitting: Internal Medicine

## 2023-03-05 VITALS — BP 128/72 | HR 82 | Ht 65.0 in | Wt 356.0 lb

## 2023-03-05 DIAGNOSIS — E118 Type 2 diabetes mellitus with unspecified complications: Secondary | ICD-10-CM | POA: Diagnosis not present

## 2023-03-05 DIAGNOSIS — E7801 Familial hypercholesterolemia: Secondary | ICD-10-CM | POA: Diagnosis not present

## 2023-03-05 DIAGNOSIS — T466X5D Adverse effect of antihyperlipidemic and antiarteriosclerotic drugs, subsequent encounter: Secondary | ICD-10-CM

## 2023-03-05 DIAGNOSIS — M791 Myalgia, unspecified site: Secondary | ICD-10-CM

## 2023-03-05 DIAGNOSIS — Z951 Presence of aortocoronary bypass graft: Secondary | ICD-10-CM | POA: Diagnosis not present

## 2023-03-05 DIAGNOSIS — T466X5A Adverse effect of antihyperlipidemic and antiarteriosclerotic drugs, initial encounter: Secondary | ICD-10-CM

## 2023-03-05 DIAGNOSIS — Z7984 Long term (current) use of oral hypoglycemic drugs: Secondary | ICD-10-CM

## 2023-03-05 DIAGNOSIS — E785 Hyperlipidemia, unspecified: Secondary | ICD-10-CM | POA: Diagnosis not present

## 2023-03-05 DIAGNOSIS — E1169 Type 2 diabetes mellitus with other specified complication: Secondary | ICD-10-CM | POA: Diagnosis not present

## 2023-03-05 MED ORDER — REPATHA SURECLICK 140 MG/ML ~~LOC~~ SOAJ: SUBCUTANEOUS | 3 refills | Status: DC

## 2023-03-05 NOTE — Patient Instructions (Signed)
Medication Instructions:  NO CHANGES today   *If you need a refill on your cardiac medications before your next appointment, please call your pharmacy*   Lab Work: NMR lipoprofile today   If you have labs (blood work) drawn today and your tests are completely normal, you will receive your results only by: MyChart Message (if you have MyChart) OR A paper copy in the mail If you have any lab test that is abnormal or we need to change your treatment, we will call you to review the results.   Follow-Up: At [facility], you and your health needs are our priority.  As part of our continuing mission to provide you with exceptional heart care, we have created designated Provider Care Teams.  These Care Teams include your primary Cardiologist (physician) and Advanced Practice Providers (APPs -  Physician Assistants and Nurse Practitioners) who all work together to provide you with the care you need, when you need it.  We recommend signing up for the patient portal called "MyChart".  Sign up information is provided on this After Visit Summary.  MyChart is used to connect with patients for Virtual Visits (Telemedicine).  Patients are able to view lab/test results, encounter notes, upcoming appointments, etc.  Non-urgent messages can be sent to your provider as well.   To learn more about what you can do with MyChart, go to [email or web address].    Your next appointment:    12 months with Dr. Rennis Golden

## 2023-03-05 NOTE — Progress Notes (Signed)
LIPID CLINIC CONSULT NOTE  Chief Complaint:  Follow-up dyslipidemia  Primary Care Physician: [name], MD  Primary Cardiologist:  Orbie Pyo, MD  HPI:  Penny Huerta is a 57 y.o. female who is being seen today for the evaluation of dyslipidemia at the request of [name], MD. This is a pleasant female that I met initially in the hospital in March at the time when she presented with chest pain.  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  Percutaneous intervention versus medical therapy was recommended.  This subsequently was reviewed by Dr. Lynnette Caffey and she ultimately sought out another opinion at [facility]  She then underwent coronary artery bypass grafting x 4 with Dr. Nevada Crane about a week ago, this included SVG to the PDA, SVG to OM1, SVG to first diagonal and LIMA to LAD.  She is tolerated this well although is in some discomfort.  Prior to surgery, I had advised her to start on ezetimibe because of statin intolerance.  She has had a very high cholesterol and probable familial hyperlipidemia.  LDL cholesterol has been consistently in the mid 200s more recently with LDL 249 off treatment.  So far she seems to be tolerating ezetimibe but could not take the statins due to significant myalgias.  She is a very good candidate for PCSK9 inhibitor.  05/01/2022  Penny Huerta returns today for follow-up of dyslipidemia.  She is currently on Repatha 140 mg q2w and zetia 10 mg daily.  She has also recently been referred to [facility] weight [facility].  She has had further improvement in her cholesterol, however her numbers remain elevated.  LDL particle #[id number].  LDL-C of 148, HDL-C of 27, triglycerides 318 and total cholesterol 234.  Previously her LDL-C was 227 in March 2023 therefore this represents significant improvement however she still remains well above  targets.  03/05/2023  Penny Huerta is seen today in follow-up.  She has been on Repatha and ezetimibe.  She has not had recent lipid testing.  She says it has been difficult for her to get to get fasting lab work done.  I did offer her nonfasting lab work today which she was agreeable to.  PMHx:  Past Medical History:  Diagnosis Date   Allergy    Anxiety    Atrial fibrillation (HCC)    B12 deficiency    Carotid stenosis    Chest pain    Diabetes mellitus without complication (HCC)    Family history of hyperlipidemia    Heart murmur    ARRHYTHMIA   History of drug use    Hx of CABG    Hyperlipidemia    Hypertension    Obesity    SOB (shortness of breath)    Stroke [facility])     Past Surgical History:  Procedure Laterality Date   CORONARY ARTERY BYPASS GRAFT  01/31/2022   x 4   HYSTEROSCOPY W/ ENDOMETRIAL ABLATION  09/14/2009   LEFT HEART CATH AND CORONARY ANGIOGRAPHY N/A 12/05/2021   Procedure: LEFT HEART CATH AND CORONARY ANGIOGRAPHY;  Surgeon: Orbie Pyo, MD;  Location: [facility] INVASIVE CV LAB;  Service: Cardiovascular;  Laterality: N/A;    FAMHx:  Family History  Problem Relation Age of Onset   Heart disease Mother    Stroke Mother    Hyperlipidemia Mother    Transient ischemic  attack Mother    Hypertension Mother    Diabetes Mother    Depression Mother    Anxiety disorder Mother    Obesity Mother    Stroke Father    Transient ischemic attack Father    Heart disease Maternal Grandmother 72   Hyperlipidemia Maternal Grandmother    Hyperlipidemia Maternal Grandfather    Heart disease Maternal Grandfather    Ovarian cancer Paternal Grandmother    Stroke Paternal Grandfather    Hypertension Paternal Grandfather    Diabetes Paternal Grandfather    Sudden death Maternal Uncle     SOCHx:   reports that she quit smoking about 25 years ago. Her smoking use included cigarettes. She has never used smokeless tobacco. She reports that she does not currently use alcohol.  She reports that she does not currently use drugs.  ALLERGIES:  Allergies  Allergen Reactions   Alprazolam Other (See Comments)    Caused mood changes   Citalopram Hydrobromide Other (See Comments)    Mood changes   Lisinopril Cough   Nitrofurantoin Monohyd Macro Nausea And Vomiting   Statins Other (See Comments)    MUSCLE LOSS - lipitor, zocor   Sulfa Drugs Cross Reactors Nausea And Vomiting    ROS: Pertinent items noted in HPI and remainder of comprehensive ROS otherwise negative.  HOME MEDS: Current Outpatient Medications on File Prior to Visit  Medication Sig Dispense Refill   acetaminophen (TYLENOL) 500 MG tablet Take 500 mg by mouth every 6 (six) hours as needed for mild pain.     aspirin EC 81 MG tablet Take 81 mg by mouth daily. Swallow whole.     clopidogrel (PLAVIX) 75 MG tablet Take 1 tablet by mouth daily.     Evolocumab (REPATHA SURECLICK) 140 MG/ML SOAJ INJECT 1 DOSE INTO THE SKIN EVERY 14 DAYS 6 mL 0   ezetimibe (ZETIA) 10 MG tablet TAKE 1 TABLET(10 MG) BY MOUTH DAILY 30 tablet 0   losartan (COZAAR) 25 MG tablet Take 0.5 tablets (12.5 mg total) by mouth at bedtime. 90 tablet 3   metFORMIN (GLUCOPHAGE-XR) 500 MG 24 hr tablet Take 500 mg by mouth 2 (two) times daily.     metoprolol tartrate (LOPRESSOR) 25 MG tablet Take 37.5 mg by mouth 2 (two) times daily.     sertraline (ZOLOFT) 50 MG tablet Take 1 tablet (50 mg total) by mouth daily. 90 tablet 0   Vitamin D, Ergocalciferol, (DRISDOL) 1.25 MG (50000 UNIT) CAPS capsule Take 50,000 Units by mouth every 7 (seven) days.     No current facility-administered medications on file prior to visit.    LABS/IMAGING: No results found for this or any previous visit (from the past 48 hour(s)). No results found.  LIPID PANEL:    Component Value Date/Time   CHOL 236 (H) 07/01/2022 0857   TRIG 262 (H) 07/01/2022 0857   HDL 33 (L) 07/01/2022 0857   CHOLHDL 12.6 12/06/2021 0742   VLDL 62 (H) 12/06/2021 0742   LDLCALC 154  (H) 07/01/2022 0857   LDLDIRECT 214.4 03/10/2013 1109    WEIGHTS: Wt Readings from Last 3 Encounters:  03/05/23 (!) 356 lb (161.5 kg)  10/19/22 (!) 330 lb (149.7 kg)  10/01/22 (!) 331 lb (150.1 kg)    VITALS: BP 128/72 (BP Location: Left Arm, Patient Position: Sitting, Cuff Size: Large)   Pulse 82   Ht 5\' 5"  (1.651 m)   Wt (!) 356 lb (161.5 kg)   LMP 01/06/2012   SpO2 94%  BMI 59.24 kg/m   EXAM: Deferred  EKG: Deferred  ASSESSMENT: CAD status post CABG x4 (LIMA to LAD, SVG to PDA, SVG to OM1 and SVG to diagonal) -01/2022, [facility]-Dr. Nevada Crane Probable familial hyperlipidemia -goal LDL less than 55. Statin intolerant-myalgias Bilateral carotid artery stenosis Morbid obesity Type 2 diabetes Hypertension  PLAN: 1.   Penny Huerta needs repeat lab work which we will order today.  She may need further therapy including possibility of Nexletol in addition to the ezetimibe for additional benefit.  Her target LDL is less than 55.  She seems asymptomatic without chest pain and is tolerating the Repatha injections well.  Blood pressure is also well-controlled.  Plan follow-up with me annually or sooner as necessary.  Chrystie Nose, MD, [facility], FACP  [place]  [facility]  Medical Director of the Advanced Lipid Disorders &  Cardiovascular Risk Reduction Clinic Diplomate of the American Board of Clinical Lipidology Attending Cardiologist  Direct Dial: [phone]  Fax: [phone]  Website:  [email or web address] 03/05/2023, 3:36 PM

## 2023-03-06 LAB — NMR, LIPOPROFILE
Cholesterol, Total: 311 mg/dL — ABNORMAL HIGH (ref 100–199)
HDL Particle Number: 17.6 umol/L — ABNORMAL LOW (ref >=30.5)
HDL-C: 24 mg/dL — ABNORMAL LOW (ref >39)
LDL Particle Number: 3308 nmol/L — ABNORMAL HIGH (ref <1000)
LDL Size: 20.2 nm — ABNORMAL LOW (ref >20.5)
LDL-C (NIH Calc): 181 mg/dL — ABNORMAL HIGH (ref 0–99)
LP-IR Score: 100 — ABNORMAL HIGH (ref <=45)
Small LDL Particle Number: 2156 nmol/L — ABNORMAL HIGH (ref <=527)
Triglycerides: 514 mg/dL — ABNORMAL HIGH (ref 0–149)

## 2023-03-07 ENCOUNTER — Other Ambulatory Visit (HOSPITAL_BASED_OUTPATIENT_CLINIC_OR_DEPARTMENT_OTHER): Payer: Self-pay | Admitting: Internal Medicine

## 2023-03-08 DIAGNOSIS — E7801 Familial hypercholesterolemia: Secondary | ICD-10-CM

## 2023-03-16 ENCOUNTER — Other Ambulatory Visit: Payer: Self-pay | Admitting: Internal Medicine

## 2023-03-28 DIAGNOSIS — R300 Dysuria: Secondary | ICD-10-CM | POA: Diagnosis not present

## 2023-03-28 DIAGNOSIS — R110 Nausea: Secondary | ICD-10-CM | POA: Diagnosis not present

## 2023-03-28 DIAGNOSIS — R509 Fever, unspecified: Secondary | ICD-10-CM | POA: Diagnosis not present

## 2023-03-28 DIAGNOSIS — R8290 Unspecified abnormal findings in urine: Secondary | ICD-10-CM | POA: Diagnosis not present

## 2023-04-02 DIAGNOSIS — R5383 Other fatigue: Secondary | ICD-10-CM | POA: Diagnosis not present

## 2023-04-02 DIAGNOSIS — R4189 Other symptoms and signs involving cognitive functions and awareness: Secondary | ICD-10-CM | POA: Diagnosis not present

## 2023-04-02 DIAGNOSIS — Z87440 Personal history of urinary (tract) infections: Secondary | ICD-10-CM | POA: Diagnosis not present

## 2023-04-02 DIAGNOSIS — I10 Essential (primary) hypertension: Secondary | ICD-10-CM | POA: Diagnosis not present

## 2023-04-02 DIAGNOSIS — R921 Mammographic calcification found on diagnostic imaging of breast: Secondary | ICD-10-CM | POA: Diagnosis not present

## 2023-04-04 ENCOUNTER — Other Ambulatory Visit (HOSPITAL_BASED_OUTPATIENT_CLINIC_OR_DEPARTMENT_OTHER): Payer: Self-pay | Admitting: Internal Medicine

## 2023-04-06 DIAGNOSIS — I10 Essential (primary) hypertension: Secondary | ICD-10-CM | POA: Diagnosis not present

## 2023-04-06 DIAGNOSIS — R319 Hematuria, unspecified: Secondary | ICD-10-CM | POA: Diagnosis not present

## 2023-05-16 DIAGNOSIS — C801 Malignant (primary) neoplasm, unspecified: Secondary | ICD-10-CM

## 2023-05-16 HISTORY — DX: Malignant (primary) neoplasm, unspecified: C80.1

## 2023-06-08 ENCOUNTER — Encounter: Payer: Self-pay | Admitting: Psychiatry

## 2023-06-14 ENCOUNTER — Inpatient Hospital Stay: Payer: BC Managed Care – PPO | Attending: Psychiatry | Admitting: Psychiatry

## 2023-06-14 ENCOUNTER — Ambulatory Visit (HOSPITAL_BASED_OUTPATIENT_CLINIC_OR_DEPARTMENT_OTHER): Payer: BC Managed Care – PPO | Admitting: Gynecologic Oncology

## 2023-06-14 ENCOUNTER — Encounter: Payer: Self-pay | Admitting: Psychiatry

## 2023-06-14 VITALS — BP 130/63 | HR 71 | Temp 98.6°F | Resp 19 | Ht 65.0 in | Wt 348.2 lb

## 2023-06-14 DIAGNOSIS — Z6841 Body Mass Index (BMI) 40.0 and over, adult: Secondary | ICD-10-CM | POA: Diagnosis not present

## 2023-06-14 DIAGNOSIS — Z7902 Long term (current) use of antithrombotics/antiplatelets: Secondary | ICD-10-CM | POA: Diagnosis not present

## 2023-06-14 DIAGNOSIS — Z951 Presence of aortocoronary bypass graft: Secondary | ICD-10-CM | POA: Insufficient documentation

## 2023-06-14 DIAGNOSIS — Z7984 Long term (current) use of oral hypoglycemic drugs: Secondary | ICD-10-CM | POA: Diagnosis not present

## 2023-06-14 DIAGNOSIS — N950 Postmenopausal bleeding: Secondary | ICD-10-CM

## 2023-06-14 DIAGNOSIS — Z8041 Family history of malignant neoplasm of ovary: Secondary | ICD-10-CM | POA: Insufficient documentation

## 2023-06-14 DIAGNOSIS — Z7982 Long term (current) use of aspirin: Secondary | ICD-10-CM | POA: Insufficient documentation

## 2023-06-14 DIAGNOSIS — E538 Deficiency of other specified B group vitamins: Secondary | ICD-10-CM | POA: Diagnosis not present

## 2023-06-14 DIAGNOSIS — E785 Hyperlipidemia, unspecified: Secondary | ICD-10-CM | POA: Diagnosis not present

## 2023-06-14 DIAGNOSIS — E119 Type 2 diabetes mellitus without complications: Secondary | ICD-10-CM | POA: Diagnosis not present

## 2023-06-14 DIAGNOSIS — Z79899 Other long term (current) drug therapy: Secondary | ICD-10-CM | POA: Diagnosis not present

## 2023-06-14 DIAGNOSIS — R011 Cardiac murmur, unspecified: Secondary | ICD-10-CM | POA: Diagnosis not present

## 2023-06-14 DIAGNOSIS — C541 Malignant neoplasm of endometrium: Secondary | ICD-10-CM | POA: Insufficient documentation

## 2023-06-14 DIAGNOSIS — I2510 Atherosclerotic heart disease of native coronary artery without angina pectoris: Secondary | ICD-10-CM | POA: Diagnosis not present

## 2023-06-14 DIAGNOSIS — F419 Anxiety disorder, unspecified: Secondary | ICD-10-CM | POA: Insufficient documentation

## 2023-06-14 DIAGNOSIS — I4891 Unspecified atrial fibrillation: Secondary | ICD-10-CM | POA: Diagnosis not present

## 2023-06-14 DIAGNOSIS — I10 Essential (primary) hypertension: Secondary | ICD-10-CM | POA: Insufficient documentation

## 2023-06-14 DIAGNOSIS — Z8673 Personal history of transient ischemic attack (TIA), and cerebral infarction without residual deficits: Secondary | ICD-10-CM | POA: Insufficient documentation

## 2023-06-14 DIAGNOSIS — E1165 Type 2 diabetes mellitus with hyperglycemia: Secondary | ICD-10-CM

## 2023-06-14 MED ORDER — OXYCODONE HCL 5 MG PO TABS
5.0000 mg | ORAL_TABLET | ORAL | 0 refills | Status: DC | PRN
Start: 2023-06-14 — End: 2023-07-13

## 2023-06-14 MED ORDER — SENNOSIDES-DOCUSATE SODIUM 8.6-50 MG PO TABS
2.0000 | ORAL_TABLET | Freq: Every day | ORAL | 0 refills | Status: DC
Start: 2023-06-14 — End: 2023-07-13

## 2023-06-14 NOTE — Patient Instructions (Addendum)
WE WILL NEED TO OBTAIN CLEARANCE FOR SURGERY FROM YOUR CARDIOLOGIST ALONG WITH RECOMMENDATIONS FOR YOUR PLAVIX.   Preparing for your Surgery  Plan for surgery on July 06, 2023 with Dr. Clide Cliff at [facility]. You will be scheduled for robotic assisted total laparoscopic hysterectomy (removal of the uterus and cervix), bilateral salpingo-oophorectomy (removal of both ovaries and fallopian tubes), sentinel lymph node biopsy, possible lymph node dissection, possible laparotomy (larger incision on your abdomen if needed).   Pre-operative Testing -You will receive a phone call from presurgical testing at [facility] to arrange for a pre-operative appointment and lab work.  -Bring your insurance card, copy of an advanced directive if applicable, medication list  -At that visit, you will be asked to sign a consent for a possible blood transfusion in case a transfusion becomes necessary during surgery.  The need for a blood transfusion is rare but having consent is a necessary part of your care.     -You can continue taking your baby aspirin 81 mg with the last dose being the DAY BEFORE SURGERY. DO NOT TAKE THE MORNING OF SURGERY.  -Do not take supplements such as fish oil (omega 3), red yeast rice, turmeric before your surgery. You want to avoid medications with aspirin in them including headache powders such as BC or Goody's), Excedrin migraine.  Day Before Surgery at Home -You will be asked to take in a light diet the day before surgery. You will be advised you can have clear liquids up until 3 hours before your surgery.    Eat a light diet the day before surgery.  Examples including soups, broths, toast, yogurt, mashed potatoes.  AVOID GAS PRODUCING FOODS AND BEVERAGES. Things to avoid include carbonated beverages (fizzy beverages, sodas), raw fruits and raw vegetables (uncooked), or beans.   If your bowels are filled with gas, your surgeon will have difficulty  visualizing your pelvic organs which increases your surgical risks.  Your role in recovery Your role is to become active as soon as directed by your doctor, while still giving yourself time to heal.  Rest when you feel tired. You will be asked to do the following in order to speed your recovery:  - Cough and breathe deeply. This helps to clear and expand your lungs and can prevent pneumonia after surgery.  - STAY ACTIVE WHEN YOU GET HOME. Do mild physical activity. Walking or moving your legs help your circulation and body functions return to normal. Do not try to get up or walk alone the first time after surgery.   -If you develop swelling on one leg or the other, pain in the back of your leg, redness/warmth in one of your legs, please call the office or go to the Emergency Room to have a doppler to rule out a blood clot. For shortness of breath, chest pain-seek care in the Emergency Room as soon as possible. - Actively manage your pain. Managing your pain lets you move in comfort. We will ask you to rate your pain on a scale of zero to 10. It is your responsibility to tell your doctor or nurse where and how much you hurt so your pain can be treated.  Special Considerations -If you are diabetic, you may be placed on insulin after surgery to have closer control over your blood sugars to promote healing and recovery.  This does not mean that you will be discharged on insulin.  If applicable, your oral antidiabetics will be resumed when  you are tolerating a solid diet.  -Your final pathology results from surgery should be available around one week after surgery and the results will be relayed to you when available.  -Dr. Antionette Char is the surgeon that assists your GYN Oncologist with surgery.  If you end up staying the night, the next day after your surgery you will either see Dr. Pricilla Holm, Dr. Alvester Morin, or Dr. Antionette Char.  -FMLA forms can be faxed to [phone] and please allow 5-7  business days for completion.  Pain Management After Surgery -You will be prescribed your pain medication and bowel regimen medications before surgery so that you can have these available when you are discharged from the hospital. The pain medication is for use ONLY AFTER surgery and a new prescription will not be given.   -Make sure that you have Tylenol IF YOU ARE ABLE TO TAKE THESE MEDICATION at home to use on a regular basis after surgery for pain control.   -Review the attached handout on narcotic use and their risks and side effects.   Bowel Regimen -You will be prescribed Sennakot-S to take nightly to prevent constipation especially if you are taking the narcotic pain medication intermittently.  It is important to prevent constipation and drink adequate amounts of liquids. You can stop taking this medication when you are not taking pain medication and you are back on your normal bowel routine.  Risks of Surgery Risks of surgery are low but include bleeding, infection, damage to surrounding structures, re-operation, blood clots, and very rarely death.   Blood Transfusion Information (For the consent to be signed before surgery)  We will be checking your blood type before surgery so in case of emergencies, we will know what type of blood you would need.                                            WHAT IS A BLOOD TRANSFUSION?  A transfusion is the replacement of blood or some of its parts. Blood is made up of multiple cells which provide different functions. Red blood cells carry oxygen and are used for blood loss replacement. White blood cells fight against infection. Platelets control bleeding. Plasma helps clot blood. Other blood products are available for specialized needs, such as hemophilia or other clotting disorders. BEFORE THE TRANSFUSION  Who gives blood for transfusions?  You may be able to donate blood to be used at a later date on yourself (autologous  donation). Relatives can be asked to donate blood. This is generally not any safer than if you have received blood from a stranger. The same precautions are taken to ensure safety when a relative's blood is donated. Healthy volunteers who are fully evaluated to make sure their blood is safe. This is blood bank blood. Transfusion therapy is the safest it has ever been in the practice of medicine. Before blood is taken from a donor, a complete history is taken to make sure that person has no history of diseases nor engages in risky social behavior (examples are intravenous drug use or sexual activity with multiple partners). The donor's travel history is screened to minimize risk of transmitting infections, such as malaria. The donated blood is tested for signs of infectious diseases, such as HIV and hepatitis. The blood is then tested to be sure it is compatible with you in order to minimize the chance  of a transfusion reaction. If you or a relative donates blood, this is often done in anticipation of surgery and is not appropriate for emergency situations. It takes many days to process the donated blood. RISKS AND COMPLICATIONS Although transfusion therapy is very safe and saves many lives, the main dangers of transfusion include:  Getting an infectious disease. Developing a transfusion reaction. This is an allergic reaction to something in the blood you were given. Every precaution is taken to prevent this. The decision to have a blood transfusion has been considered carefully by your caregiver before blood is given. Blood is not given unless the benefits outweigh the risks.  AFTER SURGERY INSTRUCTIONS  Return to work: 4-6 weeks if applicable  Activity: 1. Be up and out of the bed during the day.  Take a nap if needed.  You may walk up steps but be careful and use the hand rail.  Stair climbing will tire you more than you think, you may need to stop part way and rest.   2. No lifting or straining  for 6 weeks over 10 pounds. No pushing, pulling, straining for 6 weeks.  3. No driving for around 1 week(s).  Do not drive if you are taking narcotic pain medicine and make sure that your reaction time has returned.   4. You can shower as soon as the next day after surgery. Shower daily.  Use your regular soap and water (not directly on the incision) and pat your incision(s) dry afterwards; don't rub.  No tub baths or submerging your body in water until cleared by your surgeon. If you have the soap that was given to you by pre-surgical testing that was used before surgery, you do not need to use it afterwards because this can irritate your incisions.   5. No sexual activity and nothing in the vagina for 12 weeks.  6. You may experience a small amount of clear drainage from your incisions, which is normal.  If the drainage persists, increases, or changes color please call the office.  7. Do not use creams, lotions, or ointments such as neosporin on your incisions after surgery until advised by your surgeon because they can cause removal of the dermabond glue on your incisions.    8. You may experience vaginal spotting after surgery or when the stitches at the top of the vagina begin to dissolve.  The spotting is normal but if you experience heavy bleeding, call our office.  9. Take Tylenol first for pain if you are able to take these medication and only use narcotic pain medication for severe pain not relieved by the Tylenol.  Monitor your Tylenol intake to a max of 4,000 mg in a 24 hour period.   Diet: 1. Low sodium Heart Healthy Diet is recommended but you are cleared to resume your normal (before surgery) diet after your procedure.  2. It is safe to use a laxative, such as Miralax or Colace, if you have difficulty moving your bowels. You have been prescribed Sennakot-S to take at bedtime every evening after surgery to keep bowel movements regular and to prevent constipation.    Wound Care: 1.  Keep clean and dry.  Shower daily.  Reasons to call the Doctor: Fever - Oral temperature greater than 100.4 degrees Fahrenheit Foul-smelling vaginal discharge Difficulty urinating Nausea and vomiting Increased pain at the site of the incision that is unrelieved with pain medicine. Difficulty breathing with or without chest pain New calf pain especially if only  on one side Sudden, continuing increased vaginal bleeding with or without clots.   Contacts: For questions or concerns you should contact:  Dr. Clide Cliff at [phone]  Warner Mccreedy, NP at [phone]  After Hours: call [phone] and have the GYN Oncologist paged/contacted (after 5 pm or on the weekends). You will speak with an after hours RN and let he or she know you have had surgery.  Messages sent via mychart are for non-urgent matters and are not responded to after hours so for urgent needs, please call the after hours number.

## 2023-06-14 NOTE — Progress Notes (Signed)
GYNECOLOGIC ONCOLOGY NEW PATIENT CONSULTATION  Date of Service: 06/14/2023 Referring Provider: Shea Evans, MD   ASSESSMENT AND PLAN: Penny Huerta is a 57 y.o. woman with FIGO grade 1 endometrial cancer, medically complicated by coronary artery disease with CABG in May 2023.  We reviewed the nature of endometrial cancer and its recommended surgical staging, including total hysterectomy, bilateral salpingo-oophorectomy, and lymph node assessment. The patient is a suitable candidate for staging via a minimally invasive approach to surgery.  We reviewed that robotic assistance would be used to complete the surgery. We discussed that most endometrial cancer is detected early and that decisions regarding adjuvant therapy will be made based on her final pathology.   We reviewed the sentinel lymph node technique. Risks and benefits of sentinel lymph node biopsy was reviewed. We reviewed the technique and ICG dye. The patient DOES NOT have an iodine allergy or known liver dysfunction. We reviewed the false negative rate (0.4%), and that 3% of patients with metastatic disease will not have it detected by SLN biopsy in endometrial cancer. A low risk of allergic reaction to the dye, <0.2% for ICG, has been reported. We also discussed that in the case of failed mapping, which occurs 40% of the time, a bilateral or unilateral lymphadenectomy will be performed at the surgeon's discretion.   Potential benefits of sentinel nodes including a higher detection rate for metastasis due to ultrastaging and potential reduction in operative morbidity. However, there remains uncertainty as to the role for treatment of micrometastatic disease. Further, the benefit of operative morbidity associated with the SLN technique in endometrial cancer is not yet completely known. In other patient populations (e.g. the cervical cancer population) there has been observed reductions in morbidity with SLN biopsy compared to pelvic  lymphadenectomy. Lymphedema, nerve dysfunction and lymphocysts are all potential risks with the SLN technique as with complete lymphadenectomy. Additional risks to the patient include the risk of damage to an internal organ while operating in an altered view (e.g. the black and white image of the robotic fluorescence imaging mode).   Patient was consented for: Robotic assisted total laparoscopic hysterectomy, bilateral salpingo-oophorectomy, sentinel lymph node evaluation and biopsy, possible lymph node dissection, possible dilation and curettage intrauterine device insertion on 07/06/23.  We did review that if she were to not tolerate Trendelenburg due to BMI 58, would recommend dilation and curettage with intrauterine device placement at that time.  Patient has had 2 prior endometrial ablations, so this approach is not necessarily ideal, but may be considered as a temporizing measure until medically optimized.  The risks of surgery were discussed in detail and she understands these to including but not limited to bleeding requiring a blood transfusion, infection, injury to adjacent organs (including but not limited to the bowels, bladder, ureters, nerves, blood vessels), thromboembolic events, wound separation, hernia, vaginal cuff separation, possible risk of lymphedema and lymphocyst if lymphadenectomy performed, unforseen complication, possible need for re-exploration, and medical complications such as heart attack, stroke, pneumonia.  If the patient experiences any of these events, she understands that her hospitalization or recovery may be prolonged and that she may need to take additional medications for a prolonged period. The patient will receive DVT and antibiotic prophylaxis as indicated. She voiced a clear understanding. She had the opportunity to ask questions and informed consent was obtained today. She wishes to proceed.  Patient will require cardiac clearance.  Can continue LDASA  preoperatively, hold on day of surgery.  Will confirm with her cardiology team  that she is okay to hold Plavix 5 days preop, including day of surgery.  Reviewed that her diabetes increases her surgical complication risk.  However, at this time her A1c is less than 8, so appropriate to proceed with surgery.  All preoperative instructions were reviewed. Postoperative expectations were also reviewed. Written handouts were provided to the patient.   A copy of this note was sent to the patient's referring provider.  Clide Cliff, MD Gynecologic Oncology   Medical Decision Making I personally spent  TOTAL 60 minutes face-to-face and non-face-to-face in the care of this patient, which includes all pre, intra, and post visit time on the date of service.  ------------  CC: Endometrial cancer  HISTORY OF PRESENT ILLNESS:  Penny Huerta is a 57 y.o. woman who is seen in consultation at the request of Shea Evans, MD for evaluation of endometrial cancer.  Patient presented to her OB/GYN for evaluation of postmenopausal bleeding.  She had an endometrial biopsy performed on 05/28/2023 which returned with FIGO grade 1 endometrioid endometrial cancer.  She also had a pelvic ultrasound performed on 06/07/2023 which showed a uterus measuring 8.3 x 5.9 x 4.9 cm with an endometrial stripe of 16.4 mm.  Today patient presents with her husband and daughter.  She reports currently having minimal spotting.  She first presented to her doctor in July but, looking back now, thinks she has had some irregular light bleeding possibly longer than that.  She first experienced some abdominal cramping in November of last year and has had on and off mild cramping since.  She otherwise denies abdominal bloating, early satiety, significant weight loss, change in bowel or bladder habits.  She underwent a CABG in May 2023.  No stents.  Follows with Dr. Duanne Limerick with cardiology at [facility].  She was last seen on 02/17/2022 and  has been working with cardiac rehab since.  She also reports a history of possible stroke.  This was an incidental finding on imaging and suspected to have occurred in the past at unknown time.  Reports that she struggles with word finding and memory at times.  In terms of her diabetes, this is followed by her PCP.  Her last A1c was checked in 04/09/2023 and measured 7.5.  She works part-time with office work.  TREATMENT HISTORY: Oncology History  Endometrial cancer, grade I (HCC)  05/28/2023 Initial Biopsy   Endometrial biopsy: Well-differentiated endometrial adenocarcinoma (FIGO I)   05/28/2023 Initial Diagnosis   Endometrial cancer, grade I (HCC)   06/07/2023 Imaging   Pelvic ultrasound: Impression: Technically difficult study due to patient BMI There appears to be a post left intramural fibroid 2.1 x 2.1 x 1.8 cm. The endometrium appears focally thickened towards the fundus measuring 16.4 mm.  Cannot rule out polyp versus other. Limited TAS was added to evaluate ovaries. The right ovary was not visualized. No free fluid.     PAST MEDICAL HISTORY: Past Medical History:  Diagnosis Date   Allergy    Anxiety    Atrial fibrillation (HCC)    B12 deficiency    Carotid stenosis    Chest pain    Diabetes mellitus without complication (HCC)    Family history of hyperlipidemia    Heart murmur    ARRHYTHMIA   History of drug use    Hx of CABG    Hyperlipidemia    Hypertension    Obesity    SOB (shortness of breath)    Stroke (HCC)  PAST SURGICAL HISTORY: Past Surgical History:  Procedure Laterality Date   CORONARY ARTERY BYPASS GRAFT  01/31/2022   x 4   HYSTEROSCOPY W/ ENDOMETRIAL ABLATION  09/14/2009   x2   LEFT HEART CATH AND CORONARY ANGIOGRAPHY N/A 12/05/2021   Procedure: LEFT HEART CATH AND CORONARY ANGIOGRAPHY;  Surgeon: Orbie Pyo, MD;  Location: [facility];  Service: Cardiovascular;  Laterality: N/A;    OB/GYN HISTORY: OB History  Gravida Para  Term Preterm AB Living  5 2 2   3     SAB IAB Ectopic Multiple Live Births  1 2          # Outcome Date GA Lbr Len/2nd Weight Sex Type Anes PTL Lv  5 IAB           4 IAB           3 SAB           2 Term      Vag-Spont     1 Term      Vag-Spont         Age at menarche: 48 Age at menopause: late 71s early 75s for hotflashes, s/p endometrial ablation otherwise with limited bleeding since 2012 Hx of HRT: no Hx of STI: no Last pap: 04/09/23 nml History of abnormal pap smears: no  SCREENING STUDIES:  Last mammogram: 7/24 Last colonoscopy: none  MEDICATIONS:  Current Outpatient Medications:    acetaminophen (TYLENOL) 500 MG tablet, Take 500 mg by mouth every 6 (six) hours as needed for mild pain., Disp: , Rfl:    aspirin EC 81 MG tablet, Take 81 mg by mouth daily. Swallow whole., Disp: , Rfl:    clopidogrel (PLAVIX) 75 MG tablet, Take 1 tablet by mouth daily., Disp: , Rfl:    Evolocumab (REPATHA SURECLICK) 140 MG/ML SOAJ, INJECT 1 DOSE INTO THE SKIN EVERY 14 DAYS, Disp: 6 mL, Rfl: 3   ezetimibe (ZETIA) 10 MG tablet, Take 1 tablet (10 mg total) by mouth daily., Disp: 90 tablet, Rfl: 3   losartan (COZAAR) 25 MG tablet, TAKE 1 TABLET(25 MG) BY MOUTH AT BEDTIME, Disp: 90 tablet, Rfl: 3   metFORMIN (GLUCOPHAGE-XR) 500 MG 24 hr tablet, Take 500 mg by mouth 2 (two) times daily., Disp: , Rfl:    metoprolol tartrate (LOPRESSOR) 25 MG tablet, Take 37.5 mg by mouth 2 (two) times daily., Disp: , Rfl:    oxyCODONE (OXY IR/ROXICODONE) 5 MG immediate release tablet, Take 1 tablet (5 mg total) by mouth every 4 (four) hours as needed for severe pain. For AFTER surgery only, do not take and drive, Disp: 10 tablet, Rfl: 0   senna-docusate (SENOKOT-S) 8.6-50 MG tablet, Take 2 tablets by mouth at bedtime. For AFTER surgery, do not take if having diarrhea, Disp: 30 tablet, Rfl: 0   sertraline (ZOLOFT) 50 MG tablet, Take 1 tablet (50 mg total) by mouth daily., Disp: 90 tablet, Rfl: 0   Vitamin D,  Ergocalciferol, (DRISDOL) 1.25 MG (50000 UNIT) CAPS capsule, Take 50,000 Units by mouth every 7 (seven) days., Disp: , Rfl:   ALLERGIES: Allergies  Allergen Reactions   Alprazolam Other (See Comments)    Caused mood changes   Citalopram Hydrobromide Other (See Comments)    Mood changes   Lisinopril Cough   Nitrofurantoin Monohyd Macro Nausea And Vomiting   Statins Other (See Comments)    MUSCLE LOSS - lipitor, zocor   Sulfa Drugs Cross Reactors Nausea And Vomiting    FAMILY HISTORY:  Family History  Problem Relation Age of Onset   Heart disease Mother    Stroke Mother    Hyperlipidemia Mother    Transient ischemic attack Mother    Hypertension Mother    Diabetes Mother    Depression Mother    Anxiety disorder Mother    Obesity Mother    Stroke Father    Transient ischemic attack Father    Sudden death Maternal Uncle    Heart disease Maternal Grandmother 26   Hyperlipidemia Maternal Grandmother    Hyperlipidemia Maternal Grandfather    Heart disease Maternal Grandfather    Ovarian cancer Paternal Grandmother    Stroke Paternal Grandfather    Hypertension Paternal Grandfather    Diabetes Paternal Grandfather    Colon cancer Neg Hx    Breast cancer Neg Hx    Endometrial cancer Neg Hx    Pancreatic cancer Neg Hx    Prostate cancer Neg Hx     SOCIAL HISTORY: Social History   Socioeconomic History   Marital status: Married    Spouse name: Not on file   Number of children: Not on file   Years of education: Not on file   Highest education level: Not on file  Occupational History   Occupation: Print production planner  Tobacco Use   Smoking status: Former    Current packs/day: 0.00    Types: Cigarettes    Start date: 09/14/1977    Quit date: 09/14/1997    Years since quitting: 25.7   Smokeless tobacco: Never  Vaping Use   Vaping status: Never Used  Substance and Sexual Activity   Alcohol use: Not Currently    Comment: h/o heavy use in 80s   Drug use: Not Currently     Comment: "most of them" - quit in 1994   Sexual activity: Not Currently    Partners: Male  Other Topics Concern   Not on file  Social History Narrative   Exercise-- none   Right handed   Social Determinants of Health   Financial Resource Strain: High Risk (10/04/2022)   Received from [facility], [facility]   Overall Financial Resource Strain (CARDIA)    Difficulty of Paying Living Expenses: Hard  Food Insecurity: No Food Insecurity (10/04/2022)   Received from [facility], [facility]   Hunger Vital Sign    Worried About Running Out of Food in the Last Year: Never true    Ran Out of Food in the Last Year: Never true  Transportation Needs: No Transportation Needs (10/04/2022)   Received from [employer], [facility]   PRAPARE - Transportation    Lack of Transportation (Medical): No    Lack of Transportation (Non-Medical): No  Physical Activity: Inactive (10/04/2022)   Received from [facility], [facility]   Exercise Vital Sign    Days of Exercise per Week: 0 days    Minutes of Exercise per Session: 0 min  Stress: Stress Concern Present (10/04/2022)   Received from [facility], [facility] - Occupational Stress Questionnaire    Feeling of Stress : Rather much  Social Connections: Socially Isolated (10/04/2022)   Received from [facility], [facility]   Social Network    How would you rate your social network (family, work, friends)?: Little participation, lonely and socially isolated  Intimate Partner Violence: Not At Risk (10/04/2022)   Received from [facility], [facility]   HITS    Over the last 12 months how often  did your partner physically hurt you?: 1    Over the last 12 months how often did your partner insult you or talk down to you?: 3    Over the last 12 months how often did your partner threaten you with physical harm?: 1    Over the last 12 months how often did your partner scream or curse at  you?: 2    REVIEW OF SYSTEMS: New patient intake form was reviewed.  Complete 10-system review is negative except for the following: Decreased concentration, pelvic pain, anxiety, muscle pain/cramps, itching  PHYSICAL EXAM: BP 130/63 (BP Location: Right Arm, Patient Position: Sitting) Comment: second bp taken.  Pulse 71   Temp 98.6 F (37 C) (Oral)   Resp 19   Ht 5\' 5"  (1.651 m)   Wt (!) 348 lb 4 oz (158 kg)   LMP 01/06/2012   SpO2 97%   BMI 57.95 kg/m  Constitutional: No acute distress. Neuro/Psych: Alert, oriented.  Head and Neck: Normocephalic, atraumatic. Neck symmetric without masses. Sclera anicteric.  Respiratory: Normal work of breathing. Clear to auscultation bilaterally. Scar on chest from CABG Cardiovascular: Regular rate and rhythm, rubs, or gallops. Systolic murmur Abdomen: Normoactive bowel sounds. Soft, non-distended, non-tender to palpation. No masses or appreciated.  Extremities: Grossly normal range of motion. Warm, well perfused. No edema bilaterally. Skin: No rashes or lesions. Lymphatic: No cervical, supraclavicular, or inguinal adenopathy. Genitourinary: External genitalia without lesions. Urethral meatus without lesions or prolapse. On speculum exam, vagina and cervix without lesions. Only can visualize anterior lip of cervix due to vaginal length, body habitus. Bimanual exam reveals normal cervix on palpation. Uterus, adnexal limited evaluation due to body habitus. Exam chaperoned by Warner Mccreedy, NP   LABORATORY AND RADIOLOGIC DATA: Outside medical records were reviewed to synthesize the above history, along with the history and physical obtained during the visit.  Outside laboratory, pathology, and imaging reports were reviewed, with pertinent results below.    WBC  Date Value Ref Range Status  07/01/2022 8.6 3.4 - 10.8 x10E3/uL Final  09/30/2021 10.9 (H) 4.0 - 10.5 K/uL Final   Hemoglobin  Date Value Ref Range Status  07/01/2022 13.0 11.1 - 15.9  g/dL Final   Hematocrit  Date Value Ref Range Status  07/01/2022 41.3 34.0 - 46.6 % Final   Platelets  Date Value Ref Range Status  07/01/2022 304 150 - 450 x10E3/uL Final   LDH  Date Value Ref Range Status  07/26/2020 284 (H) 98 - 192 U/L Final    Comment:    Performed at [facility], [address], [place], [place] [address]   Creat  Date Value Ref Range Status  11/25/2020 0.91 0.50 - 1.05 mg/dL Final    Comment:    For patients >68 years of age, the reference limit for Creatinine is approximately 13% higher for people identified as African-American. .    Creatinine, Ser  Date Value Ref Range Status  07/01/2022 0.57 0.57 - 1.00 mg/dL Final   AST  Date Value Ref Range Status  07/01/2022 20 0 - 40 IU/L Final   ALT  Date Value Ref Range Status  07/01/2022 23 0 - 32 IU/L Final    Surgical pathology (05/28/23): Diagnosis: Well-differentiated endometrial adenocarcinoma (FIGO I)  Pelvic ultrasound (06/07/23): Impression: Technically difficult study due to patient BMI There appears to be a post left intramural fibroid 2.1 x 2.1 x 1.8 cm. The endometrium appears focally thickened towards the fundus measuring 16.4 mm.  Cannot  rule out polyp versus other. Limited TAS was added to evaluate ovaries. The right ovary was not visualized. No free fluid.

## 2023-06-14 NOTE — Progress Notes (Signed)
Patient here for a pre-operative appointment prior to her scheduled surgery on 07/06/2023. She is scheduled for a robotic assisted total laparoscopic hysterectomy, bilateral salpingo-oophorectomy, sentinel lymph node biopsy, possible lymph node dissection, possible laparotomy, possible D&C with Mirena IUD placement.  The surgery was discussed in detail.  See after visit summary for additional details.    Discussed post-op pain management in detail including the aspects of the enhanced recovery pathway.  Advised her that a new prescription would be sent in for Oxycodone and it is only to be used for after her upcoming surgery.  We discussed the use of tylenol post-op and to monitor for a maximum of 4,000 mg in a 24 hour period.  Also prescribed sennakot to be used after surgery and to hold if having loose stools.  Discussed bowel regimen in detail.     Discussed the use of SCDs and measures to take at home to prevent DVT including frequent mobility.  Reportable signs and symptoms of DVT discussed. Post-operative instructions discussed and expectations for after surgery. Incisional care discussed as well including reportable signs and symptoms including erythema, drainage, wound separation.     30 minutes spent with the patient.  Verbalizing understanding of material discussed. No needs or concerns voiced at the end of the visit.   Advised patient to call for any needs.  Advised that her post-operative medications had been prescribed and could be picked up at any time.    This appointment is included in the global surgical bundle as pre-operative teaching and has no charge.

## 2023-06-14 NOTE — H&P (View-Only) (Signed)
GYNECOLOGIC ONCOLOGY NEW PATIENT CONSULTATION  Date of Service: 06/14/2023 Referring Provider: Shea Evans, MD   ASSESSMENT AND PLAN: Penny Huerta is a 57 y.o. woman with FIGO grade 1 endometrial cancer, medically complicated by coronary artery disease with CABG in May 2023.  We reviewed the nature of endometrial cancer and its recommended surgical staging, including total hysterectomy, bilateral salpingo-oophorectomy, and lymph node assessment. The patient is a suitable candidate for staging via a minimally invasive approach to surgery.  We reviewed that robotic assistance would be used to complete the surgery. We discussed that most endometrial cancer is detected early and that decisions regarding adjuvant therapy will be made based on her final pathology.   We reviewed the sentinel lymph node technique. Risks and benefits of sentinel lymph node biopsy was reviewed. We reviewed the technique and ICG dye. The patient DOES NOT have an iodine allergy or known liver dysfunction. We reviewed the false negative rate (0.4%), and that 3% of patients with metastatic disease will not have it detected by SLN biopsy in endometrial cancer. A low risk of allergic reaction to the dye, <0.2% for ICG, has been reported. We also discussed that in the case of failed mapping, which occurs 40% of the time, a bilateral or unilateral lymphadenectomy will be performed at the surgeon's discretion.   Potential benefits of sentinel nodes including a higher detection rate for metastasis due to ultrastaging and potential reduction in operative morbidity. However, there remains uncertainty as to the role for treatment of micrometastatic disease. Further, the benefit of operative morbidity associated with the SLN technique in endometrial cancer is not yet completely known. In other patient populations (e.g. the cervical cancer population) there has been observed reductions in morbidity with SLN biopsy compared to pelvic  lymphadenectomy. Lymphedema, nerve dysfunction and lymphocysts are all potential risks with the SLN technique as with complete lymphadenectomy. Additional risks to the patient include the risk of damage to an internal organ while operating in an altered view (e.g. the black and white image of the robotic fluorescence imaging mode).   Patient was consented for: Robotic assisted total laparoscopic hysterectomy, bilateral salpingo-oophorectomy, sentinel lymph node evaluation and biopsy, possible lymph node dissection, possible dilation and curettage intrauterine device insertion on 07/06/23.  We did review that if she were to not tolerate Trendelenburg due to BMI 58, would recommend dilation and curettage with intrauterine device placement at that time.  Patient has had 2 prior endometrial ablations, so this approach is not necessarily ideal, but may be considered as a temporizing measure until medically optimized.  The risks of surgery were discussed in detail and she understands these to including but not limited to bleeding requiring a blood transfusion, infection, injury to adjacent organs (including but not limited to the bowels, bladder, ureters, nerves, blood vessels), thromboembolic events, wound separation, hernia, vaginal cuff separation, possible risk of lymphedema and lymphocyst if lymphadenectomy performed, unforseen complication, possible need for re-exploration, and medical complications such as heart attack, stroke, pneumonia.  If the patient experiences any of these events, she understands that her hospitalization or recovery may be prolonged and that she may need to take additional medications for a prolonged period. The patient will receive DVT and antibiotic prophylaxis as indicated. She voiced a clear understanding. She had the opportunity to ask questions and informed consent was obtained today. She wishes to proceed.  Patient will require cardiac clearance.  Can continue LDASA  preoperatively, hold on day of surgery.  Will confirm with her cardiology team  that she is okay to hold Plavix 5 days preop, including day of surgery.  Reviewed that her diabetes increases her surgical complication risk.  However, at this time her A1c is less than 8, so appropriate to proceed with surgery.  All preoperative instructions were reviewed. Postoperative expectations were also reviewed. Written handouts were provided to the patient.   A copy of this note was sent to the patient's referring provider.  Clide Cliff, MD Gynecologic Oncology   Medical Decision Making I personally spent  TOTAL 60 minutes face-to-face and non-face-to-face in the care of this patient, which includes all pre, intra, and post visit time on the date of service.  ------------  CC: Endometrial cancer  HISTORY OF PRESENT ILLNESS:  Penny Huerta is a 57 y.o. woman who is seen in consultation at the request of Shea Evans, MD for evaluation of endometrial cancer.  Patient presented to her OB/GYN for evaluation of postmenopausal bleeding.  She had an endometrial biopsy performed on 05/28/2023 which returned with FIGO grade 1 endometrioid endometrial cancer.  She also had a pelvic ultrasound performed on 06/07/2023 which showed a uterus measuring 8.3 x 5.9 x 4.9 cm with an endometrial stripe of 16.4 mm.  Today patient presents with her husband and daughter.  She reports currently having minimal spotting.  She first presented to her doctor in July but, looking back now, thinks she has had some irregular light bleeding possibly longer than that.  She first experienced some abdominal cramping in November of last year and has had on and off mild cramping since.  She otherwise denies abdominal bloating, early satiety, significant weight loss, change in bowel or bladder habits.  She underwent a CABG in May 2023.  No stents.  Follows with Dr. Duanne Limerick with cardiology at [facility].  She was last seen on 02/17/2022 and  has been working with cardiac rehab since.  She also reports a history of possible stroke.  This was an incidental finding on imaging and suspected to have occurred in the past at unknown time.  Reports that she struggles with word finding and memory at times.  In terms of her diabetes, this is followed by her PCP.  Her last A1c was checked in 04/09/2023 and measured 7.5.  She works part-time with office work.  TREATMENT HISTORY: Oncology History  Endometrial cancer, grade I (HCC)  05/28/2023 Initial Biopsy   Endometrial biopsy: Well-differentiated endometrial adenocarcinoma (FIGO I)   05/28/2023 Initial Diagnosis   Endometrial cancer, grade I (HCC)   06/07/2023 Imaging   Pelvic ultrasound: Impression: Technically difficult study due to patient BMI There appears to be a post left intramural fibroid 2.1 x 2.1 x 1.8 cm. The endometrium appears focally thickened towards the fundus measuring 16.4 mm.  Cannot rule out polyp versus other. Limited TAS was added to evaluate ovaries. The right ovary was not visualized. No free fluid.     PAST MEDICAL HISTORY: Past Medical History:  Diagnosis Date   Allergy    Anxiety    Atrial fibrillation (HCC)    B12 deficiency    Carotid stenosis    Chest pain    Diabetes mellitus without complication (HCC)    Family history of hyperlipidemia    Heart murmur    ARRHYTHMIA   History of drug use    Hx of CABG    Hyperlipidemia    Hypertension    Obesity    SOB (shortness of breath)    Stroke (HCC)  PAST SURGICAL HISTORY: Past Surgical History:  Procedure Laterality Date   CORONARY ARTERY BYPASS GRAFT  01/31/2022   x 4   HYSTEROSCOPY W/ ENDOMETRIAL ABLATION  09/14/2009   x2   LEFT HEART CATH AND CORONARY ANGIOGRAPHY N/A 12/05/2021   Procedure: LEFT HEART CATH AND CORONARY ANGIOGRAPHY;  Surgeon: Orbie Pyo, MD;  Location: [facility];  Service: Cardiovascular;  Laterality: N/A;    OB/GYN HISTORY: OB History  Gravida Para  Term Preterm AB Living  5 2 2   3     SAB IAB Ectopic Multiple Live Births  1 2          # Outcome Date GA Lbr Len/2nd Weight Sex Type Anes PTL Lv  5 IAB           4 IAB           3 SAB           2 Term      Vag-Spont     1 Term      Vag-Spont         Age at menarche: 48 Age at menopause: late 71s early 75s for hotflashes, s/p endometrial ablation otherwise with limited bleeding since 2012 Hx of HRT: no Hx of STI: no Last pap: 04/09/23 nml History of abnormal pap smears: no  SCREENING STUDIES:  Last mammogram: 7/24 Last colonoscopy: none  MEDICATIONS:  Current Outpatient Medications:    acetaminophen (TYLENOL) 500 MG tablet, Take 500 mg by mouth every 6 (six) hours as needed for mild pain., Disp: , Rfl:    aspirin EC 81 MG tablet, Take 81 mg by mouth daily. Swallow whole., Disp: , Rfl:    clopidogrel (PLAVIX) 75 MG tablet, Take 1 tablet by mouth daily., Disp: , Rfl:    Evolocumab (REPATHA SURECLICK) 140 MG/ML SOAJ, INJECT 1 DOSE INTO THE SKIN EVERY 14 DAYS, Disp: 6 mL, Rfl: 3   ezetimibe (ZETIA) 10 MG tablet, Take 1 tablet (10 mg total) by mouth daily., Disp: 90 tablet, Rfl: 3   losartan (COZAAR) 25 MG tablet, TAKE 1 TABLET(25 MG) BY MOUTH AT BEDTIME, Disp: 90 tablet, Rfl: 3   metFORMIN (GLUCOPHAGE-XR) 500 MG 24 hr tablet, Take 500 mg by mouth 2 (two) times daily., Disp: , Rfl:    metoprolol tartrate (LOPRESSOR) 25 MG tablet, Take 37.5 mg by mouth 2 (two) times daily., Disp: , Rfl:    oxyCODONE (OXY IR/ROXICODONE) 5 MG immediate release tablet, Take 1 tablet (5 mg total) by mouth every 4 (four) hours as needed for severe pain. For AFTER surgery only, do not take and drive, Disp: 10 tablet, Rfl: 0   senna-docusate (SENOKOT-S) 8.6-50 MG tablet, Take 2 tablets by mouth at bedtime. For AFTER surgery, do not take if having diarrhea, Disp: 30 tablet, Rfl: 0   sertraline (ZOLOFT) 50 MG tablet, Take 1 tablet (50 mg total) by mouth daily., Disp: 90 tablet, Rfl: 0   Vitamin D,  Ergocalciferol, (DRISDOL) 1.25 MG (50000 UNIT) CAPS capsule, Take 50,000 Units by mouth every 7 (seven) days., Disp: , Rfl:   ALLERGIES: Allergies  Allergen Reactions   Alprazolam Other (See Comments)    Caused mood changes   Citalopram Hydrobromide Other (See Comments)    Mood changes   Lisinopril Cough   Nitrofurantoin Monohyd Macro Nausea And Vomiting   Statins Other (See Comments)    MUSCLE LOSS - lipitor, zocor   Sulfa Drugs Cross Reactors Nausea And Vomiting    FAMILY HISTORY:  Family History  Problem Relation Age of Onset   Heart disease Mother    Stroke Mother    Hyperlipidemia Mother    Transient ischemic attack Mother    Hypertension Mother    Diabetes Mother    Depression Mother    Anxiety disorder Mother    Obesity Mother    Stroke Father    Transient ischemic attack Father    Sudden death Maternal Uncle    Heart disease Maternal Grandmother 26   Hyperlipidemia Maternal Grandmother    Hyperlipidemia Maternal Grandfather    Heart disease Maternal Grandfather    Ovarian cancer Paternal Grandmother    Stroke Paternal Grandfather    Hypertension Paternal Grandfather    Diabetes Paternal Grandfather    Colon cancer Neg Hx    Breast cancer Neg Hx    Endometrial cancer Neg Hx    Pancreatic cancer Neg Hx    Prostate cancer Neg Hx     SOCIAL HISTORY: Social History   Socioeconomic History   Marital status: Married    Spouse name: Not on file   Number of children: Not on file   Years of education: Not on file   Highest education level: Not on file  Occupational History   Occupation: Print production planner  Tobacco Use   Smoking status: Former    Current packs/day: 0.00    Types: Cigarettes    Start date: 09/14/1977    Quit date: 09/14/1997    Years since quitting: 25.7   Smokeless tobacco: Never  Vaping Use   Vaping status: Never Used  Substance and Sexual Activity   Alcohol use: Not Currently    Comment: h/o heavy use in 80s   Drug use: Not Currently     Comment: "most of them" - quit in 1994   Sexual activity: Not Currently    Partners: Male  Other Topics Concern   Not on file  Social History Narrative   Exercise-- none   Right handed   Social Determinants of Health   Financial Resource Strain: High Risk (10/04/2022)   Received from [facility], [facility]   Overall Financial Resource Strain (CARDIA)    Difficulty of Paying Living Expenses: Hard  Food Insecurity: No Food Insecurity (10/04/2022)   Received from [facility], [facility]   Hunger Vital Sign    Worried About Running Out of Food in the Last Year: Never true    Ran Out of Food in the Last Year: Never true  Transportation Needs: No Transportation Needs (10/04/2022)   Received from [employer], [facility]   PRAPARE - Transportation    Lack of Transportation (Medical): No    Lack of Transportation (Non-Medical): No  Physical Activity: Inactive (10/04/2022)   Received from [facility], [facility]   Exercise Vital Sign    Days of Exercise per Week: 0 days    Minutes of Exercise per Session: 0 min  Stress: Stress Concern Present (10/04/2022)   Received from [facility], [facility] - Occupational Stress Questionnaire    Feeling of Stress : Rather much  Social Connections: Socially Isolated (10/04/2022)   Received from [facility], [facility]   Social Network    How would you rate your social network (family, work, friends)?: Little participation, lonely and socially isolated  Intimate Partner Violence: Not At Risk (10/04/2022)   Received from [facility], [facility]   HITS    Over the last 12 months how often  did your partner physically hurt you?: 1    Over the last 12 months how often did your partner insult you or talk down to you?: 3    Over the last 12 months how often did your partner threaten you with physical harm?: 1    Over the last 12 months how often did your partner scream or curse at  you?: 2    REVIEW OF SYSTEMS: New patient intake form was reviewed.  Complete 10-system review is negative except for the following: Decreased concentration, pelvic pain, anxiety, muscle pain/cramps, itching  PHYSICAL EXAM: BP 130/63 (BP Location: Right Arm, Patient Position: Sitting) Comment: second bp taken.  Pulse 71   Temp 98.6 F (37 C) (Oral)   Resp 19   Ht 5\' 5"  (1.651 m)   Wt (!) 348 lb 4 oz (158 kg)   LMP 01/06/2012   SpO2 97%   BMI 57.95 kg/m  Constitutional: No acute distress. Neuro/Psych: Alert, oriented.  Head and Neck: Normocephalic, atraumatic. Neck symmetric without masses. Sclera anicteric.  Respiratory: Normal work of breathing. Clear to auscultation bilaterally. Scar on chest from CABG Cardiovascular: Regular rate and rhythm, rubs, or gallops. Systolic murmur Abdomen: Normoactive bowel sounds. Soft, non-distended, non-tender to palpation. No masses or appreciated.  Extremities: Grossly normal range of motion. Warm, well perfused. No edema bilaterally. Skin: No rashes or lesions. Lymphatic: No cervical, supraclavicular, or inguinal adenopathy. Genitourinary: External genitalia without lesions. Urethral meatus without lesions or prolapse. On speculum exam, vagina and cervix without lesions. Only can visualize anterior lip of cervix due to vaginal length, body habitus. Bimanual exam reveals normal cervix on palpation. Uterus, adnexal limited evaluation due to body habitus. Exam chaperoned by Warner Mccreedy, NP   LABORATORY AND RADIOLOGIC DATA: Outside medical records were reviewed to synthesize the above history, along with the history and physical obtained during the visit.  Outside laboratory, pathology, and imaging reports were reviewed, with pertinent results below.    WBC  Date Value Ref Range Status  07/01/2022 8.6 3.4 - 10.8 x10E3/uL Final  09/30/2021 10.9 (H) 4.0 - 10.5 K/uL Final   Hemoglobin  Date Value Ref Range Status  07/01/2022 13.0 11.1 - 15.9  g/dL Final   Hematocrit  Date Value Ref Range Status  07/01/2022 41.3 34.0 - 46.6 % Final   Platelets  Date Value Ref Range Status  07/01/2022 304 150 - 450 x10E3/uL Final   LDH  Date Value Ref Range Status  07/26/2020 284 (H) 98 - 192 U/L Final    Comment:    Performed at [facility], [address], [place], [place] [address]   Creat  Date Value Ref Range Status  11/25/2020 0.91 0.50 - 1.05 mg/dL Final    Comment:    For patients >68 years of age, the reference limit for Creatinine is approximately 13% higher for people identified as African-American. .    Creatinine, Ser  Date Value Ref Range Status  07/01/2022 0.57 0.57 - 1.00 mg/dL Final   AST  Date Value Ref Range Status  07/01/2022 20 0 - 40 IU/L Final   ALT  Date Value Ref Range Status  07/01/2022 23 0 - 32 IU/L Final    Surgical pathology (05/28/23): Diagnosis: Well-differentiated endometrial adenocarcinoma (FIGO I)  Pelvic ultrasound (06/07/23): Impression: Technically difficult study due to patient BMI There appears to be a post left intramural fibroid 2.1 x 2.1 x 1.8 cm. The endometrium appears focally thickened towards the fundus measuring 16.4 mm.  Cannot  rule out polyp versus other. Limited TAS was added to evaluate ovaries. The right ovary was not visualized. No free fluid.

## 2023-06-16 ENCOUNTER — Telehealth: Payer: Self-pay | Admitting: *Deleted

## 2023-06-16 NOTE — Telephone Encounter (Signed)
Per Dr Alvester Morin fax records and surgical optimization to the patient's cardiology office

## 2023-06-18 NOTE — Telephone Encounter (Signed)
Refax records to the patient's cardiology office

## 2023-06-22 NOTE — Telephone Encounter (Signed)
Spoke with Dr Levell July office regarding the patient's cardiology office. Patient has not be seen at that office in a year. Also that office doesn't prescribe her Plavix. Called and spoke with the patient regarding her Plavix. Per the patient, Dr Lionel December prescribes the Plavix. Fax records and surgical optimization to Dr Glean Salvo. Patient will call Dr Levell July to schedule a clearance appt and then call the office back with the appt date/time.

## 2023-06-22 NOTE — Patient Instructions (Addendum)
SURGICAL WAITING ROOM VISITATION Patients having surgery or a procedure may have no more than 2 support people in the waiting area - these visitors may rotate in the visitor waiting room.   Due to an increase in RSV and influenza rates and associated hospitalizations, children ages 58 and under may not visit patients in [facility] hospitals. If the patient needs to stay at the hospital during part of their recovery, the visitor guidelines for inpatient rooms apply.  PRE-OP VISITATION  Pre-op nurse will coordinate an appropriate time for 1 support person to accompany the patient in pre-op.  This support person may not rotate.  This visitor will be contacted when the time is appropriate for the visitor to come back in the pre-op area.  Please refer to the [facility] website for the visitor guidelines for Inpatients (after your surgery is over and you are in a regular room).  You are not required to quarantine at this time prior to your surgery. However, you must do this: Hand Hygiene often Do NOT share personal items Notify your provider if you are in close contact with someone who has COVID or you develop fever 100.4 or greater, new onset of sneezing, cough, sore throat, shortness of breath or body aches.  If you test positive for Covid or have been in contact with anyone that has tested positive in the last 10 days please notify you surgeon.    Your procedure is scheduled on:  TUESDAY  July 06, 2023  [facility]: Leota [facility]sen entrance where the [employer] is available.   Report to admitting at:   11:45  AM  Call this number if you have any questions or problems the morning of surgery [phone]  Eat a light diet the day before surgery.  Examples including soups, broths, toast, yogurt, mashed potatoes.  AVOID GAS PRODUCING FOODS. Things to avoid include carbonated beverages (fizzy beverages, sodas), raw fruits and raw vegetables (uncooked), or beans.   Do not eat food after Midnight the night prior to your surgery/procedure.  After Midnight you may have the following liquids until  11:00 AM DAY OF SURGERY  Clear Liquid Diet Water Black Coffee (sugar ok, NO MILK/CREAM OR CREAMERS)  Tea (sugar ok, NO MILK/CREAM OR CREAMERS) regular and decaf                             Plain Jell-O  with no fruit (NO RED)                                           Fruit ices (not with fruit pulp, NO RED)                                     Popsicles (NO RED)                                                                  Juice: NO CITRUS JUICES: only apple, WHITE grape, WHITE cranberry Sports drinks like Gatorade or Powerade (  NO RED)              FOLLOW ANY ADDITIONAL PRE OP INSTRUCTIONS YOU RECEIVED FROM YOUR SURGEON'S OFFICE!!!   Oral Hygiene is also important to reduce your risk of infection.        Remember - BRUSH YOUR TEETH THE MORNING OF SURGERY WITH YOUR REGULAR TOOTHPASTE  Do NOT smoke after Midnight the night before surgery.  STOP TAKING all Vitamins, Herbs and supplements 1 week before your surgery.   PLAVIX- STOP TAKING 5 DAYS PRIOR TO SURGERY:  Last Dose will be taken on Wednesday 06-30-2023  ASPIRIN-  follow Cardiology instructions.   METFORMIN- Day BEFORE surgery take as usual.                        DAY OF SURGERY: DO NOT TAKE METFORMIN.  Take ONLY these medicines the morning of surgery with A SIP OF WATER:sertraline (Zoloft), ezetimibe (Zetia), metoprolol, and Tylenol if needed for pain.      If You have been diagnosed with Sleep Apnea - Bring CPAP mask and tubing day of surgery. We will provide you with a CPAP machine on the day of your surgery.                   You may not have any metal on your body including hair pins, jewelry, and body piercing  Do not wear make-up, lotions, powders, perfumes or deodorant  Do not wear nail polish including gel and S&S, artificial / acrylic nails, or any other type of covering on natural  nails including finger and toenails. If you have artificial nails, gel coating, etc., that needs to be removed by a nail salon, Please have this removed prior to surgery. Not doing so may mean that your surgery could be cancelled or delayed if the Surgeon or anesthesia staff feels like they are unable to monitor you safely.   Do not shave 48 hours prior to surgery to avoid nicks in your skin which may contribute to postoperative infections.   Contacts, Hearing Aids, dentures or bridgework may not be worn into surgery. DENTURES WILL BE REMOVED PRIOR TO SURGERY PLEASE DO NOT APPLY "Poly grip" OR ADHESIVES!!!  You may bring a small overnight bag with you on the day of surgery, only pack items that are not valuable. [place] IS NOT RESPONSIBLE   FOR VALUABLES THAT ARE LOST OR STOLEN.   Patients discharged on the day of surgery will not be allowed to drive home.  Someone NEEDS to stay with you for the first 24 hours after anesthesia.  Do not bring your home medications to the hospital. The Pharmacy will dispense medications listed on your medication list to you during your admission in the Hospital.   Please read over the following fact sheets you were given: IF YOU HAVE QUESTIONS ABOUT YOUR PRE-OP INSTRUCTIONS, PLEASE CALL [phone]   [facility] - Preparing for Surgery Before surgery, you can play an important role.  Because skin is not sterile, your skin needs to be as free of germs as possible.  You can reduce the number of germs on your skin by washing with CHG (chlorahexidine gluconate) soap before surgery.  CHG is an antiseptic cleaner which kills germs and bonds with the skin to continue killing germs even after washing. Please DO NOT use if you have an allergy to CHG or antibacterial soaps.  If your skin becomes reddened/irritated stop using the CHG and inform your  nurse when you arrive at Short Stay. Do not shave (including legs and underarms) for at least 48 hours prior to the first  CHG shower.  You may shave your face/neck.  Please follow these instructions carefully:  1.  Shower with CHG Soap the night before surgery and the  morning of surgery.  2.  If you choose to wash your hair, wash your hair first as usual with your normal  shampoo.  3.  After you shampoo, rinse your hair and body thoroughly to remove the shampoo.                             4.  Use CHG as you would any other liquid soap.  You can apply chg directly to the skin and wash.  Gently with a scrungie or clean washcloth.  5.  Apply the CHG Soap to your body ONLY FROM THE NECK DOWN.   Do not use on face/ open                           Wound or open sores. Avoid contact with eyes, ears mouth and genitals (private parts).                       Wash face,  Genitals (private parts) with your normal soap.             6.  Wash thoroughly, paying special attention to the area where your  surgery  will be performed.  7.  Thoroughly rinse your body with warm water from the neck down.  8.  DO NOT shower/wash with your normal soap after using and rinsing off the CHG Soap.            9.  Pat yourself dry with a clean towel.            10.  Wear clean pajamas.            11.  Place clean sheets on your bed the night of your first shower and do not  sleep with pets.  ON THE DAY OF SURGERY : Do not apply any lotions/deodorants the morning of surgery.  Please wear clean clothes to the hospital/surgery center.     FAILURE TO FOLLOW THESE INSTRUCTIONS MAY RESULT IN THE CANCELLATION OF YOUR SURGERY  PATIENT SIGNATURE_________________________________  NURSE SIGNATURE__________________________________  ________________________________________________________________________

## 2023-06-22 NOTE — Progress Notes (Signed)
COVID Vaccine received:  [x]  No []  Yes Date of any COVID positive Test in last 90 days:  None  PCP - Delbert Harness, MD at [facility]  [phone]  [phone] (Fax)   Cardiologist - Alverda Skeans, MD -has clearance appt scheduled for Friday 06-25-2023 Cardiothoracic- Dr. Duanne Limerick  Neurology- Shon Millet, DO  Chest x-ray - 09-30-2021  Epic EKG -  (12-05-2021 Epic)  Stress Test -  ECHO -  Cardiac Cath - [facility] 12-05-2021  Dr. Lynnette Caffey CABG- 01-12-2022    PCR screen: []  Ordered & Completed [x]   No Order but Needs PROFEND     []   N/A for this surgery  Surgery Plan:  []  Ambulatory   []  Outpatient in bed  []  Admit Anesthesia:    [x]  General  []  Spinal  []   Choice []   MAC  Bowel Prep - []  No  [x]   Yes _Gyn non-gassy foods diet_  Pacemaker / ICD device [x]  No []  Yes   Spinal Cord Stimulator:[x]  No []  Yes       History of Sleep Apnea? [x]  No []  Yes   CPAP used?- [x]  No []  Yes    Does the patient monitor blood sugar?   []  N/A   []  No [x]  Yes  Patient has: []  NO Hx DM   []  Pre-DM   []  DM1  [x]   DM2 Last A1c was: 7.5  on 04-09-2023  in [facility]     Does patient have a [employer] or Dexacom? []  No [x]  Yes   Sensor reading at  PST: 130    CBG was 135 at PST  Fasting Blood Sugar Ranges- 130-150 Checks Blood Sugar continuous  times a day  Other Diabetic medications/ instructions: Metformin 500mg  Bid,   Hold DOS  Blood Thinner / Instructions:Plavix  Hold x 5 days  Pending cardiac clearance and approval on 06-25-2023 Aspirin Instructions:  ASA 81 mg  ? Hold pending cardiac clearance,   patient aware.  ERAS Protocol Ordered: []  No  [x]  Yes PRE-SURGERY []  ENSURE  []  G2   [x]  No Drink Ordered Patient is to be NPO after: 11:00  Dental hx: []  Dentures:  [x]  N/A      []  Bridge or Partial:                   []  Loose or Damaged teeth:   Comments: Sees Dr. Rennis Golden at [facility] -Lipid clinic and is on REPATHA injections q 14 days. Sent msg to Warner Mccreedy, NP to see if this needs to be Held.  Last  Dose: Patient will not inject the week prior to surgery (hasn't had injection since mid September- she forgets to inject)   Activity level: Patient is unable to climb a flight of stairs without difficulty; [x]  No CP   but would have _SOB  Patient can perform ADLs without assistance.   Anesthesia review: CAD- CABG x 4 on 01-12-2022 ([facility]), Hx TIA, DM2, anxiety  Patient denies shortness of breath, fever, cough and chest pain at PAT appointment.  Patient verbalized understanding and agreement to the Pre-Surgical Instructions that were given to them at this PAT appointment. Patient was also educated of the need to review these PAT instructions again prior to his/her surgery.I reviewed the appropriate phone numbers to call if they have any and questions or concerns.

## 2023-06-23 ENCOUNTER — Encounter (HOSPITAL_COMMUNITY)
Admission: RE | Admit: 2023-06-23 | Discharge: 2023-06-23 | Disposition: A | Discharge status: Home / Self Care | Payer: BC Managed Care – PPO | Source: Ambulatory Visit | Attending: Psychiatry | Admitting: Psychiatry

## 2023-06-23 ENCOUNTER — Telehealth: Payer: Self-pay | Admitting: *Deleted

## 2023-06-23 ENCOUNTER — Telehealth: Payer: Self-pay | Admitting: Internal Medicine

## 2023-06-23 ENCOUNTER — Other Ambulatory Visit: Payer: Self-pay

## 2023-06-23 ENCOUNTER — Encounter (HOSPITAL_COMMUNITY): Payer: Self-pay

## 2023-06-23 VITALS — BP 145/72 | HR 81 | Temp 99.0°F | Resp 22 | Ht 65.0 in | Wt 346.0 lb

## 2023-06-23 DIAGNOSIS — C541 Malignant neoplasm of endometrium: Secondary | ICD-10-CM | POA: Insufficient documentation

## 2023-06-23 DIAGNOSIS — E1165 Type 2 diabetes mellitus with hyperglycemia: Secondary | ICD-10-CM | POA: Insufficient documentation

## 2023-06-23 DIAGNOSIS — Z01818 Encounter for other preprocedural examination: Secondary | ICD-10-CM | POA: Insufficient documentation

## 2023-06-23 DIAGNOSIS — Z951 Presence of aortocoronary bypass graft: Secondary | ICD-10-CM

## 2023-06-23 DIAGNOSIS — I2510 Atherosclerotic heart disease of native coronary artery without angina pectoris: Secondary | ICD-10-CM

## 2023-06-23 HISTORY — DX: Atherosclerotic heart disease of native coronary artery without angina pectoris: I25.10

## 2023-06-23 HISTORY — DX: Unspecified osteoarthritis, unspecified site: M19.90

## 2023-06-23 LAB — HEMOGLOBIN A1C
Hgb A1c MFr Bld: 7.3 % — ABNORMAL HIGH (ref 4.8–5.6)
Mean Plasma Glucose: 162.81 mg/dL

## 2023-06-23 LAB — CBC
HCT: 41.3 % (ref 36.0–46.0)
Hemoglobin: 13.1 g/dL (ref 12.0–15.0)
MCH: 28.4 pg (ref 26.0–34.0)
MCHC: 31.7 g/dL (ref 30.0–36.0)
MCV: 89.6 fL (ref 80.0–100.0)
Platelets: 301 10*3/uL (ref 150–400)
RBC: 4.61 MIL/uL (ref 3.87–5.11)
RDW: 14.4 % (ref 11.5–15.5)
WBC: 10.3 10*3/uL (ref 4.0–10.5)
nRBC: 0 % (ref 0.0–0.2)

## 2023-06-23 LAB — COMPREHENSIVE METABOLIC PANEL
ALT: 28 U/L (ref 0–44)
AST: 31 U/L (ref 15–41)
Albumin: 3.9 g/dL (ref 3.5–5.0)
Alkaline Phosphatase: 60 U/L (ref 38–126)
Anion gap: 9 (ref 5–15)
BUN: 15 mg/dL (ref 6–20)
CO2: 24 mmol/L (ref 22–32)
Calcium: 9 mg/dL (ref 8.9–10.3)
Chloride: 105 mmol/L (ref 98–111)
Creatinine, Ser: 0.57 mg/dL (ref 0.44–1.00)
GFR, Estimated: >60 mL/min (ref >60)
Glucose, Bld: 98 mg/dL (ref 70–99)
Potassium: 4.4 mmol/L (ref 3.5–5.1)
Sodium: 138 mmol/L (ref 135–145)
Total Bilirubin: 0.6 mg/dL (ref 0.3–1.2)
Total Protein: 8 g/dL (ref 6.5–8.1)

## 2023-06-23 LAB — TYPE AND SCREEN
ABO/RH(D): O POS
Antibody Screen: NEGATIVE

## 2023-06-23 LAB — GLUCOSE, CAPILLARY: Glucose-Capillary: 135 mg/dL — ABNORMAL HIGH (ref 70–99)

## 2023-06-23 NOTE — Telephone Encounter (Signed)
Called and spoke w patient.  She is having a gynecologic surgery scheduled already for 07/06/23.  She takes Plavix which she said is prescribed by neurology for carotid artery disease.   She is overdue for a visit with Dr. Lynnette Caffey.  I have scheduled her for this Friday am.   Pre op team is working on obtaining clearance request, but there is specific surgery plan already in chart.

## 2023-06-23 NOTE — Telephone Encounter (Signed)
Pt states she needs the doctors sign off for her to have a procedure done because she was just diagnosed with cancer. Please advise

## 2023-06-23 NOTE — Progress Notes (Signed)
Cardiology Office Note:   Date:  06/25/2023  ID:  Penny Huerta, DOB March 21, 1966, MRN [id number] PCP:  [name], MD  [facility] Providers Cardiologist:  Alverda Skeans, MD Referring MD: [name], MD  Chief Complaint/Reason for Referral: Cardiology follow-up ASSESSMENT:    1. Preoperative cardiovascular examination   2. S/P CABG x 4   3. Type 2 diabetes mellitus with complication, without long-term current use of insulin (HCC)   4. Hypertension associated with diabetes (HCC)   5. Hyperlipidemia associated with type 2 diabetes mellitus (HCC)   6. BMI 50.0-59.9, adult (HCC)   7. Bilateral carotid artery stenosis     PLAN:   In order of problems listed above: Preoperative cardiovascular examination: The patient can do 4 METS of activity without chest discomfort.  She is at low risk for perioperative cardiovascular complication for her laparoscopic hysterectomy.  Will obtain an echocardiogram since she has not yet had one since her surgery.  If it shows normal LV function she is at low risk for perioperative cardiovascular issues. History of CABG: Continue aspirin, Repatha, Zetia; prescribed as needed nitroglycerin.  Can stop Plavix now. Type 2 diabetes mellitus: Continue losartan, aspirin, Repatha, and start Jardiance 10 mg daily.  Refer to pharmacy for GLP-1 receptor agonist.  Because of her upcoming gynecologic surgery we will have the patient see if pharmacy in December. Hypertension: Increase losartan to 50 mg and check BMP in 1 week. Hyperlipidemia: Check lipid panel and LP(a) in December.  She will need to stop her Repatha around the time of surgery.  Her surgery is next week.. Elevated BMI: Refer to pharmacy as detailed above. Bilateral carotid disease: Continue aspirin monotherapy, blood pressure control, and lipid control.  Stop Plavix.  Refer to vascular surgery for recommendations and surveillance.            Dispo:  Return in about 6 months (around 12/24/2023).       Medication Adjustments/Labs and Tests Ordered: Current medicines are reviewed at length with the patient today.  Concerns regarding medicines are outlined above.  The following changes have been made:     Labs/tests ordered: Orders Placed This Encounter  Procedures   Basic Metabolic Panel (BMET)   Lipid Profile   AMB Referral to [facility]   Ambulatory referral to Vascular Surgery   ECHOCARDIOGRAM COMPLETE    Medication Changes: Meds ordered this encounter  Medications   losartan (COZAAR) 50 MG tablet    Sig: Take 1 tablet (50 mg total) by mouth daily.    Dispense:  90 tablet    Refill:  3    Dose increase    Current medicines are reviewed at length with the patient today.  The patient does not have concerns regarding medicines.  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 Rennis Golden: low Lp(a) BMI 51 Peripheral vascular disease with advanced intracranial atherosclerosis and 70% stenosis of left ICA and severe stenosis of right ICA Cough due to ACE inhibitor  March 2023: The patient returns for follow-up.  She was initially seen in January 2023 due to chest pain.  She ultimately underwent cardiac catheterization which demonstrated severe multivessel disease.  Due to  her body habitus and carotid disease she was not felt to be a candidate for surgical revascularization at [facility].  She ultimately sought second opinion at [facility] and underwent uncomplicated multivessel CABG in early May 2023.  She also saw neurosurgery there regarding her carotid stenosis with plans to repeat a carotid ultrasound and pursue surgical revascularization if  indicated.  July 2023: The patient is doing well.  She is participating in cardiac rehabilitation.  She has plans to have her left carotid disease treated with a stent.  She feels like she is getting stronger.  She has not been using her Repatha.  She denies any significant chest pain, palpitations, paroxysmal nocturnal dyspnea, orthopnea, or shortness of breath.  She feels very well.  She does report a cough and thinks it may be due to lisinopril.  Plan: Continue Plavix for 6 months and then aspirin monotherapy; stop lisinopril due to cough and start losartan; refer to pharmacy given elevated BMI.  October 2024: The patient is here due to preoperative recommendations for gynecologic surgery.  The patient did not follow-up as requested.  Another one of her doctor stopped her Jardiance and she is not exactly sure why.  Following her CABG surgery she was participating in cardiac rehabilitation and was feeling quite well.  Unfortunately her husband developed some complex medical problems and he needed a lot of care from her.  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  She is able to walk and go up stairs without chest pain.  She does get short of breath mostly from deconditioning.  Her weight is up about 20 pounds since I saw her last.  She has had no signs or symptoms of stroke.  She did develop abnormal uterine bleeding and was unfortunately diagnosed with uterine adenocarcinoma.  She is scheduled for a total hysterectomy.  For this reason she was seen today for recommendations regarding her dual antiplatelet therapy.  She had been placed on Plavix by neurosurgeon some years ago with no definite plan.  She has not seen this provider in some time.  In terms of other medications she was off Repatha for some time due to cost issues.  She is back on it.  She did have a lipid panel drawn when she was not taking it in June which showed an LDL of 181.          Current  Medications: Current Meds  Medication Sig   acetaminophen (TYLENOL) 500 MG tablet Take 1,000 mg by mouth every 6 (six) hours as needed for mild pain.   aspirin EC 81 MG tablet Take 81 mg by mouth daily. Swallow whole.   Evolocumab (REPATHA SURECLICK) 140 MG/ML SOAJ INJECT 1 DOSE INTO THE SKIN EVERY 14 DAYS   ezetimibe (ZETIA) 10 MG tablet Take 1 tablet (10 mg total) by mouth daily.   losartan (COZAAR) 50 MG tablet Take 1 tablet (50 mg total) by mouth daily.   metFORMIN (GLUCOPHAGE-XR) 500 MG 24 hr tablet Take 500 mg by mouth 2 (two) times daily.   metoprolol tartrate (LOPRESSOR) 25 MG tablet Take 37.5 mg by mouth 2 (two) times daily.   OVER THE COUNTER MEDICATION Take 1 capsule by mouth 2 (two) times daily. Doterra Tri-Ease (for allergies)   sertraline (ZOLOFT) 50 MG tablet Take 1 tablet (50 mg total) by mouth daily.   Vitamin D, Ergocalciferol, (DRISDOL) 1.25 MG (50000 UNIT) CAPS capsule Take 50,000  Units by mouth every 7 (seven) days.   [DISCONTINUED] clopidogrel (PLAVIX) 75 MG tablet Take 75 mg by mouth daily.   [DISCONTINUED] losartan (COZAAR) 25 MG tablet TAKE 1 TABLET(25 MG) BY MOUTH AT BEDTIME     Review of Systems:   Please see the history of present illness.    All other systems reviewed and are negative.     EKGs/Labs/Other Test Reviewed:   EKG: EKG that I personally reviewed from October 2024 demonstrates sinus rhythm  EKG Interpretation Date/Time:    Ventricular Rate:    PR Interval:    QRS Duration:    QT Interval:    QTC Calculation:   R Axis:      Text Interpretation:           Risk Assessment/Calculations:          Physical Exam:   VS:  BP (!) 150/90   Pulse 68   Ht 5\' 5"  (1.651 m)   Wt (!) 349 lb (158.3 kg)   LMP 01/06/2012   BMI 58.08 kg/m    HYPERTENSION CONTROL Vitals:   06/25/23 0950 06/25/23 1043  BP: (!) 152/90 (!) 150/90    The patient's blood pressure is elevated above target today.  In order to address the patient's elevated BP: A  current anti-hypertensive medication was adjusted today.      Wt Readings from Last 3 Encounters:  06/25/23 (!) 349 lb (158.3 kg)  06/23/23 (!) 346 lb (156.9 kg)  06/14/23 (!) 348 lb 4 oz (158 kg)      GENERAL:  No apparent distress, AOx3 HEENT:  Bilateral carotid bruits, +2 carotid impulses, no scleral icterus CAR: RRR no murmurs, gallops, rubs, or thrills RES:  Clear to auscultation bilaterally ABD:  Soft, nontender, nondistended, positive bowel sounds x 4 VASC:  +2 radial pulses, +2 carotid pulses NEURO:  CN 2-12 grossly intact; motor and sensory grossly intact PSYCH:  No active depression or anxiety EXT:  No edema, ecchymosis, or cyanosis  Signed, Orbie Pyo, MD  06/25/2023 11:05 AM    [facility] [address] [place], [place], [place]  [address] Phone: [phone]; Fax: [phone]   Note:  This document was prepared using Dragon voice recognition software and may include unintentional dictation errors.

## 2023-06-23 NOTE — Telephone Encounter (Signed)
Pre-operative Risk Assessment    Patient Name: Penny Huerta  DOB: [birth year] MRN: [id number]   DATE OF LAST VISIT:  03/05/23 DR. HILTY (LIPID CLINIC); 04/03/22 DR. THUKKANI DATE OF NEXT VISIT: 06/25/23 DR. [facility]  Request for Surgical Clearance    Procedure:   XI ROBOTIC ASSISTED TOTAL HYSTERECTOMY WITH BILATERAL SALPINGO OOPHORECTOMY,  SENTINEL NODE Bx, POSSIBLE LYMPH NODE DISSECTION, POSSIBLE D&C, POSSIBLE MIRENA INTRAUTERINE DEVICE (UD) INSERTION  Date of Surgery:  Clearance 07/06/23                                 Surgeon:  DR. Clide Cliff Surgeon's Group or Practice Name:  [facility] Phone number:  [phone] Fax number:  [phone]   Type of Clearance Requested:   - Medical  - Pharmacy:  Hold Clopidogrel (Plavix) ; PLAVIX LOOKS TO BE MANAGED BY DR. HILTY FOR LIPIDS?    Type of Anesthesia:  General    Additional requests/questions:    Elpidio Anis   06/23/2023, 12:30 PM

## 2023-06-23 NOTE — Telephone Encounter (Signed)
Spoke with Okey Regal from [facility] in regards to Ms. Ottley's medical clearance for her scheduled surgery on October 22nd. Patient has an appointment with Cardiologist Dr. Thad Ranger this Friday, October 11 th. Confirmed our office fax # [phone]. Once patient's visit is complete, clearance will be faxed to office from Dr.Taukkani.

## 2023-06-25 ENCOUNTER — Ambulatory Visit: Payer: BC Managed Care – PPO | Attending: Internal Medicine | Admitting: Internal Medicine

## 2023-06-25 ENCOUNTER — Encounter: Payer: Self-pay | Admitting: Internal Medicine

## 2023-06-25 VITALS — BP 150/90 | HR 68 | Ht 65.0 in | Wt 349.0 lb

## 2023-06-25 DIAGNOSIS — Z6841 Body Mass Index (BMI) 40.0 and over, adult: Secondary | ICD-10-CM

## 2023-06-25 DIAGNOSIS — E1159 Type 2 diabetes mellitus with other circulatory complications: Secondary | ICD-10-CM

## 2023-06-25 DIAGNOSIS — I152 Hypertension secondary to endocrine disorders: Secondary | ICD-10-CM

## 2023-06-25 DIAGNOSIS — E1169 Type 2 diabetes mellitus with other specified complication: Secondary | ICD-10-CM

## 2023-06-25 DIAGNOSIS — Z951 Presence of aortocoronary bypass graft: Secondary | ICD-10-CM | POA: Diagnosis not present

## 2023-06-25 DIAGNOSIS — Z01810 Encounter for preprocedural cardiovascular examination: Secondary | ICD-10-CM | POA: Diagnosis not present

## 2023-06-25 DIAGNOSIS — I6523 Occlusion and stenosis of bilateral carotid arteries: Secondary | ICD-10-CM

## 2023-06-25 DIAGNOSIS — E785 Hyperlipidemia, unspecified: Secondary | ICD-10-CM

## 2023-06-25 DIAGNOSIS — E118 Type 2 diabetes mellitus with unspecified complications: Secondary | ICD-10-CM | POA: Diagnosis not present

## 2023-06-25 MED ORDER — LOSARTAN POTASSIUM 50 MG PO TABS: 50.0000 mg | ORAL_TABLET | Freq: Every day | ORAL | 3 refills | Status: DC

## 2023-06-25 NOTE — Patient Instructions (Addendum)
Medication Instructions:  Your physician has recommended you make the following change in your medication:  1.) stop Plavix 2.) increase losartan to 50 mg - one tablet daily  *If you need a refill on your cardiac medications before your next appointment, please call your pharmacy*   Lab Work: Return 07/05/23 for bmet Return in 3 months for lipid panel  If you have labs (blood work) drawn today and your tests are completely normal, you will receive your results only by: MyChart Message (if you have MyChart) OR A paper copy in the mail If you have any lab test that is abnormal or we need to change your treatment, we will call you to review the results.   Testing/Procedures:check locations - needs asap for pre op clearance Your physician has requested that you have an echocardiogram. Echocardiography is a painless test that uses sound waves to create images of your heart. It provides your doctor with information about the size and shape of your heart and how well your heart's chambers and valves are working. This procedure takes approximately one hour. There are no restrictions for this procedure. Please do NOT wear cologne, perfume, aftershave, or lotions (deodorant is allowed). Please arrive 15 minutes prior to your appointment time.    Follow-Up: At [facility], you and your health needs are our priority.  As part of our continuing mission to provide you with exceptional heart care, we have created designated Provider Care Teams.  These Care Teams include your primary Cardiologist (physician) and Advanced Practice Providers (APPs -  Physician Assistants and Nurse Practitioners) who all work together to provide you with the care you need, when you need it.   Your next appointment:   6 month(s)  Provider:   Orbie Pyo, MD     Other Instructions You have been referred to PharmD (clinical pharmacy team) for weight management You have been referred to Vascular Surgery  for surveillance of carotid artery stenosis

## 2023-06-28 ENCOUNTER — Telehealth: Payer: Self-pay | Admitting: *Deleted

## 2023-06-28 NOTE — Telephone Encounter (Signed)
Spoke with Penny Huerta and relayed to patient she can stop taking her plavix today. Pt advised not to start back up on plavix until after surgery and instructed by provider. Pt advised also she can continue to take her aspirin until the morning of surgery and then stop the aspirin at that time. Pt verbalized understanding and had no concerns or questions at this time.

## 2023-06-30 ENCOUNTER — Encounter: Payer: Self-pay | Admitting: Obstetrics & Gynecology

## 2023-06-30 NOTE — Progress Notes (Addendum)
Anesthesia Chart Review   Case: [id number] Date/Time: 07/06/23 1345   Procedures:      XI ROBOTIC ASSISTED TOTAL HYSTERECTOMY WITH BILATERAL SALPINGO OOPHORECTOMY     SENTINEL NODE BIOPSY     POSSIBLE LYMPH NODE DISSECTION     POSSIBLE DILATATION AND CURETTAGE     POSSIBLE MIRENA INTRAUTERINE DEVICE (IUD) INSERTION   Anesthesia type: General   Pre-op diagnosis: ENDOMETRIAL CANCER   Location: [facility] ROOM 05 / [facility]   Surgeons: Clide Cliff, MD       DISCUSSION:57 y.o. former smoker with h/o HTN, bilateral carotid stenosis, CAD s/p CABG x4, atrial fibrillation, Stroke, DM II, endometrial cancer scheduled for above procedure 07/06/23 with Dr. Clide Cliff.   Pt last seen by cardiology 06/25/23. Per OV note, "Preoperative cardiovascular examination: The patient can do 4 METS of activity without chest discomfort.  She is at low risk for perioperative cardiovascular complication for her laparoscopic hysterectomy.  Will obtain an echocardiogram since she has not yet had one since her surgery.  If it shows normal LV function she is at low risk for perioperative cardiovascular issues."  Echo scheduled for 07/02/23.   Addendum 07/02/2023: Echo with EF 60-65%, mild aortic stenosis with mean gradient 15.5 mmHg, valve area 1.64 cm2. Per cardio comments on results, "Let her surgeon know that she is at low risk for perioperative cardiovascular complications."  VS: BP (!) 145/72 Comment: right arm sitting  Pulse 81   Temp 37.2 C (Oral)   Resp (!) 22   Ht 5\' 5"  (1.651 m)   Wt (!) 156.9 kg   LMP 01/06/2012   SpO2 95%   BMI 57.58 kg/m   PROVIDERS: [name], MD is PCP   Cardiologist:  Alverda Skeans, MD  LABS: Labs reviewed: Acceptable for surgery. (all labs ordered are listed, but only abnormal results are displayed)  Labs Reviewed  HEMOGLOBIN A1C - Abnormal; Notable for the following components:      Result Value   Hgb A1c MFr Bld 7.3 (*)    All other components within normal  limits  GLUCOSE, CAPILLARY - Abnormal; Notable for the following components:   Glucose-Capillary 135 (*)    All other components within normal limits  CBC  COMPREHENSIVE METABOLIC PANEL  TYPE AND SCREEN     IMAGES:   EKG:   CV: Echo 10/14/21  1. Left ventricular ejection fraction, by estimation, is 60 to 65%. The  left ventricle has normal function. The left ventricle has no regional  wall motion abnormalities. There is mild concentric left ventricular  hypertrophy. Left ventricular diastolic  parameters are indeterminate. The average left ventricular global  longitudinal strain is -17.0 %. The global longitudinal strain is normal.   2. Right ventricular systolic function is normal. The right ventricular  size is normal. There is normal pulmonary artery systolic pressure.   3. Left atrial size was mild to moderately dilated.   4. The mitral valve is grossly normal. Trivial mitral valve  regurgitation. No evidence of mitral stenosis.   5. The aortic valve is grossly normal. There is mild calcification of the  aortic valve. There is mild thickening of the aortic valve. Aortic valve  regurgitation is not visualized. Mild aortic valve stenosis.   6. The inferior vena cava is normal in size with greater than 50%  respiratory variability, suggesting right atrial pressure of 3 mmHg.   Comparison(s): No significant change from prior study.  Past Medical History:  Diagnosis Date   Allergy  Anxiety    Arthritis    Atrial fibrillation (HCC)    B12 deficiency    Carotid stenosis    Chest pain    Coronary artery disease    Diabetes mellitus without complication (HCC)    Family history of hyperlipidemia    Heart murmur    ARRHYTHMIA   History of drug use    Hx of CABG    Hyperlipidemia    Hypertension    Obesity    SOB (shortness of breath)    Stroke [facility])     Past Surgical History:  Procedure Laterality Date   CORONARY ARTERY BYPASS GRAFT  01/31/2022   x 4    DILATION AND CURETTAGE OF UTERUS     HYSTEROSCOPY W/ ENDOMETRIAL ABLATION  09/14/2009   x2   LEFT HEART CATH AND CORONARY ANGIOGRAPHY N/A 12/05/2021   Procedure: LEFT HEART CATH AND CORONARY ANGIOGRAPHY;  Surgeon: Orbie Pyo, MD;  Location: [facility];  Service: Cardiovascular;  Laterality: N/A;    MEDICATIONS:  acetaminophen (TYLENOL) 500 MG tablet   aspirin EC 81 MG tablet   Evolocumab (REPATHA SURECLICK) 140 MG/ML SOAJ   ezetimibe (ZETIA) 10 MG tablet   losartan (COZAAR) 50 MG tablet   metFORMIN (GLUCOPHAGE-XR) 500 MG 24 hr tablet   metoprolol tartrate (LOPRESSOR) 25 MG tablet   OVER THE COUNTER MEDICATION   oxyCODONE (OXY IR/ROXICODONE) 5 MG immediate release tablet   senna-docusate (SENOKOT-S) 8.6-50 MG tablet   sertraline (ZOLOFT) 50 MG tablet   Vitamin D, Ergocalciferol, (DRISDOL) 1.25 MG (50000 UNIT) CAPS capsule   No current facility-administered medications for this encounter.     Jodell Cipro Ward, PA-C [facility]Surgical Testing [phone]

## 2023-07-02 ENCOUNTER — Ambulatory Visit (HOSPITAL_COMMUNITY)
Admission: RE | Admit: 2023-07-02 | Discharge: 2023-07-02 | Disposition: A | Discharge status: Home / Self Care | Payer: BC Managed Care – PPO | Source: Ambulatory Visit | Attending: Family Medicine | Admitting: Family Medicine

## 2023-07-02 ENCOUNTER — Ambulatory Visit: Payer: BC Managed Care – PPO

## 2023-07-02 DIAGNOSIS — I119 Hypertensive heart disease without heart failure: Secondary | ICD-10-CM | POA: Diagnosis not present

## 2023-07-02 DIAGNOSIS — I082 Rheumatic disorders of both aortic and tricuspid valves: Secondary | ICD-10-CM | POA: Insufficient documentation

## 2023-07-02 DIAGNOSIS — Z8673 Personal history of transient ischemic attack (TIA), and cerebral infarction without residual deficits: Secondary | ICD-10-CM | POA: Diagnosis not present

## 2023-07-02 DIAGNOSIS — Z951 Presence of aortocoronary bypass graft: Secondary | ICD-10-CM

## 2023-07-02 DIAGNOSIS — I2510 Atherosclerotic heart disease of native coronary artery without angina pectoris: Secondary | ICD-10-CM | POA: Insufficient documentation

## 2023-07-02 DIAGNOSIS — I152 Hypertension secondary to endocrine disorders: Secondary | ICD-10-CM

## 2023-07-02 DIAGNOSIS — Z01810 Encounter for preprocedural cardiovascular examination: Secondary | ICD-10-CM

## 2023-07-02 DIAGNOSIS — E785 Hyperlipidemia, unspecified: Secondary | ICD-10-CM | POA: Insufficient documentation

## 2023-07-02 LAB — ECHOCARDIOGRAM COMPLETE
AR max vel: 1.53 cm2
AV Area VTI: 1.64 cm2
AV Area mean vel: 1.55 cm2
AV Mean grad: 15.5 mm[Hg]
AV Peak grad: 27.2 mm[Hg]
Ao pk vel: 2.61 m/s
Area-P 1/2: 3.02 cm2
MV VTI: 2.31 cm2
S' Lateral: 3.3 cm

## 2023-07-02 NOTE — Anesthesia Preprocedure Evaluation (Signed)
Anesthesia Evaluation  Patient identified by MRN, date of birth, ID band Patient awake    Reviewed: Allergy & Precautions, NPO status , Patient's Chart, lab work & pertinent test results  Airway Mallampati: III  TM Distance: >3 FB Neck ROM: Full    Dental  (+) Dental Advisory Given, Teeth Intact   Pulmonary former smoker   Pulmonary exam normal breath sounds clear to auscultation       Cardiovascular hypertension, Pt. on medications + CAD and + CABG  + Valvular Problems/Murmurs AS  Rhythm:Regular Rate:Normal + Systolic murmurs Echo 06/2023  1. Left ventricular ejection fraction, by estimation, is 60 to 65%. The left ventricle has normal function. The left ventricle has no regional wall motion abnormalities. There is mild concentric left ventricular hypertrophy. Left ventricular diastolic parameters were normal.   2. Right ventricular systolic function is normal. The right ventricular size is normal. Tricuspid regurgitation signal is inadequate for assessing PA pressure.   3. The mitral valve is grossly normal. Trivial mitral valve regurgitation. No evidence of mitral stenosis.   4. The aortic valve was not well visualized. Aortic valve regurgitation is not visualized. Mild aortic valve stenosis. Aortic valve area, by VTI measures 1.64 cm. Aortic valve mean gradient measures 15.5 mmHg. Aortic valve Vmax measures 2.61 m/s.   Comparison(s): No significant change from prior study.    Cath 11/2021   Ost RCA lesion is 70% stenosed.   Mid RCA lesion is 60% stenosed.   RPDA lesion is 95% stenosed.   2nd RPL lesion is 99% stenosed.   Prox LAD lesion is 80% stenosed.   LV end diastolic pressure is moderately elevated.   The left ventricular ejection fraction is 55-65% by visual estimate.   1.  Multivessel coronary artery disease consisting of ostial right coronary artery, mid right coronary artery, right PDA, R PLV, and mid LAD  disease.  The patient will be referred to cardiothoracic surgery for further recommendations. 2.  LVEDP of 21 mmHg with preserved LV function.     Neuro/Psych  PSYCHIATRIC DISORDERS Anxiety     TIACVA    GI/Hepatic negative GI ROS, Neg liver ROS,,,  Endo/Other  diabetes    Renal/GU negative Renal ROS     Musculoskeletal  (+) Arthritis ,    Abdominal  (+) + obese  Peds  Hematology negative hematology ROS (+)   Anesthesia Other Findings   Reproductive/Obstetrics                             Anesthesia Physical Anesthesia Plan  ASA: 4  Anesthesia Plan: General   Post-op Pain Management: Tylenol PO (pre-op)* and Gabapentin PO (pre-op)*   Induction: Intravenous  PONV Risk Score and Plan: 4 or greater and Ondansetron, Dexamethasone, Treatment may vary due to age or medical condition, Midazolam and Scopolamine patch - Pre-op  Airway Management Planned: Oral ETT and Video Laryngoscope Planned  Additional Equipment: Arterial line  Intra-op Plan:   Post-operative Plan: Extubation in OR  Informed Consent: I have reviewed the patients History and Physical, chart, labs and discussed the procedure including the risks, benefits and alternatives for the proposed anesthesia with the patient or authorized representative who has indicated his/her understanding and acceptance.     Dental advisory given  Plan Discussed with: CRNA  Anesthesia Plan Comments: (2 x PIV  See PAT note 06/23/2023)       Anesthesia Quick Evaluation

## 2023-07-02 NOTE — Telephone Encounter (Signed)
Received cardiology clearance

## 2023-07-03 LAB — BASIC METABOLIC PANEL
BUN/Creatinine Ratio: 30 — ABNORMAL HIGH (ref 9–23)
BUN: 18 mg/dL (ref 6–24)
CO2: 25 mmol/L (ref 20–29)
Calcium: 9.2 mg/dL (ref 8.7–10.2)
Chloride: 103 mmol/L (ref 96–106)
Creatinine, Ser: 0.6 mg/dL (ref 0.57–1.00)
Glucose: 134 mg/dL — ABNORMAL HIGH (ref 70–99)
Potassium: 4.8 mmol/L (ref 3.5–5.2)
Sodium: 139 mmol/L (ref 134–144)
eGFR: 105 mL/min/{1.73_m2} (ref >59)

## 2023-07-06 ENCOUNTER — Ambulatory Visit (HOSPITAL_COMMUNITY): Payer: BC Managed Care – PPO

## 2023-07-06 ENCOUNTER — Other Ambulatory Visit: Payer: Self-pay

## 2023-07-06 ENCOUNTER — Encounter (HOSPITAL_COMMUNITY): Payer: Self-pay | Admitting: Psychiatry

## 2023-07-06 ENCOUNTER — Ambulatory Visit (HOSPITAL_COMMUNITY)
Admission: RE | Admit: 2023-07-06 | Discharge: 2023-07-06 | Disposition: A | Discharge status: Home / Self Care | Payer: BC Managed Care – PPO | Source: Ambulatory Visit | Attending: Psychiatry | Admitting: Psychiatry

## 2023-07-06 ENCOUNTER — Ambulatory Visit (HOSPITAL_COMMUNITY): Payer: BC Managed Care – PPO | Admitting: Physician Assistant

## 2023-07-06 ENCOUNTER — Encounter (HOSPITAL_COMMUNITY)
Admission: RE | Disposition: A | Discharge status: Home / Self Care | Payer: Self-pay | Source: Ambulatory Visit | Attending: Psychiatry

## 2023-07-06 DIAGNOSIS — Z87891 Personal history of nicotine dependence: Secondary | ICD-10-CM | POA: Insufficient documentation

## 2023-07-06 DIAGNOSIS — Z951 Presence of aortocoronary bypass graft: Secondary | ICD-10-CM | POA: Insufficient documentation

## 2023-07-06 DIAGNOSIS — D259 Leiomyoma of uterus, unspecified: Secondary | ICD-10-CM | POA: Diagnosis not present

## 2023-07-06 DIAGNOSIS — N8302 Follicular cyst of left ovary: Secondary | ICD-10-CM | POA: Diagnosis not present

## 2023-07-06 DIAGNOSIS — Z7902 Long term (current) use of antithrombotics/antiplatelets: Secondary | ICD-10-CM | POA: Insufficient documentation

## 2023-07-06 DIAGNOSIS — Z8673 Personal history of transient ischemic attack (TIA), and cerebral infarction without residual deficits: Secondary | ICD-10-CM | POA: Diagnosis not present

## 2023-07-06 DIAGNOSIS — N888 Other specified noninflammatory disorders of cervix uteri: Secondary | ICD-10-CM | POA: Diagnosis not present

## 2023-07-06 DIAGNOSIS — C541 Malignant neoplasm of endometrium: Secondary | ICD-10-CM

## 2023-07-06 DIAGNOSIS — F419 Anxiety disorder, unspecified: Secondary | ICD-10-CM | POA: Diagnosis not present

## 2023-07-06 DIAGNOSIS — Z7982 Long term (current) use of aspirin: Secondary | ICD-10-CM | POA: Insufficient documentation

## 2023-07-06 DIAGNOSIS — N950 Postmenopausal bleeding: Secondary | ICD-10-CM | POA: Diagnosis not present

## 2023-07-06 DIAGNOSIS — I2510 Atherosclerotic heart disease of native coronary artery without angina pectoris: Secondary | ICD-10-CM | POA: Insufficient documentation

## 2023-07-06 DIAGNOSIS — I350 Nonrheumatic aortic (valve) stenosis: Secondary | ICD-10-CM | POA: Diagnosis not present

## 2023-07-06 DIAGNOSIS — Z79899 Other long term (current) drug therapy: Secondary | ICD-10-CM | POA: Diagnosis not present

## 2023-07-06 DIAGNOSIS — Z01818 Encounter for other preprocedural examination: Secondary | ICD-10-CM

## 2023-07-06 DIAGNOSIS — D270 Benign neoplasm of right ovary: Secondary | ICD-10-CM | POA: Diagnosis not present

## 2023-07-06 DIAGNOSIS — E119 Type 2 diabetes mellitus without complications: Secondary | ICD-10-CM | POA: Insufficient documentation

## 2023-07-06 DIAGNOSIS — Z6841 Body Mass Index (BMI) 40.0 and over, adult: Secondary | ICD-10-CM | POA: Diagnosis not present

## 2023-07-06 DIAGNOSIS — I10 Essential (primary) hypertension: Secondary | ICD-10-CM | POA: Diagnosis not present

## 2023-07-06 DIAGNOSIS — M1990 Unspecified osteoarthritis, unspecified site: Secondary | ICD-10-CM | POA: Insufficient documentation

## 2023-07-06 DIAGNOSIS — Z7984 Long term (current) use of oral hypoglycemic drugs: Secondary | ICD-10-CM | POA: Insufficient documentation

## 2023-07-06 DIAGNOSIS — E66813 Obesity, class 3: Secondary | ICD-10-CM

## 2023-07-06 DIAGNOSIS — E1165 Type 2 diabetes mellitus with hyperglycemia: Secondary | ICD-10-CM

## 2023-07-06 DIAGNOSIS — Z833 Family history of diabetes mellitus: Secondary | ICD-10-CM | POA: Insufficient documentation

## 2023-07-06 HISTORY — PX: ROBOTIC ASSISTED TOTAL HYSTERECTOMY WITH BILATERAL SALPINGO OOPHERECTOMY: SHX6086

## 2023-07-06 LAB — GLUCOSE, CAPILLARY
Glucose-Capillary: 149 mg/dL — ABNORMAL HIGH (ref 70–99)
Glucose-Capillary: 126 mg/dL — ABNORMAL HIGH (ref 70–99)
Glucose-Capillary: 155 mg/dL — ABNORMAL HIGH (ref 70–99)

## 2023-07-06 SURGERY — HYSTERECTOMY, TOTAL, ROBOT-ASSISTED, LAPAROSCOPIC, WITH BILATERAL SALPINGO-OOPHORECTOMY
Anesthesia: General

## 2023-07-06 MED ORDER — LACTATED RINGERS IV SOLN: INTRAVENOUS | Status: DC

## 2023-07-06 MED ORDER — LEVONORGESTREL 20 MCG/DAY IU IUD
1.0000 | INTRAUTERINE_SYSTEM | INTRAUTERINE | Status: DC
  Filled 2023-07-06: qty 1

## 2023-07-06 MED ORDER — ROCURONIUM BROMIDE 10 MG/ML (PF) SYRINGE
PREFILLED_SYRINGE | INTRAVENOUS | Status: AC
Start: 2023-07-06 — End: ?
  Filled 2023-07-06: qty 10

## 2023-07-06 MED ORDER — ONDANSETRON HCL 4 MG/2ML IJ SOLN
INTRAMUSCULAR | Status: AC
  Filled 2023-07-06: qty 2

## 2023-07-06 MED ORDER — ROCURONIUM BROMIDE 100 MG/10ML IV SOLN
INTRAVENOUS | Status: DC | PRN
  Administered 2023-07-06: 15 mg via INTRAVENOUS
  Administered 2023-07-06: 75 mg via INTRAVENOUS
  Administered 2023-07-06: 20 mg via INTRAVENOUS

## 2023-07-06 MED ORDER — ORAL CARE MOUTH RINSE: 15.0000 mL | Freq: Once | OROMUCOSAL | Status: AC

## 2023-07-06 MED ORDER — SUGAMMADEX SODIUM 200 MG/2ML IV SOLN
INTRAVENOUS | Status: DC | PRN
  Administered 2023-07-06: 200 mg via INTRAVENOUS

## 2023-07-06 MED ORDER — PROPOFOL 500 MG/50ML IV EMUL
INTRAVENOUS | Status: AC
  Filled 2023-07-06: qty 50

## 2023-07-06 MED ORDER — INSULIN ASPART 100 UNIT/ML IJ SOLN
0.0000 [IU] | INTRAMUSCULAR | Status: DC | PRN
  Administered 2023-07-06: 2 [IU] via SUBCUTANEOUS
  Filled 2023-07-06: qty 1

## 2023-07-06 MED ORDER — OXYCODONE HCL 5 MG PO TABS
ORAL_TABLET | ORAL | Status: AC
  Filled 2023-07-06: qty 1

## 2023-07-06 MED ORDER — PROPOFOL 500 MG/50ML IV EMUL
INTRAVENOUS | Status: DC | PRN
  Administered 2023-07-06: 50 ug/kg/min via INTRAVENOUS

## 2023-07-06 MED ORDER — FENTANYL CITRATE (PF) 100 MCG/2ML IJ SOLN
INTRAMUSCULAR | Status: AC
  Filled 2023-07-06: qty 2

## 2023-07-06 MED ORDER — DEXAMETHASONE SODIUM PHOSPHATE 10 MG/ML IJ SOLN
INTRAMUSCULAR | Status: AC
  Filled 2023-07-06: qty 1

## 2023-07-06 MED ORDER — ACETAMINOPHEN 500 MG PO TABS
1000.0000 mg | ORAL_TABLET | ORAL | Status: AC
  Administered 2023-07-06: 1000 mg via ORAL
  Filled 2023-07-06: qty 2

## 2023-07-06 MED ORDER — LIDOCAINE HCL (PF) 2 % IJ SOLN
INTRAMUSCULAR | Status: DC | PRN
  Administered 2023-07-06: 1.5 mg/kg/h

## 2023-07-06 MED ORDER — FENTANYL CITRATE (PF) 100 MCG/2ML IJ SOLN
INTRAMUSCULAR | Status: DC | PRN
  Administered 2023-07-06 (×6): 50 ug via INTRAVENOUS

## 2023-07-06 MED ORDER — LIDOCAINE HCL (PF) 2 % IJ SOLN
INTRAMUSCULAR | Status: AC
  Filled 2023-07-06: qty 5

## 2023-07-06 MED ORDER — SODIUM CHLORIDE 0.9% FLUSH: 3.0000 mL | Freq: Two times a day (BID) | INTRAVENOUS | Status: DC

## 2023-07-06 MED ORDER — SCOPOLAMINE 1 MG/3DAYS TD PT72
1.0000 | MEDICATED_PATCH | TRANSDERMAL | Status: DC
  Administered 2023-07-06: 1.5 mg via TRANSDERMAL
  Filled 2023-07-06: qty 1

## 2023-07-06 MED ORDER — CHLORHEXIDINE GLUCONATE 0.12 % MT SOLN
15.0000 mL | Freq: Once | OROMUCOSAL | Status: AC
  Administered 2023-07-06: 15 mL via OROMUCOSAL

## 2023-07-06 MED ORDER — OXYCODONE HCL 5 MG PO TABS
5.0000 mg | ORAL_TABLET | Freq: Once | ORAL | Status: AC
  Administered 2023-07-06: 5 mg via ORAL

## 2023-07-06 MED ORDER — HYDROMORPHONE HCL 1 MG/ML IJ SOLN: 0.2500 mg | INTRAMUSCULAR | Status: DC | PRN

## 2023-07-06 MED ORDER — ALBUMIN HUMAN 5 % IV SOLN
INTRAVENOUS | Status: AC
Start: 2023-07-06 — End: ?
  Filled 2023-07-06: qty 250

## 2023-07-06 MED ORDER — DROPERIDOL 2.5 MG/ML IJ SOLN: 0.6250 mg | Freq: Once | INTRAMUSCULAR | Status: DC | PRN

## 2023-07-06 MED ORDER — DEXAMETHASONE SODIUM PHOSPHATE 4 MG/ML IJ SOLN: 4.0000 mg | INTRAMUSCULAR | Status: DC

## 2023-07-06 MED ORDER — STERILE WATER FOR IRRIGATION IR SOLN
Status: DC | PRN
  Administered 2023-07-06: 1000 mL

## 2023-07-06 MED ORDER — DEXMEDETOMIDINE HCL IN NACL 80 MCG/20ML IV SOLN
INTRAVENOUS | Status: DC | PRN
  Administered 2023-07-06 (×2): 8 ug via INTRAVENOUS

## 2023-07-06 MED ORDER — STERILE WATER FOR INJECTION IJ SOLN
INTRAMUSCULAR | Status: DC | PRN
  Administered 2023-07-06: 5 mL via INTRAMUSCULAR

## 2023-07-06 MED ORDER — LACTATED RINGERS IR SOLN
Status: DC | PRN
  Administered 2023-07-06: 1000 mL

## 2023-07-06 MED ORDER — LIDOCAINE HCL (CARDIAC) PF 100 MG/5ML IV SOSY
PREFILLED_SYRINGE | INTRAVENOUS | Status: DC | PRN
  Administered 2023-07-06: 100 mg via INTRAVENOUS

## 2023-07-06 MED ORDER — PROPOFOL 10 MG/ML IV BOLUS
INTRAVENOUS | Status: AC
  Filled 2023-07-06: qty 20

## 2023-07-06 MED ORDER — PROPOFOL 10 MG/ML IV BOLUS
INTRAVENOUS | Status: DC | PRN
  Administered 2023-07-06: 150 mg via INTRAVENOUS
  Administered 2023-07-06: 50 mg via INTRAVENOUS

## 2023-07-06 MED ORDER — PHENYLEPHRINE HCL-NACL 20-0.9 MG/250ML-% IV SOLN
INTRAVENOUS | Status: DC | PRN
  Administered 2023-07-06: 10 ug/min via INTRAVENOUS

## 2023-07-06 MED ORDER — PROPOFOL 1000 MG/100ML IV EMUL
INTRAVENOUS | Status: AC
Start: 2023-07-06 — End: ?
  Filled 2023-07-06: qty 100

## 2023-07-06 MED ORDER — CEFAZOLIN IN SODIUM CHLORIDE 3-0.9 GM/100ML-% IV SOLN
3.0000 g | INTRAVENOUS | Status: AC
  Administered 2023-07-06: 3 g via INTRAVENOUS
  Filled 2023-07-06: qty 100

## 2023-07-06 MED ORDER — ONDANSETRON HCL 4 MG/2ML IJ SOLN
INTRAMUSCULAR | Status: DC | PRN
  Administered 2023-07-06: 4 mg via INTRAVENOUS

## 2023-07-06 MED ORDER — PROPOFOL 1000 MG/100ML IV EMUL
INTRAVENOUS | Status: AC
  Filled 2023-07-06: qty 100

## 2023-07-06 MED ORDER — BUPIVACAINE HCL 0.25 % IJ SOLN
INTRAMUSCULAR | Status: AC
Start: 2023-07-06 — End: ?
  Filled 2023-07-06: qty 1

## 2023-07-06 MED ORDER — HEPARIN SODIUM (PORCINE) 5000 UNIT/ML IJ SOLN
5000.0000 [IU] | INTRAMUSCULAR | Status: AC
  Administered 2023-07-06: 5000 [IU] via SUBCUTANEOUS
  Filled 2023-07-06: qty 1

## 2023-07-06 MED ORDER — LIDOCAINE HCL 2 % IJ SOLN
INTRAMUSCULAR | Status: AC
  Filled 2023-07-06: qty 20

## 2023-07-06 MED ORDER — ALBUMIN HUMAN 5 % IV SOLN
INTRAVENOUS | Status: DC | PRN
Start: 2023-07-06 — End: 2023-07-06

## 2023-07-06 MED ORDER — BUPIVACAINE HCL 0.25 % IJ SOLN
INTRAMUSCULAR | Status: DC | PRN
  Administered 2023-07-06: 24 mL

## 2023-07-06 SURGICAL SUPPLY — 78 items
ADH SKN CLS APL DERMABOND .7 (GAUZE/BANDAGES/DRESSINGS) ×1
AGENT HMST KT MTR STRL THRMB (HEMOSTASIS) ×1
APL ESCP 34 STRL LF DISP (HEMOSTASIS)
APPLICATOR SURGIFLO ENDO (HEMOSTASIS) IMPLANT
BAG LAPAROSCOPIC 12 15 PORT 16 (BASKET) IMPLANT
BAG RETRIEVAL 12/15 (BASKET)
BLADE SURG SZ10 CARB STEEL (BLADE) IMPLANT
COVER BACK TABLE 60X90IN (DRAPES) ×1 IMPLANT
COVER TIP SHEARS 8 DVNC (MISCELLANEOUS) ×1 IMPLANT
DERMABOND ADVANCED .7 DNX12 (GAUZE/BANDAGES/DRESSINGS) ×1 IMPLANT
DRAPE ARM DVNC X/XI (DISPOSABLE) ×4 IMPLANT
DRAPE COLUMN DVNC XI (DISPOSABLE) ×1 IMPLANT
DRAPE SHEET LG 3/4 BI-LAMINATE (DRAPES) ×1 IMPLANT
DRAPE SURG IRRIG POUCH 19X23 (DRAPES) ×1 IMPLANT
DRIVER NDL MEGA SUTCUT DVNCXI (INSTRUMENTS) ×1 IMPLANT
DRIVER NDLE MEGA SUTCUT DVNCXI (INSTRUMENTS) ×1
DRSG OPSITE POSTOP 4X6 (GAUZE/BANDAGES/DRESSINGS) IMPLANT
DRSG OPSITE POSTOP 4X8 (GAUZE/BANDAGES/DRESSINGS) IMPLANT
ELECT PENCIL ROCKER SW 15FT (MISCELLANEOUS) IMPLANT
ELECT REM PT RETURN 15FT ADLT (MISCELLANEOUS) ×1 IMPLANT
FORCEPS BPLR FENES DVNC XI (FORCEP) ×1 IMPLANT
FORCEPS PROGRASP DVNC XI (FORCEP) ×1 IMPLANT
GAUZE 4X4 16PLY ~~LOC~~+RFID DBL (SPONGE) ×1 IMPLANT
GLOVE BIO SURGEON STRL SZ 6 (GLOVE) ×4 IMPLANT
GLOVE BIO SURGEON STRL SZ 6.5 (GLOVE) ×1 IMPLANT
GLOVE BIOGEL PI IND STRL 6.5 (GLOVE) ×2 IMPLANT
GOWN STRL REUS W/ TWL LRG LVL3 (GOWN DISPOSABLE) ×4 IMPLANT
GOWN STRL REUS W/TWL LRG LVL3 (GOWN DISPOSABLE) ×5
GRASPER SUT TROCAR 14GX15 (MISCELLANEOUS) IMPLANT
HOLDER FOLEY CATH W/STRAP (MISCELLANEOUS) IMPLANT
IRRIG SUCT STRYKERFLOW 2 WTIP (MISCELLANEOUS) ×1
IRRIGATION SUCT STRKRFLW 2 WTP (MISCELLANEOUS) ×1 IMPLANT
KIT PROCEDURE DVNC SI (MISCELLANEOUS) IMPLANT
KIT TURNOVER KIT A (KITS) IMPLANT
LIGASURE IMPACT 36 18CM CVD LR (INSTRUMENTS) IMPLANT
MANIPULATOR ADVINCU DEL 3.0 PL (MISCELLANEOUS) IMPLANT
MANIPULATOR ADVINCU DEL 3.5 PL (MISCELLANEOUS) IMPLANT
MANIPULATOR UTERINE 4.5 ZUMI (MISCELLANEOUS) IMPLANT
NDL HYPO 21X1.5 SAFETY (NEEDLE) ×1 IMPLANT
NDL INSUFFLATION 14GA 120MM (NEEDLE) IMPLANT
NDL SPNL 20GX3.5 QUINCKE YW (NEEDLE) IMPLANT
NEEDLE HYPO 21X1.5 SAFETY (NEEDLE) ×1
NEEDLE INSUFFLATION 14GA 120MM (NEEDLE)
NEEDLE SPNL 20GX3.5 QUINCKE YW (NEEDLE) ×1
OBTURATOR OPTICAL STND 8 DVNC (TROCAR) ×1
OBTURATOR OPTICALSTD 8 DVNC (TROCAR) ×1 IMPLANT
PACK ROBOT GYN CUSTOM WL (TRAY / TRAY PROCEDURE) ×1 IMPLANT
PAD ARMBOARD 7.5X6 YLW CONV (MISCELLANEOUS) ×1 IMPLANT
PAD POSITIONING PINK XL (MISCELLANEOUS) ×1 IMPLANT
PORT ACCESS TROCAR AIRSEAL 12 (TROCAR) IMPLANT
SCISSORS MNPLR CVD DVNC XI (INSTRUMENTS) ×1 IMPLANT
SCRUB CHG 4% DYNA-HEX 4OZ (MISCELLANEOUS) ×2 IMPLANT
SEAL UNIV 5-12 XI (MISCELLANEOUS) ×4 IMPLANT
SET TRI-LUMEN FLTR TB AIRSEAL (TUBING) ×1 IMPLANT
SPIKE FLUID TRANSFER (MISCELLANEOUS) ×1 IMPLANT
SPONGE T-LAP 18X18 ~~LOC~~+RFID (SPONGE) IMPLANT
SURGIFLO W/THROMBIN 8M KIT (HEMOSTASIS) IMPLANT
SUT MNCRL AB 4-0 PS2 18 (SUTURE) IMPLANT
SUT PDS AB 1 TP1 54 (SUTURE) IMPLANT
SUT VIC AB 0 CT1 27 (SUTURE)
SUT VIC AB 0 CT1 27XBRD ANTBC (SUTURE) IMPLANT
SUT VIC AB 2-0 CT1 27 (SUTURE)
SUT VIC AB 2-0 CT1 TAPERPNT 27 (SUTURE) IMPLANT
SUT VIC AB 4-0 PS2 18 (SUTURE) ×2 IMPLANT
SUT VICRYL 0 27 CT2 27 ABS (SUTURE) ×1 IMPLANT
SUT VLOC 180 0 9IN GS21 (SUTURE) IMPLANT
SYR 10ML LL (SYRINGE) IMPLANT
SYS BAG RETRIEVAL 10MM (BASKET)
SYS WOUND ALEXIS 18CM MED (MISCELLANEOUS)
SYSTEM BAG RETRIEVAL 10MM (BASKET) IMPLANT
SYSTEM WOUND ALEXIS 18CM MED (MISCELLANEOUS) IMPLANT
TOWEL OR NON WOVEN STRL DISP B (DISPOSABLE) IMPLANT
TRAP SPECIMEN MUCUS 40CC (MISCELLANEOUS) IMPLANT
TRAY FOLEY MTR SLVR 16FR STAT (SET/KITS/TRAYS/PACK) ×1 IMPLANT
TROCAR PORT AIRSEAL 5X120 (TROCAR) IMPLANT
UNDERPAD 30X36 HEAVY ABSORB (UNDERPADS AND DIAPERS) ×2 IMPLANT
WATER STERILE IRR 1000ML POUR (IV SOLUTION) ×1 IMPLANT
YANKAUER SUCT BULB TIP 10FT TU (MISCELLANEOUS) IMPLANT

## 2023-07-06 NOTE — Plan of Care (Signed)
[facility] Tonsillectomy/Adenoidectomy, Postoperative PEDS care plan entered in error.

## 2023-07-06 NOTE — Anesthesia Procedure Notes (Signed)
Procedure Name: Intubation Date/Time: 07/06/2023 3:48 PM  Performed by: Maurene Capes, CRNAPre-anesthesia Checklist: Patient identified, Emergency Drugs available, Suction available and Patient being monitored Patient Re-evaluated:Patient Re-evaluated prior to induction Oxygen Delivery Method: Circle System Utilized Preoxygenation: Pre-oxygenation with 100% oxygen Induction Type: IV induction Ventilation: Mask ventilation without difficulty Laryngoscope Size: Mac and 4 Tube type: Oral Tube size: 7.5 mm Number of attempts: 1 Airway Equipment and Method: Stylet Placement Confirmation: ETT inserted through vocal cords under direct vision, positive ETCO2 and breath sounds checked- equal and bilateral Secured at: 22 cm Tube secured with: Tape Dental Injury: Teeth and Oropharynx as per pre-operative assessment

## 2023-07-06 NOTE — Transfer of Care (Signed)
Immediate Anesthesia Transfer of Care Note  Patient: Penny Huerta  Procedure(s) Performed: XI ROBOTIC ASSISTED TOTAL HYSTERECTOMY WITH BILATERAL SALPINGO OOPHORECTOMY and CYSTOSCOPY  Patient Location: PACU  Anesthesia Type:General  Level of Consciousness: awake, alert , oriented, and patient cooperative  Airway & Oxygen Therapy: Patient Spontanous Breathing and Patient connected to face mask oxygen  Post-op Assessment: Report given to RN and Post -op Vital signs reviewed and stable  Post vital signs: Reviewed and stable  Last Vitals:  Vitals Value Taken Time  BP 128/69 07/06/23 1915  Temp 36.4 C 07/06/23 1912  Pulse 75 07/06/23 1922  Resp 17 07/06/23 1922  SpO2 90 % 07/06/23 1922  Vitals shown include unfiled device data.  Last Pain:  Vitals:   07/06/23 1915  TempSrc:   PainSc: 0-No pain      Patients Stated Pain Goal: 3 (07/06/23 1219)  Complications: No notable events documented.

## 2023-07-06 NOTE — Anesthesia Postprocedure Evaluation (Signed)
Anesthesia Post Note  Patient: Penny Huerta  Procedure(s) Performed: XI ROBOTIC ASSISTED TOTAL HYSTERECTOMY WITH BILATERAL SALPINGO OOPHORECTOMY and CYSTOSCOPY     Patient location during evaluation: PACU Anesthesia Type: General Level of consciousness: sedated and patient cooperative Pain management: pain level controlled Vital Signs Assessment: post-procedure vital signs reviewed and stable Respiratory status: spontaneous breathing Cardiovascular status: stable Anesthetic complications: no   No notable events documented.  Last Vitals:  Vitals:   07/06/23 2030 07/06/23 2045  BP: (!) 161/69 (!) 166/96  Pulse: 67 75  Resp: 10 19  Temp:    SpO2: 94% 94%    Last Pain:  Vitals:   07/06/23 2045  TempSrc:   PainSc: 0-No pain                 Lewie Loron

## 2023-07-06 NOTE — Interval H&P Note (Signed)
History and Physical Interval Note:  07/06/2023 2:37 PM  Penny Huerta  has presented today for surgery, with the diagnosis of ENDOMETRIAL CANCER.  The various methods of treatment have been discussed with the patient and family. After consideration of risks, benefits and other options for treatment, the patient has consented to  Procedure(s): XI ROBOTIC ASSISTED TOTAL HYSTERECTOMY WITH BILATERAL SALPINGO OOPHORECTOMY (N/A) SENTINEL NODE BIOPSY (N/A) POSSIBLE LYMPH NODE DISSECTION (N/A) POSSIBLE DILATATION AND CURETTAGE (N/A) POSSIBLE MIRENA INTRAUTERINE DEVICE (IUD) INSERTION (N/A) as a surgical intervention.  The patient's history has been reviewed, patient examined, no change in status, stable for surgery.  I have reviewed the patient's chart and labs.  Questions were answered to the patient's satisfaction.     Shalandra Leu

## 2023-07-06 NOTE — Op Note (Signed)
GYNECOLOGIC ONCOLOGY OPERATIVE NOTE  Date of Service: 07/06/2023  Preoperative Diagnosis: FIGO grade 1 endometrioid endometrial cancer, Morbid obesity (BMI 58)  Postoperative Diagnosis: Same  Procedures: Robotic-assisted total laparoscopic hysterectomy, bilateral salpingo-oophorectomy, cystoscopy (Modifer 22: extreme morbid obesity, BMI 58, with significant retroperitoneal and intraperitoneal adiposity requiring additional OR personnel for positioning and retraction. Obesity made retroperitoneal visualization limited, increasing the complexity of the case and necessitating additional instrumentation for retraction and to create safe exposure. Obesity related complexity increased the duration of the procedure by 60 minutes.)    Surgeon: Clide Cliff, MD  Assistants: Antionette Char, MD and (an MD assistant was necessary for tissue manipulation, management of robotic instrumentation, retraction and positioning due to the complexity of the case and hospital policies)  Anesthesia: General  Estimated Blood Loss: 200 mL    Fluids: 100 ml, crystalloid  Urine Output: 500 ml, clear yellow  Findings: Upper abdominal survey with nodular appearance of liver, possible fatty liver. Normal diaphragm. Normal appearing small and large bowel. Increased visceral adiposity limiting visualization in the pelvis. Normal uterus, tubes, and left ovary.  Approximate 3cm adnexal mass on the right ovary. No evidence of peritoneal disease, ascites, or carcinomatosis.  Evaluation for sentinel lymph nodes deferred due to increased retroperitoneal and visceral adiposity significantly limiting visualization in the pelvis and limiting safety of retroperitoneal dissection.  Cystoscopy with normal efflux from bilateral ureteral orifices.   Specimens:  ID Type Source Tests Collected by Time Destination  1 : Uterus, cervix, bilateral tubes and ovaries Tissue PATH Gyn tumor resection SURGICAL PATHOLOGY Clide Cliff,  MD 07/06/2023 1742     Complications:  None  Indications for Procedure: Penny Huerta is a 57 y.o. woman with FIGO grade 1 endometrioid endometrial cancer.  Prior to the procedure, all risks, benefits, and alternatives were discussed and informed surgical consent was signed.  Procedure: Patient was taken to the operating room where general anesthesia was achieved.  Additional time was required for appropriate access and A-line placement by anesthesia and adequate positioning by the OR team.  She was positioned in dorsal lithotomy and prepped and draped.  A foley catheter was inserted into the bladder. 1 ml of dilute Indo-Cyanine dye was was injected at 1cm and 1mm deep at 3 and 9 o'clock in the cervical stroma.  The cervix was dilated and an Advincula uterine manipulator with a colpotomy ring was inserted into the uterus.  A 12 mm incision was made in the left upper quadrant near Palmer's point.  The abdomen was entered with a 5 mm OptiView trocar under direct visualization.  The abdomen was insufflated, the patient placed in steep Trendelenburg, and additional trocars were placed as follows: an 8mm trocar superior to the umbilicus, two 8 mm robotic trocars in the right abdomen, and one 8 mm robotic trocar in the left abdomen.  The left upper quadrant trocar was removed and replaced with a 12 mm airseal trocar.  All trocars were placed under direct visualization.  Patient was unable to tolerate maximal Trendelenburg, but was able to tolerate 25 degrees of Trendelenburg with decreased insufflation pressures.  The bowels were moved into the upper abdomen but mobilization was limited.  The DaVinci robotic surgical system was brought to the patient's bedside and docked.  Due to increased retroperitoneal and visceral adiposity and limited mobilization of bowels, making visualization challenging in the pelvis, decision made to defer sentinel lymph node evaluation. The right round ligament was transected and the  retroperitoneum entered.  The right ureter was  identified. The right infundibulopelvic ligament was isolated, cauterized, and transected. The posterior peritoneum was opened to the KOH ring.  The anterior peritoneum was opened and the bladder flap was created.  The right uterine artery was skeletonized, cauterized, and transected at the level of the KOH ring. Additional cautery was used in a C-shaped fashion to allow the remainder of the broad, cardinal, and uterosacral ligaments with the uterine vessels to be transected and fall away from the KOH ring. A similar procedure was performed on the left side.  A colpotomy was made circumferentially following the contours of the KOH ring.  The uterine specimen was removed through the vagina.    Due to increased adiposity of the bladder and rectum, visualization of the cuff was initially challenging and bleeding was noted from the cuff. The vaginal cuff was closed with a running stitch of 0 v-loc suture in 2 layers.  With closure of the cuff, improved hemostasis was achieved.  The pelvis was irrigated and all operative sites were found to be hemostatic.  Surgiflo was placed at the vaginal cuff.  All instruments were removed and the robot was taken from the patient's bedside.   The bladder was then backfilled with 200 cc of fluid.  The Foley catheter was removed.  The laparoscope was introduced into the bladder with trauma noted at the proximal urethra from entry.  A survey was completed with no evidence of injury to the bladder otherwise and normal efflux from bilateral ureteral orifices.  The catheter was then replaced and the bladder and the bladder drained.  The fascia at the 12 mm incision was closed with 0 Vicryl using a PMI device. The abdomen was desufflated and all ports were removed. The skin at all incisions was closed with 4-0 Vicryl to reapproximate the subcutaneous tissue and 4-0 monocryl in a subcuticular fashion followed by surgical glue.  Patient  tolerated the procedure well. Sponge, lap, and instrument counts were correct.  Patient received 3 gm of Ancef prior to skin incision for routine perioperative antibiotic prophylaxis.  She was extubated and taken to the PACU in stable condition.  Clide Cliff, MD Gynecologic Oncology

## 2023-07-06 NOTE — Discharge Instructions (Addendum)
AFTER SURGERY INSTRUCTIONS   Return to work: 4-6 weeks if applicable   Activity: 1. Be up and out of the bed during the day.  Take a nap if needed.  You may walk up steps but be careful and use the hand rail.  Stair climbing will tire you more than you think, you may need to stop part way and rest.    2. No lifting or straining for 6 weeks over 10 pounds. No pushing, pulling, straining for 6 weeks.   3. No driving for around 1 week(s).  Do not drive if you are taking narcotic pain medicine and make sure that your reaction time has returned.    4. You can shower as soon as the next day after surgery. Shower daily.  Use your regular soap and water (not directly on the incision) and pat your incision(s) dry afterwards; don't rub.  No tub baths or submerging your body in water until cleared by your surgeon. If you have the soap that was given to you by pre-surgical testing that was used before surgery, you do not need to use it afterwards because this can irritate your incisions.    5. No sexual activity and nothing in the vagina for 12 weeks.   6. You may experience a small amount of clear drainage from your incisions, which is normal.  If the drainage persists, increases, or changes color please call the office.   7. Do not use creams, lotions, or ointments such as neosporin on your incisions after surgery until advised by your surgeon because they can cause removal of the dermabond glue on your incisions.     8. You may experience vaginal spotting after surgery or when the stitches at the top of the vagina begin to dissolve.  The spotting is normal but if you experience heavy bleeding, call our office.   9. Take Tylenol first for pain if you are able to take these medication and only use narcotic pain medication for severe pain not relieved by the Tylenol.  Monitor your Tylenol intake to a max of 4,000 mg in a 24 hour period.    Diet: 1. Low sodium Heart Healthy Diet is recommended but you are  cleared to resume your normal (before surgery) diet after your procedure.   2. It is safe to use a laxative, such as Miralax or Colace, if you have difficulty moving your bowels. You have been prescribed Sennakot-S to take at bedtime every evening after surgery to keep bowel movements regular and to prevent constipation.     Wound Care: 1. Keep clean and dry.  Shower daily.   Reasons to call the Doctor: Fever - Oral temperature greater than 100.4 degrees Fahrenheit Foul-smelling vaginal discharge Difficulty urinating Nausea and vomiting Increased pain at the site of the incision that is unrelieved with pain medicine. Difficulty breathing with or without chest pain New calf pain especially if only on one side Sudden, continuing increased vaginal bleeding with or without clots.   Contacts: For questions or concerns you should contact:   Dr. Clide Cliff at [phone]   Warner Mccreedy, NP at [phone]   After Hours: call [phone] and have the GYN Oncologist paged/contacted (after 5 pm or on the weekends). You will speak with an after hours RN and let he or she know you have had surgery.   Messages sent via mychart are for non-urgent matters and are not responded to after hours so for urgent needs, please call the after hours  number.

## 2023-07-07 ENCOUNTER — Telehealth: Payer: Self-pay | Admitting: *Deleted

## 2023-07-07 ENCOUNTER — Encounter (HOSPITAL_COMMUNITY): Payer: Self-pay | Admitting: Psychiatry

## 2023-07-07 NOTE — Telephone Encounter (Signed)
Spoke with Penny Huerta this morning. She states she is eating, drinking and urinating well. She has not had a BM yet but is passing gas. She is taking senokot as prescribed and encouraged her to drink plenty of water. She denies fever or chills. Incisions are dry and intact. She rates her pain 3/10. Her pain is controlled with tylenol.    Instructed to call office with any fever, chills, purulent drainage, uncontrolled pain or any other questions or concerns. Patient verbalizes understanding.   Pt aware of post op appointments as well as the office number [phone] and after hours number [phone] to call if she has any questions or concerns

## 2023-07-19 ENCOUNTER — Encounter: Payer: Self-pay | Admitting: Psychiatry

## 2023-07-19 ENCOUNTER — Inpatient Hospital Stay: Payer: BC Managed Care – PPO | Attending: Psychiatry | Admitting: Psychiatry

## 2023-07-19 ENCOUNTER — Other Ambulatory Visit: Payer: Self-pay

## 2023-07-19 VITALS — BP 119/76 | HR 71 | Temp 98.5°F | Resp 20 | Wt 345.0 lb

## 2023-07-19 DIAGNOSIS — Z9079 Acquired absence of other genital organ(s): Secondary | ICD-10-CM | POA: Insufficient documentation

## 2023-07-19 DIAGNOSIS — Z90710 Acquired absence of both cervix and uterus: Secondary | ICD-10-CM | POA: Diagnosis not present

## 2023-07-19 DIAGNOSIS — Z7189 Other specified counseling: Secondary | ICD-10-CM

## 2023-07-19 DIAGNOSIS — C541 Malignant neoplasm of endometrium: Secondary | ICD-10-CM | POA: Insufficient documentation

## 2023-07-19 DIAGNOSIS — Z90722 Acquired absence of ovaries, bilateral: Secondary | ICD-10-CM | POA: Insufficient documentation

## 2023-07-19 NOTE — Patient Instructions (Signed)
It was a pleasure to see you in clinic today. - Healing well. - No lifting until 6 weeks postop, nothing in vagina until 10 weeks postop - Ct scan in about 6 weeks. - Return visit planned for 6 months.  Thank you very much for allowing me to provide care for you today.  I appreciate your confidence in choosing our Gynecologic Oncology team at [facility].  If you have any questions about your visit today please call our office or send [place] a MyChart message and we will get back to you as soon as possible.

## 2023-07-19 NOTE — Progress Notes (Signed)
Gynecologic Oncology Return Clinic Visit  Date of Service: 07/19/2023 Referring Provider: Shea Evans, MD   Assessment & Plan: Penny Huerta is a 57 y.o. 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  Postop: - Pt recovering well from surgery and healing appropriately postoperatively - Intraoperative findings and pathology results reviewed. - Ongoing postoperative expectations and precautions reviewed. Continue with no lifting >10lbs through 6 weeks postoperatively  Endometrial cancer: - Pathology results reviewed in detail - No high-intermediate risk factors. SLNBx not performed. Pathology would not have met Mayo criteria. - Recommend observation. - Signs/symptoms of recurrence reviewed. - Surveillance reviewed. Follow-up q6 months x2 years then yearly.  - Given no lymph node evaluation, plan CT a/p in 6 weeks for baseline lymph node evaluation. If negative, follow-up in 55months for surveillance. - Will follow-up p53 results.  RTC 55mo.  Clide Cliff, MD Gynecologic Oncology   Medical Decision Making I personally spent  TOTAL 20 minutes face-to-face and non-face-to-face in the care of this patient, which includes all pre, intra, and post visit time on the date of service. The discussion of diagnosis and treatment of endometrial cancer is beyond the scope of routine postoperative care.  ----------------------- Reason for Visit: Postop/Treatment counseling  Treatment History: Oncology History  Endometrial cancer, grade I (HCC)  05/28/2023 Initial Biopsy   Endometrial biopsy: Well-differentiated endometrial adenocarcinoma (FIGO I)   05/28/2023 Initial Diagnosis   Endometrial cancer, grade I (HCC)   06/07/2023 Imaging   Pelvic  ultrasound: Impression: Technically difficult study due to patient BMI There appears to be a post left intramural fibroid 2.1 x 2.1 x 1.8 cm. The endometrium appears focally thickened towards the fundus measuring 16.4 mm.  Cannot rule out polyp versus other. Limited TAS was added to evaluate ovaries. The right ovary was not visualized. No free fluid.   07/06/2023 Cancer Staging   Staging form: Corpus Uteri - Carcinoma and Carcinosarcoma, AJCC 8th Edition - Clinical stage from 07/06/2023: FIGO Stage IA, calculated as Stage Unknown (cT1a, cNX, cM0) - Signed by Clide Cliff, MD on 07/19/2023 Histopathologic type: Endometrioid adenocarcinoma, NOS Stage prefix: Initial diagnosis Histologic grade (G): G1 Histologic grading system: 3 grade system   07/06/2023 Surgery   Robotic-assisted total laparoscopic hysterectomy, bilateral salpingo-oophorectomy, cystoscopy   Findings: Upper abdominal survey with nodular appearance of liver, possible fatty liver. Normal diaphragm. Normal appearing small and large bowel. Increased visceral adiposity limiting visualization in the pelvis. Normal uterus, tubes, and left ovary.  Approximate 3cm adnexal mass on the right ovary. No evidence of peritoneal disease, ascites, or carcinomatosis.  Evaluation for sentinel lymph nodes deferred due to increased retroperitoneal and visceral adiposity significantly limiting visualization in the pelvis and limiting safety of retroperitoneal dissection.  Cystoscopy with normal efflux from bilateral ureteral orifices.      07/06/2023 Pathology Results   A. UTERUS, CERVIX, BILATERAL FALLOPIAN TUBES, AND OVARIES, RESECTION: - Endometrium: Focal endometrioid carcinoma, FIGO grade 1, with background extensive endometrial intraepithelial neoplasia - Myometrial invasion: Less than 50% - Cervix: Benign, nabothian cysts - Leiomyomata - Bilateral fallopian tubes: Benign, no significant pathologic changes - Right ovary: Benign  monodermal teratoma - Left ovary: Benign, follicular cyst - see oncology table  ONCOLOGY TABLE:   UTERUS, CARCINOMA OR CARCINOSARCOMA: Resection  Procedure: Total hysterectomy and bilateral salpingo-oophorectomy Histologic Type: Endometrioid carcinoma, FIGO grade 1 Histologic  Grade: Grade 1 Myometrial Invasion:      Depth of Myometrial Invasion (mm): 5 mm      Myometrial Thickness (mm): 15 mm      Percentage of Myometrial Invasion: 30% Uterine Serosa Involvement: Not identified Cervical stromal Involvement: Not identified Extent of involvement of other tissue/organs: Not identified Peritoneal/Ascitic Fluid: Not applicable Lymphovascular Invasion: Not identified Regional Lymph Nodes: Not applicable Distant Metastasis:      Distant Site(s) Involved: Not applicable Pathologic Stage Classification (pTNM, AJCC 8th Edition): pT 1a, pN not assigned Ancillary Studies: MMR / MSI testing will be ordered Representative Tumor Block: A3 Comment(s): P53 immunohistochemical stain is pending and will be reported in addendum   IHC EXPRESSION RESULTS  TEST           RESULT MLH1:          Preserved nuclear expression MSH2:          Preserved nuclear expression MSH6:          Preserved nuclear expression PMS2:          Preserved nuclear expression      Interval History: Pt reports that she is recovering well from surgery. She is using tylenol as needed for pain. She is eating and drinking well. She is voiding without issue and having regular bowel movements. No bleeding.    Past Medical/Surgical History: Past Medical History:  Diagnosis Date   Allergy    Anxiety    Arthritis    Atrial fibrillation (HCC)    B12 deficiency    Carotid stenosis    Chest pain    Coronary artery disease    Diabetes mellitus without complication (HCC)    Family history of hyperlipidemia    Heart murmur    ARRHYTHMIA   History of drug use    Hx of CABG    Hyperlipidemia    Hypertension    Obesity     SOB (shortness of breath)    Stroke [facility])     Past Surgical History:  Procedure Laterality Date   CORONARY ARTERY BYPASS GRAFT  01/31/2022   x 4   DILATION AND CURETTAGE OF UTERUS     HYSTEROSCOPY W/ ENDOMETRIAL ABLATION  09/14/2009   x2   LEFT HEART CATH AND CORONARY ANGIOGRAPHY N/A 12/05/2021   Procedure: LEFT HEART CATH AND CORONARY ANGIOGRAPHY;  Surgeon: Orbie Pyo, MD;  Location: [facility];  Service: Cardiovascular;  Laterality: N/A;   ROBOTIC ASSISTED TOTAL HYSTERECTOMY WITH BILATERAL SALPINGO OOPHERECTOMY N/A 07/06/2023   Procedure: XI ROBOTIC ASSISTED TOTAL HYSTERECTOMY WITH BILATERAL SALPINGO OOPHORECTOMY and CYSTOSCOPY;  Surgeon: Clide Cliff, MD;  Location: [facility];  Service: Gynecology;  Laterality: N/A;    Family History  Problem Relation Age of Onset   Heart disease Mother    Stroke Mother    Hyperlipidemia Mother    Transient ischemic attack Mother    Hypertension Mother    Diabetes Mother    Depression Mother    Anxiety disorder Mother    Obesity Mother    Stroke Father    Transient ischemic attack Father    Sudden death Maternal Uncle    Heart disease Maternal Grandmother 26   Hyperlipidemia Maternal Grandmother    Hyperlipidemia Maternal Grandfather    Heart disease Maternal Grandfather    Ovarian cancer Paternal Grandmother    Stroke Paternal Grandfather    Hypertension Paternal Grandfather    Diabetes Paternal Grandfather    Colon cancer Neg Hx  Breast cancer Neg Hx    Endometrial cancer Neg Hx    Pancreatic cancer Neg Hx    Prostate cancer Neg Hx     Social History   Socioeconomic History   Marital status: Married    Spouse name: Not on file   Number of children: Not on file   Years of education: Not on file   Highest education level: Not on file  Occupational History   Occupation: Print production planner  Tobacco Use   Smoking status: Former    Current packs/day: 0.00    Types: Cigarettes    Start date: 09/14/1977    Quit  date: 09/14/1997    Years since quitting: 25.8   Smokeless tobacco: Never  Vaping Use   Vaping status: Never Used  Substance and Sexual Activity   Alcohol use: Not Currently    Comment: h/o heavy use in 6s   Drug use: Not Currently    Comment: "most of them" - quit in 1994   Sexual activity: Not Currently    Partners: Male  Other Topics Concern   Not on file  Social History Narrative   Exercise-- none   Right handed   Social Determinants of Health   Financial Resource Strain: High Risk (10/04/2022)   Received from [facility], [facility]   Overall Financial Resource Strain (CARDIA)    Difficulty of Paying Living Expenses: Hard  Food Insecurity: No Food Insecurity (10/04/2022)   Received from [facility], [facility]   Hunger Vital Sign    Worried About Running Out of Food in the Last Year: Never true    Ran Out of Food in the Last Year: Never true  Transportation Needs: No Transportation Needs (10/04/2022)   Received from [employer], [facility]   PRAPARE - Transportation    Lack of Transportation (Medical): No    Lack of Transportation (Non-Medical): No  Physical Activity: Inactive (10/04/2022)   Received from [facility], [facility]   Exercise Vital Sign    Days of Exercise per Week: 0 days    Minutes of Exercise per Session: 0 min  Stress: Stress Concern Present (10/04/2022)   Received from [facility], [facility] - Occupational Stress Questionnaire    Feeling of Stress : Rather much  Social Connections: Socially Isolated (10/04/2022)   Received from [facility], [facility]   Social Network    How would you rate your social network (family, work, friends)?: Little participation, lonely and socially isolated    Current Medications:  Current Outpatient Medications:    acetaminophen (TYLENOL) 500 MG tablet, Take 1,000 mg by mouth every 6 (six) hours as needed for mild pain., Disp: , Rfl:    aspirin  EC 81 MG tablet, Take 81 mg by mouth daily. Swallow whole., Disp: , Rfl:    Evolocumab (REPATHA SURECLICK) 140 MG/ML SOAJ, INJECT 1 DOSE INTO THE SKIN EVERY 14 DAYS, Disp: 6 mL, Rfl: 3   ezetimibe (ZETIA) 10 MG tablet, Take 1 tablet (10 mg total) by mouth daily., Disp: 90 tablet, Rfl: 3   losartan (COZAAR) 50 MG tablet, Take 1 tablet (50 mg total) by mouth daily., Disp: 90 tablet, Rfl: 3   metFORMIN (GLUCOPHAGE-XR) 500 MG 24 hr tablet, Take 500 mg by mouth 2 (two) times daily., Disp: , Rfl:    metoprolol tartrate (LOPRESSOR) 25 MG tablet, Take 37.5 mg by mouth 2 (two) times daily., Disp: , Rfl:    OVER THE COUNTER  MEDICATION, Take 1 capsule by mouth 2 (two) times daily. Doterra Tri-Ease (for allergies), Disp: , Rfl:    sertraline (ZOLOFT) 50 MG tablet, Take 1 tablet (50 mg total) by mouth daily., Disp: 90 tablet, Rfl: 0   Vitamin D, Ergocalciferol, (DRISDOL) 1.25 MG (50000 UNIT) CAPS capsule, Take 50,000 Units by mouth every 7 (seven) days., Disp: , Rfl:   Review of Symptoms: Complete 10-system review is negative except as above in Interval History.  Physical Exam: BP 119/76 (BP Location: Right Arm, Patient Position: Sitting)   Pulse 71   Temp 98.5 F (36.9 C) (Oral)   Resp 20   Wt (!) 345 lb (156.5 kg)   LMP 01/06/2012   SpO2 98%   BMI 57.41 kg/m  General: Alert, oriented, no acute distress. HEENT: Normocephalic, atraumatic. Neck symmetric without masses. Sclera anicteric.  Chest: Normal work of breathing. Clear to auscultation bilaterally.   Cardiovascular: Regular rate. Murmur noted. Abdomen: Soft, nontender.  Normoactive bowel sounds.  No masses appreciated.  Well-healing incisions with glue and old bruising. Extremities: Grossly normal range of motion.  Warm, well perfused.  No edema bilaterally. Skin: No rashes or lesions noted. GU: Normal appearing external genitalia without erythema, excoriation, or lesions.  Speculum exam reveals intact, well healing vaginal cuff.  Bimanual  exam reveals intact vaginal cuff. Exam chaperoned by Kimberly Swazil[place], CMA   Laboratory & Radiologic Studies: Surgical pathology (07/06/23) FINAL MICROSCOPIC DIAGNOSIS:  A. UTERUS, CERVIX, BILATERAL FALLOPIAN TUBES, AND OVARIES, RESECTION: - Endometrium: Focal endometrioid carcinoma, FIGO grade 1, with background extensive endometrial intraepithelial neoplasia - Myometrial invasion: Less than 50% - Cervix: Benign, nabothian cysts - Leiomyomata - Bilateral fallopian tubes: Benign, no significant pathologic changes - Right ovary: Benign monodermal teratoma - Left ovary: Benign, follicular cyst - see oncology table  ONCOLOGY TABLE:   UTERUS, CARCINOMA OR CARCINOSARCOMA: Resection  Procedure: Total hysterectomy and bilateral salpingo-oophorectomy Histologic Type: Endometrioid carcinoma, FIGO grade 1 Histologic Grade: Grade 1 Myometrial Invasion:      Depth of Myometrial Invasion (mm): 5 mm      Myometrial Thickness (mm): 15 mm      Percentage of Myometrial Invasion: 30% Uterine Serosa Involvement: Not identified Cervical stromal Involvement: Not identified Extent of involvement of other tissue/organs: Not identified Peritoneal/Ascitic Fluid: Not applicable Lymphovascular Invasion: Not identified Regional Lymph Nodes: Not applicable Distant Metastasis:      Distant Site(s) Involved: Not applicable Pathologic Stage Classification (pTNM, AJCC 8th Edition): pT 1a, pN not assigned Ancillary Studies: MMR / MSI testing will be ordered Representative Tumor Block: A3 Comment(s): P53 immunohistochemical stain is pending and will be reported in addendum   IHC EXPRESSION RESULTS  TEST           RESULT MLH1:          Preserved nuclear expression MSH2:          Preserved nuclear expression MSH6:          Preserved nuclear expression PMS2:          Preserved nuclear expression

## 2023-07-20 LAB — SURGICAL PATHOLOGY

## 2023-07-21 LAB — MOLECULAR PATHOLOGY

## 2023-07-27 ENCOUNTER — Telehealth: Payer: Self-pay | Admitting: *Deleted

## 2023-07-27 ENCOUNTER — Encounter: Payer: Self-pay | Admitting: Psychiatry

## 2023-07-27 NOTE — Telephone Encounter (Signed)
Spoke with Penny Huerta who left a message through MyChart in regards to when she can go back to work.   Relayed message from Penny Mccreedy, NP typically it depends on your occupation. Main restriction is no heavy lifting, strenuous activity for 6 weeks post op. If you need a note to return sooner we can take care of that. 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 Advised patient if she is concerned about the long drives she can wait the full 6 weeks, then return back to work after that. Pt verbalized understanding.  Pt also states she has been nauseated all day and wants to know if this is normal? Pt denies vomiting, states she is having regular bowel movements daily, denies fever and or chills, states her blood sugars have been within normal range, hasn't made any changes to medications, and no appetite changes as well. Advised patient if her symptom of nausea worsens, continues and is accompanied by fever, chills and other symptoms to call the office. Pt verbalized understanding and thanked the office for calling.

## 2023-07-30 ENCOUNTER — Ambulatory Visit: Payer: BC Managed Care – PPO | Attending: Cardiovascular Disease | Admitting: Pharmacist

## 2023-07-30 VITALS — Wt 347.8 lb

## 2023-07-30 DIAGNOSIS — Z6841 Body Mass Index (BMI) 40.0 and over, adult: Secondary | ICD-10-CM

## 2023-07-30 DIAGNOSIS — E66813 Obesity, class 3: Secondary | ICD-10-CM

## 2023-07-30 DIAGNOSIS — E1165 Type 2 diabetes mellitus with hyperglycemia: Secondary | ICD-10-CM | POA: Diagnosis not present

## 2023-07-30 MED ORDER — EMPAGLIFLOZIN 10 MG PO TABS: 10.0000 mg | ORAL_TABLET | Freq: Every day | ORAL | 11 refills | Status: DC

## 2023-07-30 NOTE — Progress Notes (Unsigned)
Patient ID: Penny Huerta                 DOB: [birth year]                    MRN: [id number]     HPI: SHAWNDRIKA BRINSON is a 57 y.o. female patient referred to pharmacy clinic by Dr. Lynnette Caffey to initiate GLP1-RA therapy. 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 Most recent BMI 57.41.  Most recently saw Dr. Lynnette Caffey on 06/25/23 for preoperative recommendations for gynecologic surgery. Noted that she had stopped her Jardiance. She had been feeling well after participating in cardiac rehabilitation - but her husband then developed some complex medical problems and needed a lot of care. ShOB thought to be due to deconditioning. Was not having CP. She was off Repatha for some time due to cost, but had restarted - LDL-C off Repatha was 181 mg/dL. Clinic BP elevated to 150/90 - so losartan was increased to 50 mg PO daily, plavix was stopped. Pt was referrred to PharmD clinic for weight management, with plan to attempt to initiate GLP-1 or GLP-1/GIP RA after surgery. Pre-operative echo with normal LVEF 60-65%.  Today pt reports doing ok. States she felt good the first week after her surgery, but this past week has felt much worse. She has not been able to sleep flat due to pain in her knee so she is only sleeping a couple hours per day or night. Endorses using ibuprofen 800 mg BID. She had one day last week where she felt very nauseous, zofran relieved symptoms. She has been alternating between constipation and diarrhea - has been having 3-4 BM/day, where previously she had ~1 prior to surgery. Discussed that this is probably not uncommon with major surgery in the abdominal cavity. She is not having ShOB, swelling, CP,  headache. She reports that she has been eating ~ 2 meals per day - diet as described below, feels that she would need to significantly change her diet if she starting a GLP-1 or GLP-1/GIP RA. Noted MRI in 2021 in setting of R upper Q pain x1 day- stated potential mild pancreatitis, but needed to correlate with lipase. Does not appear that lipase was drawn or is available in care everywhere, however patient denies ever being diagnosed with pancreatitis. Hx of TG 200-300s, elevated to 514 mg/dL on most recent labs - though pt states she was not fasting which likely influenced TG. She has done a fair amount of research on Ozempic/Wegovy - is concerned about pancreatitis (knows 4 people that have died from pancreatitis), cancer, gastroparesis, negative cognitive effects. Extensively discussed the risk benefit profile of GLP-1s. Discussed common AE and how to best avoid GI upset. Discussed administration, cost.   Baseline weight and BMI: 57.88, 347 lbs  Current meds that affect weight: sertraline 50 mg PO daily, metformin 500 mg PO BID - pt pays OOP for [employer] sensor- she is not wearing one right now but she feels like it helps her make food choices, or learn about which foods affect her BG the most.   Diet:  Breakfast: egg sandwich (~1PM) Dinner: burger- without bread, chicken -very picky (does not want to eat previously frozen/prepared chicken from fast food restaurants) Does  like salad and vegetables  Most meals are fast food, takeout. She does not feel like there are healthy options at the places they go to eat. Her and her husband used to cook at home- but he cannot stand in the kitchen to cook anymore so they have to eat out.   Does not have sugary beverages. Not consuming alcohol.  Exercise: no regular exercise right now. Currently working from home -sitting at a desk all day. Will be going back to work soon.  Family History:  Family History  Problem Relation Age of Onset   Heart  disease Mother    Stroke Mother    Hyperlipidemia Mother    Transient ischemic attack Mother    Hypertension Mother    Diabetes Mother    Depression Mother    Anxiety disorder Mother    Obesity Mother    Stroke Father    Transient ischemic attack Father    Sudden death Maternal Uncle    Heart disease Maternal Grandmother 26   Hyperlipidemia Maternal Grandmother    Hyperlipidemia Maternal Grandfather    Heart disease Maternal Grandfather    Ovarian cancer Paternal Grandmother    Stroke Paternal Grandfather    Hypertension Paternal Grandfather    Diabetes Paternal Grandfather    Colon cancer Neg Hx    Breast cancer Neg Hx    Endometrial cancer Neg Hx    Pancreatic cancer Neg Hx    Prostate cancer Neg Hx     Social History: former smoker - 20 pack yr hx (quit 2000). No alcohol intake.  Labs: Lab Results  Component Value Date   HGBA1C 7.3 (H) 06/23/2023    Wt Readings from Last 1 Encounters:  07/19/23 (!) 345 lb (156.5 kg)    BP Readings from Last 1 Encounters:  07/19/23 119/76   Pulse Readings from Last 1 Encounters:  07/19/23 71       Component Value Date/Time   CHOL 236 (H) 07/01/2022 0857   TRIG 262 (H) 07/01/2022 0857   HDL 33 (L) 07/01/2022 0857   CHOLHDL 12.6 12/06/2021 0742   VLDL 62 (H) 12/06/2021 0742   LDLCALC 154 (H) 07/01/2022 0857   LDLDIRECT 214.4 03/10/2013 1109    Past Medical History:  Diagnosis Date   Allergy    Anxiety    Arthritis    Atrial fibrillation (HCC)    B12 deficiency    Carotid stenosis    Chest pain    Coronary artery disease    Diabetes mellitus without complication (HCC)    Family history of hyperlipidemia    Heart murmur    ARRHYTHMIA   History of drug use    Hx of CABG    Hyperlipidemia    Hypertension    Obesity    SOB (shortness of breath)    Stroke [facility])     Current Outpatient Medications on File Prior to Visit  Medication Sig Dispense Refill   acetaminophen (TYLENOL) 500 MG tablet Take 1,000 mg by  mouth every 6 (six) hours as needed for mild pain.     aspirin EC 81 MG tablet Take 81 mg by mouth daily. Swallow whole.     Evolocumab (REPATHA SURECLICK) 140 MG/ML SOAJ INJECT 1 DOSE INTO THE SKIN EVERY 14 DAYS 6 mL 3   ezetimibe (ZETIA) 10 MG tablet Take 1 tablet (10 mg total) by mouth daily. 90 tablet 3   losartan (COZAAR) 50 MG tablet Take 1 tablet (50 mg total) by mouth daily. 90  tablet 3   metFORMIN (GLUCOPHAGE-XR) 500 MG 24 hr tablet Take 500 mg by mouth 2 (two) times daily.     metoprolol tartrate (LOPRESSOR) 25 MG tablet Take 37.5 mg by mouth 2 (two) times daily.     OVER THE COUNTER MEDICATION Take 1 capsule by mouth 2 (two) times daily. Doterra Tri-Ease (for allergies)     sertraline (ZOLOFT) 50 MG tablet Take 1 tablet (50 mg total) by mouth daily. 90 tablet 0   Vitamin D, Ergocalciferol, (DRISDOL) 1.25 MG (50000 UNIT) CAPS capsule Take 50,000 Units by mouth every 7 (seven) days.     No current facility-administered medications on file prior to visit.    Allergies  Allergen Reactions   Alprazolam Other (See Comments)    Caused mood changes   Citalopram Hydrobromide Other (See Comments)    Mood changes   Lisinopril Cough   Nitrofurantoin Monohyd Macro Nausea And Vomiting   Statins Other (See Comments)    MUSCLE LOSS - lipitor, zocor   Sulfa Drugs Cross Reactors Nausea And Vomiting     Assessment/Plan:  1. Weight loss - Patient has not met goal of at least 5% of body weight loss with comprehensive lifestyle modifications alone in the past 3-6 months. Pt may be a good candidate for treatment with a GLP-1 or GLP-1/GIP RA - however she is still having abnormal GI symptoms, likely due to recent surgery (s/p total hysterectomy, bilateral oophorectomy, cytoscopy for endometrial cancer). Would recommend waiting at least one month to monitor for improvement in GI symptoms before initiation of pharmacotherapy. Confirmed pt is not pregnant (total hysterectomy). No personal or family hx  of medullary thyroid carcinoma (MTC) or Multiple Endocrine Neoplasia syndrome type 2 (MEN 2). Pt confirmed she does not have s/sx of gastroparesis. With TG > 500, pt may be at increased risk for pancreatitis with incretin mimetic - advised for her to restart Repatha and have her lipid panel rechecked prior to initiating GLP-1 or GLP-1/GIP.  Advised patient on common side effects including nausea, diarrhea, dyspepsia, decreased appetite, and fatigue. Counseled patient on reducing meal size and how to titrate medication to minimize side effects. Counseled patient to call if intolerable side effects or if experiencing dehydration, abdominal pain, or dizziness. Patient aware she would need to adhere to dietary modifications and will target at least 150 minutes of moderate intensity exercise weekly.   Provided patient with clinic pharmacist contact information [facility]). She will plan to reach out in late Dec or Jan if she is feeling better from a GI standpoint and would like to initiate treatment.   Medication Management - Pt stated that she had been advised by Dr. Lynnette Caffey to restart her Jardiance post-procedure for CV benefit and T2DM. Pt is eating, no elevated risk for euglycemic DKA - appropriate to resume. Sent in prescription to preferred pharmacy.  - Encouraged pt to limit use of ibuprofen given CVD hx. Instructed her to reach out to her primary care provider to explore alternative options if she continues to require 800 mg ibuprofen BID.  Thank you  Nils Pyle, PharmD PGY1 Pharmacy Resident   Olene Floss, Pharm.D, BCACP, BCPS, CPP [facility] [address] [place], [place] [address]  Phone: [phone]; Fax: [phone]

## 2023-07-30 NOTE — Patient Instructions (Addendum)
It was great to see you today!  Restart your Jardiance (empagliflozin) 10 mg daily  Restart your Repatha - inject 140 mg into the skin every 2 weeks.  Give [place] a call after the new year if you are feeling ok and would like to start Mounjaro or Ozempic.  We would like to recheck your lipid panel 1-2 months after you restart Repatha.

## 2023-08-16 ENCOUNTER — Other Ambulatory Visit: Payer: Self-pay | Admitting: *Deleted

## 2023-08-16 DIAGNOSIS — I6523 Occlusion and stenosis of bilateral carotid arteries: Secondary | ICD-10-CM

## 2023-08-25 NOTE — Progress Notes (Unsigned)
Office Note    HPI: Penny Huerta is a 57 y.o. (1966-01-12) female presenting in follow up with known bilateral carotid artery stenosis R>L.  At her last visit a year ago, Penny Huerta was unsure if she was going to pursue cerebrovascular revascularization at [facility] or at [facility].  Of note, she underwent CABG last year at [facility] after being turned down at [facility].    Penny Huerta initially presented to my clinic after having a migraine with aura with incidental finding of bilateral carotid artery stenosis, right greater than left.  I offered her surgery after left sided internal carotid velocities demonstrated stenosis greater than 80%.  She followed up at [facility], and underwent repeat CT scan demonstrating roughly 70% stenosis, and therefore elected to hold off on surgery.  She missed her follow-up with [facility] and may, and noted that the front and office staff or less than collegial.  She presents today to discuss possible revascularization.  Penny Huerta presents today accompanied by her husband.  Since last seen, she has undergone hysterectomy for endometrial cancer.  She is now home, doing well.  She is working to regain her stamina and preoperative activity level.  She denies recent symptoms of TIA, stroke, amaurosis.     The pt is  on a statin for cholesterol management.  The pt is  on a daily aspirin.   Other AC:  plavix The pt is  on medication for hypertension.   The pt is  diabetic.  Tobacco hx:  -  Past Medical History:  Diagnosis Date   Allergy    Anxiety    Arthritis    Atrial fibrillation (HCC)    B12 deficiency    Carotid stenosis    Chest pain    Coronary artery disease    Diabetes mellitus without complication (HCC)    Family history of hyperlipidemia    Heart murmur    ARRHYTHMIA   History of drug use    Hx of CABG    Hyperlipidemia    Hypertension    Obesity    SOB (shortness of breath)    Stroke [facility])     Past Surgical History:  Procedure  Laterality Date   CORONARY ARTERY BYPASS GRAFT  01/31/2022   x 4   DILATION AND CURETTAGE OF UTERUS     HYSTEROSCOPY W/ ENDOMETRIAL ABLATION  09/14/2009   x2   LEFT HEART CATH AND CORONARY ANGIOGRAPHY N/A 12/05/2021   Procedure: LEFT HEART CATH AND CORONARY ANGIOGRAPHY;  Surgeon: Orbie Pyo, MD;  Location: [facility] INVASIVE CV LAB;  Service: Cardiovascular;  Laterality: N/A;   ROBOTIC ASSISTED TOTAL HYSTERECTOMY WITH BILATERAL SALPINGO OOPHERECTOMY N/A 07/06/2023   Procedure: XI ROBOTIC ASSISTED TOTAL HYSTERECTOMY WITH BILATERAL SALPINGO OOPHORECTOMY and CYSTOSCOPY;  Surgeon: Clide Cliff, MD;  Location: [facility] ORS;  Service: Gynecology;  Laterality: N/A;    Social History   Socioeconomic History   Marital status: Married    Spouse name: Not on file   Number of children: Not on file   Years of education: Not on file   Highest education level: Not on file  Occupational History   Occupation: office manager  Tobacco Use   Smoking status: Former    Current packs/day: 0.00    Types: Cigarettes    Start date: 09/14/1977    Quit date: 09/14/1997    Years since quitting: 25.9   Smokeless tobacco: Never  Vaping Use   Vaping status: Never Used  Substance  and Sexual Activity   Alcohol use: Not Currently    Comment: h/o heavy use in 54s   Drug use: Not Currently    Comment: "most of them" - quit in 1994   Sexual activity: Not Currently    Partners: Male  Other Topics Concern   Not on file  Social History Narrative   Exercise-- none   Right handed   Social Determinants of Health   Financial Resource Strain: High Risk (10/04/2022)   Received from [facility], [facility]   Overall Financial Resource Strain (CARDIA)    Difficulty of Paying Living Expenses: Hard  Food Insecurity: No Food Insecurity (10/04/2022)   Received from [facility], [facility]   Hunger Vital Sign    Worried About Running Out of Food in the Last Year: Never true    Ran Out of Food in the Last Year:  Never true  Transportation Needs: No Transportation Needs (10/04/2022)   Received from [facility], [facility]   PRAPARE - Transportation    Lack of Transportation (Medical): No    Lack of Transportation (Non-Medical): No  Physical Activity: Inactive (10/04/2022)   Received from [facility], [facility]   Exercise Vital Sign    Days of Exercise per Week: 0 days    Minutes of Exercise per Session: 0 min  Stress: Stress Concern Present (10/04/2022)   Received from [facility], [facility] of Occupational Health - Occupational Stress Questionnaire    Feeling of Stress : Rather much  Social Connections: Socially Isolated (10/04/2022)   Received from [facility], [facility]   Social Network    How would you rate your social network (family, work, friends)?: Little participation, lonely and socially isolated  Intimate Partner Violence: Not At Risk (10/04/2022)   Received from [facility], [facility]   HITS    Over the last 12 months how often did your partner physically hurt you?: Never    Over the last 12 months how often did your partner insult you or talk down to you?: Sometimes    Over the last 12 months how often did your partner threaten you with physical harm?: Never    Over the last 12 months how often did your partner scream or curse at you?: Rarely   Family History  Problem Relation Age of Onset   Heart disease Mother    Stroke Mother    Hyperlipidemia Mother    Transient ischemic attack Mother    Hypertension Mother    Diabetes Mother    Depression Mother    Anxiety disorder Mother    Obesity Mother    Stroke Father    Transient ischemic attack Father    Sudden death Maternal Uncle    Heart disease Maternal Grandmother 26   Hyperlipidemia Maternal Grandmother    Hyperlipidemia Maternal Grandfather    Heart disease Maternal Grandfather    Ovarian cancer Paternal Grandmother    Stroke Paternal Grandfather    Hypertension Paternal  Grandfather    Diabetes Paternal Grandfather    Colon cancer Neg Hx    Breast cancer Neg Hx    Endometrial cancer Neg Hx    Pancreatic cancer Neg Hx    Prostate cancer Neg Hx     Current Outpatient Medications  Medication Sig Dispense Refill   acetaminophen (TYLENOL) 500 MG tablet Take 1,000 mg by mouth every 6 (six) hours as needed for mild pain. (Patient not taking: Reported on 07/30/2023)  aspirin EC 81 MG tablet Take 81 mg by mouth daily. Swallow whole.     empagliflozin (JARDIANCE) 10 MG TABS tablet Take 1 tablet (10 mg total) by mouth daily. 30 tablet 11   Evolocumab (REPATHA SURECLICK) 140 MG/ML SOAJ INJECT 1 DOSE INTO THE SKIN EVERY 14 DAYS 6 mL 3   ezetimibe (ZETIA) 10 MG tablet Take 1 tablet (10 mg total) by mouth daily. 90 tablet 3   ibuprofen (ADVIL) 800 MG tablet Take 800 mg by mouth every 8 (eight) hours as needed.     losartan (COZAAR) 50 MG tablet Take 1 tablet (50 mg total) by mouth daily. 90 tablet 3   metFORMIN (GLUCOPHAGE-XR) 500 MG 24 hr tablet Take 500 mg by mouth 2 (two) times daily.     metoprolol tartrate (LOPRESSOR) 25 MG tablet Take 37.5 mg by mouth 2 (two) times daily.     OVER THE COUNTER MEDICATION Take 1 capsule by mouth 2 (two) times daily. Doterra Tri-Ease (for allergies)     sertraline (ZOLOFT) 50 MG tablet Take 1 tablet (50 mg total) by mouth daily. 90 tablet 0   Vitamin D, Ergocalciferol, (DRISDOL) 1.25 MG (50000 UNIT) CAPS capsule Take 50,000 Units by mouth every 7 (seven) days.     No current facility-administered medications for this visit.    Allergies  Allergen Reactions   Alprazolam Other (See Comments)    Caused mood changes   Citalopram Hydrobromide Other (See Comments)    Mood changes   Lisinopril Cough   Nitrofurantoin Monohyd Macro Nausea And Vomiting   Statins Other (See Comments)    MUSCLE LOSS - lipitor, zocor   Sulfa Drugs Cross Reactors Nausea And Vomiting     REVIEW OF SYSTEMS:  [X]  denotes positive finding, [ ]  denotes  negative finding Cardiac  Comments:  Chest pain or chest pressure:    Shortness of breath upon exertion:    Short of breath when lying flat:    Irregular heart rhythm:        Vascular    Pain in calf, thigh, or hip brought on by ambulation:    Pain in feet at night that wakes you up from your sleep:     Blood clot in your veins:    Leg swelling:         Pulmonary    Oxygen at home:    Productive cough:     Wheezing:         Neurologic    Sudden weakness in arms or legs:     Sudden numbness in arms or legs:     Sudden onset of difficulty speaking or slurred speech:    Temporary loss of vision in one eye:     Problems with dizziness:         Gastrointestinal    Blood in stool:     Vomited blood:         Genitourinary    Burning when urinating:     Blood in urine:        Psychiatric    Major depression:         Hematologic    Bleeding problems:    Problems with blood clotting too easily:        Skin    Rashes or ulcers:        Constitutional    Fever or chills:      PHYSICAL EXAMINATION:  There were no vitals filed for this visit.   General: Morbid obesity,  pleasant Gait: Not observed HENT: WNL, normocephalic Pulmonary: normal non-labored breathing , without wheezing Cardiac: regular HR Abdomen: soft, NT, no masses Skin: without rashes Vascular Exam/Pulses:  Right Left  Radial 2+ (normal) 2+ (normal)  Ulnar 2+ (normal) 2+ (normal)                   Extremities: without ischemic changes, without Gangrene , without cellulitis; without open wounds;  Musculoskeletal: no muscle wasting or atrophy  Neurologic: A&O X 3;  No focal weakness or paresthesias are detected Psychiatric:  The pt has Normal affect.   Non-Invasive Vascular Imaging:    Summary:  Right Carotid: Velocities in the right ICA are consistent with a 60-79% stenosis.  Non-hemodynamically significant plaque <50% noted  in the CCA. The ECA appears >50% stenosed.   Left Carotid:  Velocities in the left ICA are consistent with a 40-59%  stenosis. Non-hemodynamically significant plaque <50% noted in the  CCA. The ECA appears >50% stenosed. Left ICA is poorly visauzlized  due to sound beam attenuation. Stenosis may be greater than  detected.   Vertebrals: Bilateral vertebral arteries demonstrate antegrade flow. Left               vertebral artery demonstrates high resistant flow.  Subclavians: Normal flow hemodynamics were seen in bilateral subclavian               arteries.      ASSESSMENT/PLAN: SHEPHANIE Huerta is a 57 y.o. female presenting with bilateral carotid artery stenosis, right greater than left.  CT angio completed in early 2023 for migraine with aura was of poor quality but demonstrated retropharyngeal carotid arteries with critical left ICA stenosis.    Since that time, she has seen previous vascular surgery, as well as [facility] neurosurgery and offered cerebrovascular revascularization, transfemoral stenting respectively.   She had a follow-up CT scan due to the poor contrast timing that demonstrated less than 70% stenosis at [facility], and therefore the plan was 37-month follow-up.  Unfortunately, I do not have access to the scan that was completed roughly a year ago.  Fortunately, Penny Huerta remains asymptomatic.  Bilateral carotid duplex ultrasound demonstrated less than 80% stenosis on the right, with significant shadowing on the left, making it nondiagnostic.  I discussed that prior to offering surgery, I would like to assess stenosis on repeat CT angiogram head and neck.  I discussed that the 2 modalities of treatment that I could offer would be carotid endarterectomy versus TCAR.  I am not sure if she is a TCAR candidate due to the short runway.  While she is a carotid endarterectomy candidate, she has a retropharyngeal carotid which has increased risk of cranial nerve injury and swallowing issues postop.  Penny Huerta and her husband are interested in  returning to [facility] for future follow-up.  I told her that I am happy to take care of her should she change her mind.  I asked that she continue best medical therapy.   Victorino Sparrow, MD [facility] [phone]

## 2023-08-26 ENCOUNTER — Encounter: Payer: Self-pay | Admitting: Vascular Surgery

## 2023-08-26 ENCOUNTER — Ambulatory Visit (HOSPITAL_COMMUNITY)
Admission: RE | Admit: 2023-08-26 | Discharge: 2023-08-26 | Disposition: A | Discharge status: Home / Self Care | Payer: BC Managed Care – PPO | Source: Ambulatory Visit | Attending: Vascular Surgery | Admitting: Vascular Surgery

## 2023-08-26 ENCOUNTER — Ambulatory Visit: Payer: BC Managed Care – PPO | Admitting: Vascular Surgery

## 2023-08-26 VITALS — BP 139/84 | HR 78 | Temp 98.4°F | Resp 20 | Ht 65.0 in | Wt 349.9 lb

## 2023-08-26 DIAGNOSIS — I6523 Occlusion and stenosis of bilateral carotid arteries: Secondary | ICD-10-CM | POA: Insufficient documentation

## 2023-08-30 ENCOUNTER — Encounter (HOSPITAL_COMMUNITY): Payer: Self-pay | Admitting: Radiology

## 2023-08-30 ENCOUNTER — Ambulatory Visit (HOSPITAL_COMMUNITY)
Admission: RE | Admit: 2023-08-30 | Discharge: 2023-08-30 | Disposition: A | Discharge status: Home / Self Care | Payer: BC Managed Care – PPO | Source: Ambulatory Visit | Attending: Psychiatry | Admitting: Psychiatry

## 2023-08-30 DIAGNOSIS — C541 Malignant neoplasm of endometrium: Secondary | ICD-10-CM | POA: Diagnosis present

## 2023-08-30 LAB — POCT I-STAT CREATININE: Creatinine, Ser: 0.6 mg/dL (ref 0.44–1.00)

## 2023-08-30 MED ORDER — IOHEXOL 300 MG/ML  SOLN
30.0000 mL | Freq: Once | INTRAMUSCULAR | Status: AC | PRN
  Administered 2023-08-30: 30 mL via ORAL

## 2023-08-30 MED ORDER — IOHEXOL 300 MG/ML  SOLN
100.0000 mL | Freq: Once | INTRAMUSCULAR | Status: AC | PRN
  Administered 2023-08-30: 100 mL via INTRAVENOUS

## 2023-09-24 ENCOUNTER — Other Ambulatory Visit: Payer: BC Managed Care – PPO

## 2023-09-24 DIAGNOSIS — E1169 Type 2 diabetes mellitus with other specified complication: Secondary | ICD-10-CM

## 2023-10-29 ENCOUNTER — Encounter (HOSPITAL_BASED_OUTPATIENT_CLINIC_OR_DEPARTMENT_OTHER): Payer: Self-pay

## 2023-10-29 ENCOUNTER — Ambulatory Visit (HOSPITAL_BASED_OUTPATIENT_CLINIC_OR_DEPARTMENT_OTHER): Payer: BC Managed Care – PPO | Admitting: Orthopaedic Surgery

## 2023-11-15 ENCOUNTER — Other Ambulatory Visit: Payer: Self-pay | Admitting: Urology

## 2023-11-16 NOTE — Progress Notes (Addendum)
 Anesthesia Review:  PCP: Delbert Harness Physical 10/06/23  Cardiologist : Lynnette Caffey LOV 06/25/23  LOV DR Hilty- 03/05/23 Vascular- DR Gerarda Fraction LOV 08/26/23   PPM/ ICD: Device Orders: Rep Notified:  Chest x-ray : EKG : 06/23/23  Echo : 07/02/23  Stress test: Cardiac Cath :  2023  CT Cors- 2023   Activity level: can do a flgiht of stairs without difficutly sometimes  Sleep Study/ CPAP : none  Fasting Blood Sugar :      / Checks Blood Sugar -- times a day:     DM- type 2 Freestyle Libre  Hgba1c-- 10/06/23-- 6.5  Jardiance- Hold for 72 hours prior to procedure Last dose on 11/22/23.  Metformin- none am of procedure.    Blood Thinner/ Instructions /Last Dose: ASA / Instructions/ Last Dose :    81 mg Aspirin    Telephone appt on 11/19/23 at 300pm. Med hx and preop instructions completed on 11/19/23 with pt.  At end of phone call pt aware to call Admi[facility] at [phone].      65 inches weight- 348 lbs [employer]

## 2023-11-17 NOTE — Patient Instructions (Signed)
 SURGICAL WAITING ROOM VISITATION  Patients having surgery or a procedure may have no more than 2 support people in the waiting area - these visitors may rotate.    Children under the age of 36 must have an adult with them who is not the patient.  Due to an increase in RSV and influenza rates and associated hospitalizations, children ages 57 and under may not visit patients in [facility] hospitals.  Visitors with respiratory illnesses are discouraged from visiting and should remain at home.  If the patient needs to stay at the hospital during part of their recovery, the visitor guidelines for inpatient rooms apply. Pre-op nurse will coordinate an appropriate time for 1 support person to accompany patient in pre-op.  This support person may not rotate.    Please refer to the [facility] website for the visitor guidelines for Inpatients (after your surgery is over and you are in a regular room).       Your procedure is scheduled on:  3/14/20025    Report to [facility] Main Entrance    Report to admitting at   [phone]   Call this number if you have problems the morning of surgery [phone]   Do not eat food  or drink liquiids :After Midnight.                              If you have questions, please contact your surgeon's office.       Oral Hygiene is also important to reduce your risk of infection.                                    Remember - BRUSH YOUR TEETH THE MORNING OF SURGERY WITH YOUR REGULAR TOOTHPASTE  DENTURES WILL BE REMOVED PRIOR TO SURGERY PLEASE DO NOT APPLY "Poly grip" OR ADHESIVES!!!   Do NOT smoke after Midnight   Stop all vitamins and herbal supplements 7 days before surgery.   Take these medicines the morning of surgery with A SIP OF WATER:  metoprolol,  zoloft              Jardiance- hold foror 72 hours prior to  proceduure.  Last  dose on 11/22/2023             Metformin- none day of surgery   DO NOT TAKE ANY ORAL DIABETIC  MEDICATIONS DAY OF YOUR SURGERY  Bring CPAP mask and tubing day of surgery.                              You may not have any metal on your body including hair pins, jewelry, and body piercing             Do not wear make-up, lotions, powders, perfumes/cologne, or deodorant  Do not wear nail polish including gel and S&S, artificial/acrylic nails, or any other type of covering on natural nails including finger and toenails. If you have artificial nails, gel coating, etc. that needs to be removed by a nail salon please have this removed prior to surgery or surgery may need to be canceled/ delayed if the surgeon/ anesthesia feels like they are unable to be safely monitored.   Do not shave  48 hours prior to surgery.  Men may shave face and neck.   Do not bring valuables to the hospital. [place] IS NOT             RESPONSIBLE   FOR VALUABLES.   Contacts, glasses, dentures or bridgework may not be worn into surgery.   Bring small overnight bag day of surgery.   DO NOT BRING YOUR HOME MEDICATIONS TO THE HOSPITAL. PHARMACY WILL DISPENSE MEDICATIONS LISTED ON YOUR MEDICATION LIST TO YOU DURING YOUR ADMISSION IN THE HOSPITAL!    Patients discharged on the day of surgery will not be allowed to drive home.  Someone NEEDS to stay with you for the first 24 hours after anesthesia.   Special Instructions: Bring a copy of your healthcare power of attorney and living will documents the day of surgery if you haven't scanned them before.              Please read over the following fact sheets you were given: IF YOU HAVE QUESTIONS ABOUT YOUR PRE-OP INSTRUCTIONS PLEASE CALL [phone]   If you received a COVID test during your pre-op visit  it is requested that you wear a mask when out in public, stay away from anyone that may not be feeling well and notify your surgeon if you develop symptoms. If you test positive for Covid or have been in contact with anyone that has tested positive in  the last 10 days please notify you surgeon.    [place] - Preparing for Surgery Before surgery, you can play an important role.  Because skin is not sterile, your skin needs to be as free of germs as possible.  You can reduce the number of germs on your skin by washing with CHG (chlorahexidine gluconate) soap before surgery.  CHG is an antiseptic cleaner which kills germs and bonds with the skin to continue killing germs even after washing. Please DO NOT use if you have an allergy to CHG or antibacterial soaps.  If your skin becomes reddened/irritated stop using the CHG and inform your nurse when you arrive at Short Stay. Do not shave (including legs and underarms) for at least 48 hours prior to the first CHG shower.  You may shave your face/neck. Please follow these instructions carefully:  1.  Shower with CHG Soap the night before surgery and the  morning of Surgery.  2.  If you choose to wash your hair, wash your hair first as usual with your  normal  shampoo.  3.  After you shampoo, rinse your hair and body thoroughly to remove the  shampoo.                           4.  Use CHG as you would any other liquid soap.  You can apply chg directly  to the skin and wash                       Gently with a scrungie or clean washcloth.  5.  Apply the CHG Soap to your body ONLY FROM THE NECK DOWN.   Do not use on face/ open                           Wound or open sores. Avoid contact with eyes, ears mouth and genitals (private parts).  Wash face,  Genitals (private parts) with your normal soap.             6.  Wash thoroughly, paying special attention to the area where your surgery  will be performed.  7.  Thoroughly rinse your body with warm water from the neck down.  8.  DO NOT shower/wash with your normal soap after using and rinsing off  the CHG Soap.                9.  Pat yourself dry with a clean towel.            10.  Wear clean pajamas.            11.  Place clean sheets  on your bed the night of your first shower and do not  sleep with pets. Day of Surgery : Do not apply any lotions/deodorants the morning of surgery.  Please wear clean clothes to the hospital/surgery center.  FAILURE TO FOLLOW THESE INSTRUCTIONS MAY RESULT IN THE CANCELLATION OF YOUR SURGERY PATIENT SIGNATURE_________________________________  NURSE SIGNATURE__________________________________  ________________________________________________________________________

## 2023-11-19 ENCOUNTER — Other Ambulatory Visit: Payer: Self-pay

## 2023-11-19 ENCOUNTER — Encounter (HOSPITAL_COMMUNITY)
Admission: RE | Admit: 2023-11-19 | Discharge: 2023-11-19 | Disposition: A | Discharge status: Home / Self Care | Source: Ambulatory Visit | Attending: Urology | Admitting: Urology

## 2023-11-19 ENCOUNTER — Encounter (HOSPITAL_COMMUNITY): Payer: Self-pay

## 2023-11-19 VITALS — Ht 65.0 in | Wt 348.0 lb

## 2023-11-19 DIAGNOSIS — Z01818 Encounter for other preprocedural examination: Secondary | ICD-10-CM

## 2023-11-19 HISTORY — DX: Depression, unspecified: F32.A

## 2023-11-19 HISTORY — DX: Pneumonia, unspecified organism: J18.9

## 2023-11-19 HISTORY — DX: Personal history of urinary calculi: Z87.442

## 2023-11-22 NOTE — Progress Notes (Addendum)
 Case: [id number] Date/Time: 11/26/23 0715   Procedure: CYSTOSCOPY/BILATERAL URETEROSCOPY/HOLMIUM LASER/STENT PLACEMENT (Bilateral)   Anesthesia type: General   Pre-op diagnosis: BILATERAL RENAL STONES   Location: [facility] PROC[facility]   Surgeons: Jerilee Field, MD       DISCUSSION: Penny Huerta is a 58 yo female who is being evaluated prior to surgery above. PMH of former smoking, CAD s/p CABG (01/2022), heart murmur, ?PAF, carotid artery disease, hx of CVA (11/2021), DM, endometrial cancer s/p hysterectomy (06/2023), anxiety, obesity (BMI 57), anxiety, depression.  Patient follows with Cardiology for CAD. She underwent CABG in May 2023. Last seen by Dr. Lynnette Caffey on 06/25/23 for pre op clearance prior to her hysterectomy for endometrial cancer. An echo was done and showed normal EF with mild AS. She was cleared and surgery was uncomplicated. Of note has hx of A.fib which was noted on the monitor when she had COVID in 2021 per chart review. She does not take any meds for this. It's also not listed as a diagnosis in Cardiology notes.  Patient follows with Vascular for carotid artery stenosis. She has been seen by [facility] and [facility] providers. Most recently she was seen by Dr. Karin Lieu at [facility]. Previously offered surgical intervention since CTA neck in 2023 showed severe right ICA stenosis however on most recent carotid US her stenosis was 60-70% on the right and 40-59% on the left. She was advised to repeat CTA of head/neck prior to any surgical intervention. Patient preferred to follow up at [facility] and consult is pending.  Patient with hx of CVA vs TIA vs complex migraine. She was admitted in March 2023 for stroke like symptoms. CT head was remarkable for age indeterminate infarcts. Patient refused MRI brain and left AMA. She did follow up with Neurology who felt that symptoms were likely from complex migraine but did recommend secondary stroke prevention due to risk factors.  Last seen by  PCP on 1/22 for routine f/u. She was referred to Urology due to staghorn calculous. She saw her PCP again on 1/29 and was treated for a UTI.  Discussed with Dr. Salvadore Farber. Ok to proceed.  VS: Ht 5\' 5"  (1.651 m)   Wt (!) 157.9 kg   LMP 01/06/2012   BMI 57.91 kg/m   PROVIDERS: [name], MD Cardiology: Alverda Skeans, MD Vascular: Gerarda Fraction, MD  LABS: Obtain DOS (all labs ordered are listed, but only abnormal results are displayed)  Labs Reviewed - No data to display   IMAGES: CT A/P 08/30/23:  IMPRESSION: 1. Status post hysterectomy without evidence of local recurrence. 2. No evidence of metastatic disease in the abdomen or pelvis. 3. Hepatomegaly with diffuse hepatic steatosis. 4. Cholelithiasis without findings of acute cholecystitis. 5. Bilateral nonobstructive renal stones including a staghorn type calculus in the left lower pole measuring 18 mm and a 10 mm stone in the right lower pole. 6. Colonic diverticulosis without findings of acute diverticulitis. 7.  Aortic Atherosclerosis (ICD10-I70.0).  EKG 06/23/23:  NSR rate 78  CV:  Carotid artery ultrasound 08/26/23:  Summary: Right Carotid: Velocities in the right ICA are consistent with a 60-79% stenosis. Non-hemodynamically significant plaque <50% noted in the CCA. The ECA appears >50% stenosed.  Left Carotid: Velocities in the left ICA are consistent with a 40-59% stenosis. Non-hemodynamically significant plaque <50% noted in the CCA. The ECA appears >50% stenosed. Left ICA is poorly visualized due to sound beam attenuation. Stenosis may be greater than detected.  Vertebrals:  Bilateral vertebral  arteries demonstrate antegrade flow. Left vertebral artery demonstrates high resistant flow. Subclavians: Normal flow hemodynamics were seen in bilateral subclavian arteries.  Echo 07/02/23:  IMPRESSIONS     1. Left ventricular ejection fraction, by estimation, is 60 to 65%. The  left ventricle has normal  function. The left ventricle has no regional  wall motion abnormalities. There is mild concentric left ventricular  hypertrophy. Left ventricular diastolic  parameters were normal.   2. Right ventricular systolic function is normal. The right ventricular  size is normal. Tricuspid regurgitation signal is inadequate for assessing  PA pressure.   3. The mitral valve is grossly normal. Trivial mitral valve  regurgitation. No evidence of mitral stenosis.   4. The aortic valve was not well visualized. Aortic valve regurgitation  is not visualized. Mild aortic valve stenosis. Aortic valve area, by VTI  measures 1.64 cm. Aortic valve mean gradient measures 15.5 mmHg. Aortic  valve Vmax measures 2.61 m/s.   Comparison(s): No significant change from prior study.   Past Medical History:  Diagnosis Date   Allergy    Anxiety    Arthritis    Atrial fibrillation (HCC)    B12 deficiency    Carotid stenosis    Chest pain    Coronary artery disease    Depression    Diabetes mellitus without complication (HCC)    endometrial ca 05/2023   Family history of hyperlipidemia    Heart murmur    ARRHYTHMIA   History of drug use    History of kidney stones    Hx of CABG    Hyperlipidemia    Hypertension    Obesity    Pneumonia    SOB (shortness of breath)    Stroke [facility])     Past Surgical History:  Procedure Laterality Date   CORONARY ARTERY BYPASS GRAFT  01/31/2022   x 4   DILATION AND CURETTAGE OF UTERUS     HYSTEROSCOPY W/ ENDOMETRIAL ABLATION  09/14/2009   x2   LEFT HEART CATH AND CORONARY ANGIOGRAPHY N/A 12/05/2021   Procedure: LEFT HEART CATH AND CORONARY ANGIOGRAPHY;  Surgeon: Orbie Pyo, MD;  Location: [facility] INVASIVE CV LAB;  Service: Cardiovascular;  Laterality: N/A;   ROBOTIC ASSISTED TOTAL HYSTERECTOMY WITH BILATERAL SALPINGO OOPHERECTOMY N/A 07/06/2023   Procedure: XI ROBOTIC ASSISTED TOTAL HYSTERECTOMY WITH BILATERAL SALPINGO OOPHORECTOMY and CYSTOSCOPY;  Surgeon: Clide Cliff, MD;  Location: [facility];  Service: Gynecology;  Laterality: N/A;    MEDICATIONS:  aspirin EC 81 MG tablet   Cholecalciferol (VITAMIN D3) 250 MCG (10000 UT) TABS   empagliflozin (JARDIANCE) 10 MG TABS tablet   Evolocumab (REPATHA SURECLICK) 140 MG/ML SOAJ   ezetimibe (ZETIA) 10 MG tablet   ibuprofen (ADVIL) 200 MG tablet   losartan (COZAAR) 50 MG tablet   metFORMIN (GLUCOPHAGE-XR) 500 MG 24 hr tablet   Metoprolol Tartrate 37.5 MG TABS   OVER THE COUNTER MEDICATION   sertraline (ZOLOFT) 50 MG tablet   No current facility-administered medications for this encounter.   Marcille Blanco [facility] Short Stay/Anesthesiology [facility] Phone [phone] 11/22/2023 2:49 PM

## 2023-11-22 NOTE — Anesthesia Preprocedure Evaluation (Signed)
 Anesthesia Evaluation  Patient identified by MRN, date of birth, ID band Patient awake    Reviewed: Allergy & Precautions, NPO status , Patient's Chart, lab work & pertinent test results, reviewed documented beta blocker date and time   Airway Mallampati: II  TM Distance: >3 FB Neck ROM: Full    Dental  (+) Teeth Intact, Dental Advisory Given   Pulmonary former smoker   Pulmonary exam normal breath sounds clear to auscultation       Cardiovascular hypertension, Pt. on home beta blockers (-) angina + CAD, + CABG and + Peripheral Vascular Disease  + dysrhythmias Atrial Fibrillation + Valvular Problems/Murmurs AS  Rhythm:Regular Rate:Normal + Systolic murmurs    Neuro/Psych  PSYCHIATRIC DISORDERS Anxiety Depression    Long hx with vascular surgery- back and forth here vs [facility]- was offered surgery initially for 80% L ICA stenosis, but then it was 70% on repeat imaging. Per vascular, would be a very difficult surgery anyway so they have elected to hold off, esp as she is asymptomatic  TIACVA, No Residual Symptoms    GI/Hepatic   Endo/Other  diabetes, Type 2, Oral Hypoglycemic Agents  Class 4 obesity  Renal/GU      Musculoskeletal  (+) Arthritis ,    Abdominal   Peds  Hematology   Anesthesia Other Findings   Reproductive/Obstetrics                             Anesthesia Physical Anesthesia Plan  ASA: 4  Anesthesia Plan: General   Post-op Pain Management: Tylenol PO (pre-op)* and Toradol IV (intra-op)*   Induction: Intravenous  PONV Risk Score and Plan: 4 or greater and Midazolam, Dexamethasone and Ondansetron  Airway Management Planned: LMA  Additional Equipment:   Intra-op Plan:   Post-operative Plan: Extubation in OR  Informed Consent: I have reviewed the patients History and Physical, chart, labs and discussed the procedure including the risks, benefits and alternatives for  the proposed anesthesia with the patient or authorized representative who has indicated his/her understanding and acceptance.     Dental advisory given  Plan Discussed with: CRNA  Anesthesia Plan Comments: (D/w PAT- Long hx with vascular surgery- back and forth here vs [facility]- was offered surgery initially for 80% L ICA stenosis, but then it was 70% on repeat imaging. Per vascular, would be a very difficult surgery anyway so they have elected to hold off, esp as she is asymptomatic   06/2023 underwent long hysterectomy without major issue (arterial line placed d/t NIBP artifact, some mild hypoxia likely d/t habitus in [place])- see anesthesia records)        Anesthesia Quick Evaluation

## 2023-11-25 NOTE — H&P (Signed)
 Office Visit Report     11/12/2023   --------------------------------------------------------------------------------   Penny Huerta  MRN: [id number]  DOB: Jul 23, 1966, 58 year old Female  SSN:    PRIMARY CARE:  Delbert Harness, MD  PRIMARY CARE FAX:  [phone]  REFERRING:  Delbert Harness, MD  PROVIDER:  Jerilee Field, M.D.  LOCATION:  [facility] [phone]     --------------------------------------------------------------------------------   CC/HPI: New pt -   1) urolithiasis - she did surveillance scan for endometrial. She required TAHx/BSO iin Aug 2024. No chemo or XRT. She underwent a December 2024 CT scan which revealed a 10 mm right lower pole stone and an 18 mm left lower pole stone. HU 640 SSD about 18 cm. Creatinine 0.6. UA with positive LE, positive N. Urine culture E. coli mostly sensitive-ceftriaxone, NF, Cipro, Bactrim, Doxy. No dysuria or gross hematuria.   She had never had kidney stones. Her dad had kidney stones. Her husband had stones. They had ESWL.   She had bypass in 2023. No OAC. EF 55-60. 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     ALLERGIES: Celexa Macrobid Sulfa Xanax    MEDICATIONS: No Medications    GU PSH: No GU PSH    NON-GU PSH: No Non-GU PSH    GU PMH: No GU PMH    NON-GU PMH: No Non-GU PMH    FAMILY HISTORY: No Family History    SOCIAL HISTORY: No Social History    REVIEW OF SYSTEMS:    GU Review Female:   Patient denies frequent urination, hard to postpone urination, burning/ pain with urination, get up at night to urinate, leakage of urine, stream starts and stops, trouble starting your stream, have to strain to urinate , erection problems, and penile pain.  Gastrointestinal (Upper):   Patient denies nausea, vomiting, and indigestion/ heartburn.  Gastrointestinal (Lower):   Patient denies diarrhea and constipation.  Constitutional:   Patient denies fever, night sweats, weight loss, and fatigue.  Skin:   Patient  denies skin rash/ lesion and itching.  Eyes:   Patient denies blurred vision and double vision.  Ears/ Nose/ Throat:   Patient denies sore throat and sinus problems.  Hematologic/Lymphatic:   Patient denies swollen glands and easy bruising.  Cardiovascular:   Patient denies leg swelling and chest pains.  Respiratory:   Patient denies cough and shortness of breath.  Endocrine:   Patient denies excessive thirst.  Musculoskeletal:   Patient denies back pain and joint pain.  Neurological:   Patient denies headaches and dizziness.  Psychologic:   Patient denies depression and anxiety.   VITAL SIGNS:      11/12/2023 02:56 PM  Weight 350 lb / 158.76 kg  Height 65 in / 165.1 cm  BP 130/87 mmHg  Pulse 109 /min  BMI 58.2 kg/m   MULTI-SYSTEM PHYSICAL EXAMINATION:    Constitutional: Well-nourished. No physical deformities. Normally developed. Good grooming.  Neck: Neck symmetrical, not swollen. Normal tracheal position.  Respiratory: No labored breathing, no use of accessory muscles.   Cardiovascular: Normal temperature, normal extremity pulses, no swelling, no varicosities.  Lymphatic: No enlargement of neck, axillae, groin.  Skin: No paleness, no jaundice, no cyanosis. No lesion, no ulcer, no rash.  Neurologic / Psychiatric: Oriented to time, oriented to place, oriented to person. No depression, no anxiety, no agitation.  Gastrointestinal: No mass, no tenderness, no rigidity, non obese abdomen.  Eyes: Normal conjunctivae. Normal eyelids.  Ears, Nose, Mouth, and Throat: Left ear no scars,  no lesions, no masses. Right ear no scars, no lesions, no masses. Nose no scars, no lesions, no masses. Normal hearing. Normal lips.  Musculoskeletal: Normal gait and station of head and neck.     Complexity of Data:  Urine Test Review:   Urine Culture  X-Ray Review: C.T. Abdomen/Pelvis: Reviewed Films. Discussed With Patient. 2024    PROCEDURES: None   ASSESSMENT:      ICD-10 Details  1 GU:   Renal  calculus - N20.0 Undiagnosed New Problem - I drew Penny Huerta and her daughter (EMS) a picture of the anatomy. They both done some research. We went over the nature risk benefits of continued surveillance, ureteroscopy or shockwave lithotripsy. All questions answered. She will proceed with ureteroscopy bilaterally and understands she may need a staged procedure.   PLAN:           Schedule Return Visit/Planned Activity: Next Available Appointment - Schedule Surgery          Document Letter(s):  Created for Patient: Clinical Summary         Notes:   cc: Dr. Earnest Bailey     * Signed by Jerilee Field, M.D. on 11/15/23 at 11:24 AM (EST)*      The information contained in this medical record document is considered private and confidential patient information. This information can only be used for the medical diagnosis and/or medical services that are being provided by the patient's selected caregivers. This information can only be distributed outside of the patient's care if the patient agrees and signs waivers of authorization for this information to be sent to an outside source or route.

## 2023-11-26 ENCOUNTER — Encounter (HOSPITAL_COMMUNITY)
Admission: RE | Disposition: A | Discharge status: Home / Self Care | Payer: Self-pay | Source: Ambulatory Visit | Attending: Urology

## 2023-11-26 ENCOUNTER — Other Ambulatory Visit: Payer: Self-pay

## 2023-11-26 ENCOUNTER — Ambulatory Visit (HOSPITAL_COMMUNITY)

## 2023-11-26 ENCOUNTER — Ambulatory Visit (HOSPITAL_COMMUNITY): Payer: Self-pay | Admitting: Anesthesiology

## 2023-11-26 ENCOUNTER — Ambulatory Visit (HOSPITAL_COMMUNITY)
Admission: RE | Admit: 2023-11-26 | Discharge: 2023-11-26 | Disposition: A | Discharge status: Home / Self Care | Source: Ambulatory Visit | Attending: Urology | Admitting: Urology

## 2023-11-26 ENCOUNTER — Encounter (HOSPITAL_COMMUNITY): Payer: Self-pay | Admitting: Urology

## 2023-11-26 ENCOUNTER — Ambulatory Visit (HOSPITAL_COMMUNITY): Payer: Self-pay | Admitting: Physician Assistant

## 2023-11-26 DIAGNOSIS — I2510 Atherosclerotic heart disease of native coronary artery without angina pectoris: Secondary | ICD-10-CM | POA: Diagnosis not present

## 2023-11-26 DIAGNOSIS — N200 Calculus of kidney: Secondary | ICD-10-CM | POA: Insufficient documentation

## 2023-11-26 DIAGNOSIS — E1151 Type 2 diabetes mellitus with diabetic peripheral angiopathy without gangrene: Secondary | ICD-10-CM | POA: Diagnosis not present

## 2023-11-26 DIAGNOSIS — Z01818 Encounter for other preprocedural examination: Secondary | ICD-10-CM

## 2023-11-26 DIAGNOSIS — Z87891 Personal history of nicotine dependence: Secondary | ICD-10-CM | POA: Diagnosis not present

## 2023-11-26 DIAGNOSIS — I10 Essential (primary) hypertension: Secondary | ICD-10-CM | POA: Insufficient documentation

## 2023-11-26 DIAGNOSIS — E669 Obesity, unspecified: Secondary | ICD-10-CM | POA: Diagnosis not present

## 2023-11-26 DIAGNOSIS — Z7984 Long term (current) use of oral hypoglycemic drugs: Secondary | ICD-10-CM | POA: Diagnosis not present

## 2023-11-26 DIAGNOSIS — Z6841 Body Mass Index (BMI) 40.0 and over, adult: Secondary | ICD-10-CM | POA: Insufficient documentation

## 2023-11-26 DIAGNOSIS — M1990 Unspecified osteoarthritis, unspecified site: Secondary | ICD-10-CM | POA: Insufficient documentation

## 2023-11-26 DIAGNOSIS — I4891 Unspecified atrial fibrillation: Secondary | ICD-10-CM | POA: Insufficient documentation

## 2023-11-26 DIAGNOSIS — Z79899 Other long term (current) drug therapy: Secondary | ICD-10-CM | POA: Diagnosis not present

## 2023-11-26 DIAGNOSIS — Z951 Presence of aortocoronary bypass graft: Secondary | ICD-10-CM | POA: Diagnosis not present

## 2023-11-26 HISTORY — PX: CYSTOSCOPY/URETEROSCOPY/HOLMIUM LASER/STENT PLACEMENT: SHX6546

## 2023-11-26 LAB — CBC
HCT: 42.4 % (ref 36.0–46.0)
Hemoglobin: 13 g/dL (ref 12.0–15.0)
MCH: 27.1 pg (ref 26.0–34.0)
MCHC: 30.7 g/dL (ref 30.0–36.0)
MCV: 88.3 fL (ref 80.0–100.0)
Platelets: 290 10*3/uL (ref 150–400)
RBC: 4.8 MIL/uL (ref 3.87–5.11)
RDW: 15.7 % — ABNORMAL HIGH (ref 11.5–15.5)
WBC: 9.1 10*3/uL (ref 4.0–10.5)
nRBC: 0 % (ref 0.0–0.2)

## 2023-11-26 LAB — GLUCOSE, CAPILLARY
Glucose-Capillary: 173 mg/dL — ABNORMAL HIGH (ref 70–99)
Glucose-Capillary: 144 mg/dL — ABNORMAL HIGH (ref 70–99)
Glucose-Capillary: 142 mg/dL — ABNORMAL HIGH (ref 70–99)

## 2023-11-26 LAB — BASIC METABOLIC PANEL
Anion gap: 12 (ref 5–15)
BUN: 20 mg/dL (ref 6–20)
CO2: 23 mmol/L (ref 22–32)
Calcium: 9.2 mg/dL (ref 8.9–10.3)
Chloride: 103 mmol/L (ref 98–111)
Creatinine, Ser: 0.64 mg/dL (ref 0.44–1.00)
GFR, Estimated: >60 mL/min (ref >60)
Glucose, Bld: 165 mg/dL — ABNORMAL HIGH (ref 70–99)
Potassium: 4 mmol/L (ref 3.5–5.1)
Sodium: 138 mmol/L (ref 135–145)

## 2023-11-26 SURGERY — CYSTOSCOPY/URETEROSCOPY/HOLMIUM LASER/STENT PLACEMENT
Anesthesia: General | Site: Ureter | Laterality: Bilateral

## 2023-11-26 MED ORDER — LACTATED RINGERS IV SOLN: INTRAVENOUS | Status: DC

## 2023-11-26 MED ORDER — PHENYLEPHRINE 80 MCG/ML (10ML) SYRINGE FOR IV PUSH (FOR BLOOD PRESSURE SUPPORT)
PREFILLED_SYRINGE | INTRAVENOUS | Status: DC | PRN
  Administered 2023-11-26: 80 ug via INTRAVENOUS

## 2023-11-26 MED ORDER — PHENYLEPHRINE HCL-NACL 20-0.9 MG/250ML-% IV SOLN
INTRAVENOUS | Status: AC
  Filled 2023-11-26: qty 250

## 2023-11-26 MED ORDER — ACETAMINOPHEN 500 MG PO TABS
1000.0000 mg | ORAL_TABLET | Freq: Once | ORAL | Status: AC
  Administered 2023-11-26: 1000 mg via ORAL
  Filled 2023-11-26: qty 2

## 2023-11-26 MED ORDER — ONDANSETRON HCL 4 MG/2ML IJ SOLN
INTRAMUSCULAR | Status: AC
  Filled 2023-11-26: qty 2

## 2023-11-26 MED ORDER — FENTANYL CITRATE (PF) 100 MCG/2ML IJ SOLN
INTRAMUSCULAR | Status: DC | PRN
  Administered 2023-11-26 (×4): 25 ug via INTRAVENOUS

## 2023-11-26 MED ORDER — AMISULPRIDE (ANTIEMETIC) 5 MG/2ML IV SOLN: 10.0000 mg | Freq: Once | INTRAVENOUS | Status: DC | PRN

## 2023-11-26 MED ORDER — INSULIN ASPART 100 UNIT/ML IJ SOLN
0.0000 [IU] | INTRAMUSCULAR | Status: AC | PRN
Start: 2023-11-26 — End: 2023-11-26
  Administered 2023-11-26 (×2): 2 [IU] via SUBCUTANEOUS
  Filled 2023-11-26: qty 1

## 2023-11-26 MED ORDER — PROPOFOL 10 MG/ML IV BOLUS
INTRAVENOUS | Status: AC
  Filled 2023-11-26: qty 20

## 2023-11-26 MED ORDER — SODIUM CHLORIDE 0.9 % IV SOLN
2.0000 g | INTRAVENOUS | Status: AC
  Administered 2023-11-26: 2 g via INTRAVENOUS
  Filled 2023-11-26: qty 20

## 2023-11-26 MED ORDER — LIDOCAINE HCL (PF) 2 % IJ SOLN
INTRAMUSCULAR | Status: DC | PRN
Start: 2023-11-26 — End: 2023-11-26
  Administered 2023-11-26: 100 mg via INTRADERMAL

## 2023-11-26 MED ORDER — SODIUM CHLORIDE 0.9 % IR SOLN
Status: DC | PRN
  Administered 2023-11-26: 6000 mL via INTRAVESICAL

## 2023-11-26 MED ORDER — ONDANSETRON HCL 4 MG/2ML IJ SOLN
INTRAMUSCULAR | Status: DC | PRN
  Administered 2023-11-26: 4 mg via INTRAVENOUS

## 2023-11-26 MED ORDER — IOHEXOL 300 MG/ML  SOLN
INTRAMUSCULAR | Status: DC | PRN
Start: 2023-11-26 — End: 2023-11-26
  Administered 2023-11-26: 15 mL

## 2023-11-26 MED ORDER — KETOROLAC TROMETHAMINE 30 MG/ML IJ SOLN
INTRAMUSCULAR | Status: AC
  Filled 2023-11-26: qty 1

## 2023-11-26 MED ORDER — ONDANSETRON HCL 8 MG PO TABS: 8.0000 mg | ORAL_TABLET | Freq: Three times a day (TID) | ORAL | 0 refills | Status: DC | PRN

## 2023-11-26 MED ORDER — KETOROLAC TROMETHAMINE 30 MG/ML IJ SOLN
INTRAMUSCULAR | Status: DC | PRN
  Administered 2023-11-26: 30 mg via INTRAVENOUS

## 2023-11-26 MED ORDER — ROCURONIUM BROMIDE 10 MG/ML (PF) SYRINGE
PREFILLED_SYRINGE | INTRAVENOUS | Status: AC
  Filled 2023-11-26: qty 10

## 2023-11-26 MED ORDER — ONDANSETRON HCL 4 MG/2ML IJ SOLN: 4.0000 mg | Freq: Once | INTRAMUSCULAR | Status: DC | PRN

## 2023-11-26 MED ORDER — ORAL CARE MOUTH RINSE: 15.0000 mL | Freq: Once | OROMUCOSAL | Status: AC

## 2023-11-26 MED ORDER — CHLORHEXIDINE GLUCONATE 0.12 % MT SOLN
15.0000 mL | Freq: Once | OROMUCOSAL | Status: AC
  Administered 2023-11-26: 15 mL via OROMUCOSAL

## 2023-11-26 MED ORDER — MIDAZOLAM HCL 2 MG/2ML IJ SOLN
INTRAMUSCULAR | Status: AC
  Filled 2023-11-26: qty 2

## 2023-11-26 MED ORDER — PROPOFOL 10 MG/ML IV BOLUS
INTRAVENOUS | Status: DC | PRN
  Administered 2023-11-26: 200 mg via INTRAVENOUS

## 2023-11-26 MED ORDER — MIDAZOLAM HCL 5 MG/5ML IJ SOLN
INTRAMUSCULAR | Status: DC | PRN
  Administered 2023-11-26: 2 mg via INTRAVENOUS

## 2023-11-26 MED ORDER — LIDOCAINE HCL (PF) 2 % IJ SOLN
INTRAMUSCULAR | Status: AC
  Filled 2023-11-26: qty 5

## 2023-11-26 MED ORDER — PHENYLEPHRINE 80 MCG/ML (10ML) SYRINGE FOR IV PUSH (FOR BLOOD PRESSURE SUPPORT)
PREFILLED_SYRINGE | INTRAVENOUS | Status: AC
  Filled 2023-11-26: qty 10

## 2023-11-26 MED ORDER — NITROFURANTOIN MACROCRYSTAL 50 MG PO CAPS
50.0000 mg | ORAL_CAPSULE | Freq: Every day | ORAL | 0 refills | Status: DC
Start: 2023-11-26 — End: 2024-01-04

## 2023-11-26 MED ORDER — FENTANYL CITRATE PF 50 MCG/ML IJ SOSY: 25.0000 ug | PREFILLED_SYRINGE | INTRAMUSCULAR | Status: DC | PRN

## 2023-11-26 MED ORDER — FENTANYL CITRATE (PF) 100 MCG/2ML IJ SOLN
INTRAMUSCULAR | Status: AC
  Filled 2023-11-26: qty 2

## 2023-11-26 SURGICAL SUPPLY — 19 items
BAG URO CATCHER STRL LF (MISCELLANEOUS) ×1 IMPLANT
BASKET ZERO TIP NITINOL 2.4FR (BASKET) IMPLANT
CATH URETL OPEN END 6FR 70 (CATHETERS) ×1 IMPLANT
CLOTH BEACON ORANGE TIMEOUT ST (SAFETY) ×1 IMPLANT
EXTRACTOR STONE NITINOL NGAGE (UROLOGICAL SUPPLIES) IMPLANT
FIBER LASER MOSES 200 DFL (Laser) IMPLANT
FIBER LASER MOSES 365 DFL (Laser) IMPLANT
GLOVE BIO SURGEON STRL SZ7.5 (GLOVE) ×1 IMPLANT
GOWN STRL REUS W/ TWL XL LVL3 (GOWN DISPOSABLE) ×1 IMPLANT
GUIDEWIRE STR DUAL SENSOR (WIRE) ×1 IMPLANT
GUIDEWIRE ZIPWRE .038 STRAIGHT (WIRE) IMPLANT
KIT TURNOVER KIT A (KITS) IMPLANT
MANIFOLD NEPTUNE II (INSTRUMENTS) ×1 IMPLANT
PACK CYSTO (CUSTOM PROCEDURE TRAY) ×1 IMPLANT
SHEATH NAVIGATOR HD 11/13X28 (SHEATH) IMPLANT
SHEATH NAVIGATOR HD 11/13X36 (SHEATH) IMPLANT
STENT URET 6FRX24 CONTOUR (STENTS) IMPLANT
TUBING CONNECTING 10 (TUBING) ×1 IMPLANT
TUBING UROLOGY SET (TUBING) ×1 IMPLANT

## 2023-11-26 NOTE — Transfer of Care (Signed)
 Immediate Anesthesia Transfer of Care Note  Patient: Penny Huerta  Procedure(s) Performed: CYSTOSCOPY/LEFT URETEROSCOPY LEFT HOLMIUM LASER RIGHT RETROGRADE PYELOGRAM BILATERAL URETERAL STENT PLACEMENT (Bilateral: Ureter)  Patient Location: PACU  Anesthesia Type:General  Level of Consciousness: awake and alert   Airway & Oxygen Therapy: Patient Spontanous Breathing and Patient connected to face mask oxygen  Post-op Assessment: Report given to RN and Post -op Vital signs reviewed and stable  Post vital signs: Reviewed and stable  Last Vitals:  Vitals Value Taken Time  BP 159/98 11/26/23 0926  Temp    Pulse    Resp 16 11/26/23 0928  SpO2    Vitals shown include unfiled device data.  Last Pain:  Vitals:   11/26/23 0551  TempSrc:   PainSc: 0-No pain         Complications: No notable events documented.

## 2023-11-26 NOTE — Op Note (Signed)
 Preoperative diagnosis: Bilateral lower pole stones  Postoperative diagnosis: Same  Seizure: Cystoscopy, bilateral retrograde pyelogram, bilateral ureteral stent placement; left ureteroscopy laser lithotripsy  Surgeon: Mena Goes  Anesthesia: General  Indication for procedure: Penny Huerta is a 58 year old female with bilateral lower pole stones with a 10 mm in the right lower pole and an 18 mm in the left lower pole.  Given the stone size and location she elected for ureteroscopy and lieu of surveillance.  Findings: The urethra and the bladder were unremarkable.  No stone or foreign body in the bladder.  Ureteral orifice ease in their normal orthotopic position with clear E flux.  Left retrograde pyelogram-this outlined a single ureter single collecting system unit with a filling defect in the lower pole consistent with the stone.  Right retrograde pyelogram-this outlined a single ureter single collecting system unit with a filling defect in the lower pole consistent with the stone.  The right kidney was more laterally and inferiorly rotated.  On left ureteroscopy the stone was located but I could only shave off the top path and not access the lower half given the angle.  Given that we need to come back for the right side, left a stent on the left to take a second look in the future.  The right ureter was tight and I could not get a short access sheath up over the iliacs.  Therefore this side was stented for a staged procedure.  Description of procedure: After consent was obtained patient brought to the operating room.  After adequate anesthesia she is placed in lithotomy position and prepped and draped in the usual sterile fashion.  Timeout was performed to confirm the patient and procedure.  Cystoscope was passed per urethra and the bladder inspected.  The left ureteral orifice was cannulated with a 6 [place] open-ended catheter and left retrograde injection of contrast was performed.  I then advanced  a sensor wire and coiled that in the midpole calyx.  I then used a short access sheath to get 2 wires in place.  I left the zip wire as the safety and replaced the access sheath.  Access sheath went without difficulty.  I then passed  single channel digital ureteroscope (labeled as dual channel).  The lower pole stone was noted and I passed the 242 m laser fiber and began to dust the stone.  I was able to get the top third or half shaved off and could just see the edge of the lower part but could not access it due to the angle.  Therefore I passed an engage basket was able to grasp the top of the stone but could not quite pull it out of the infundibulum.  Also passed a 0 tip basket and could not get under it and ensnare it.  Therefore I swapped out to a dual channel digital ureteroscope to give better water flow.  I repassed the laser through the top port.  I began to come down on the stone and got a little bit more off.  I tried to manipulate again with the engage and there is 0 tip but could not access the stone any further.  Given failure to progress I backed the access sheath out on the ureteroscope carefully inspected the collecting system renal pelvis and ureter on the way out and there was no injury.  The cystoscope was replaced and the right ureteral orifice was cannulated with a 6 [place] open-ended catheter and retrograde injection of contrast was performed.  A  sensor wire was advanced and coiled in the upper calyx.  I then passed the inner part of the access sheath that went without difficulty and then tried to use the access sheath to get 2 wires in place but I could not get the access sheath up over the iliacs.  I held some steady pressure but again could not get the access sheath to advance.  Therefore I decided to stent the right side for staged procedure.  The wire was backloaded on the cystoscope and a 624 cm stent advanced up to the right side and the wire was removed with good coil seen up in  the collecting system and a good coil in the bladder.  Given a staged procedure will stent the left side and plan to take a second look here as well.  The wire was backloaded on the cystoscope and a 624 cm stent was advanced up the left side.  The wire was removed and a good coil seen in the collecting system and good coil in the bladder.  The bladder was drained and the scope removed.  She was awakened taken the cover room in stable condition.  Complications: None  Blood loss: Minimal  Specimens: None  Drains: Bilateral 6 x 24 cm ureteral stents  Disposition: Patient stable to PACU-I discussed the procedure, postop care and follow-up with Greig Castilla.

## 2023-11-26 NOTE — Interval H&P Note (Signed)
 History and Physical Interval Note:  11/26/2023 7:27 AM  Penny Huerta  has presented today for surgery, with the diagnosis of BILATERAL RENAL STONES.  The various methods of treatment have been discussed with the patient and family. After consideration of risks, benefits and other options for treatment, the patient has consented to  Procedure(s): CYSTOSCOPY/BILATERAL URETEROSCOPY/HOLMIUM LASER/STENT PLACEMENT (Bilateral) as a surgical intervention.  The patient's history has been reviewed, patient examined, no change in status, stable for surgery.  I have reviewed the patient's chart and labs. She is well with no fever or dysuria. No gross hematuria. No congestions. Allergy cough chronic. Discussed again she may need a staged procedure and stent management.  Questions were answered to the patient's satisfaction.     Jerilee Field

## 2023-11-26 NOTE — Anesthesia Procedure Notes (Signed)
 Procedure Name: LMA Insertion Date/Time: 11/26/2023 7:45 AM  Performed by: Florene Route, CRNAPre-anesthesia Checklist: Patient identified, Suction available, Emergency Drugs available and Patient being monitored Patient Re-evaluated:Patient Re-evaluated prior to induction Oxygen Delivery Method: Circle system utilized Preoxygenation: Pre-oxygenation with 100% oxygen Induction Type: IV induction LMA: LMA inserted LMA Size: 5.0 Number of attempts: 1 Placement Confirmation: positive ETCO2 and breath sounds checked- equal and bilateral Tube secured with: Tape Dental Injury: Teeth and Oropharynx as per pre-operative assessment  Comments: Inserted by Estill Bamberg

## 2023-11-28 NOTE — Anesthesia Postprocedure Evaluation (Signed)
 Anesthesia Post Note  Patient: Penny Huerta  Procedure(s) Performed: CYSTOSCOPY/LEFT URETEROSCOPY LEFT HOLMIUM LASER RIGHT RETROGRADE PYELOGRAM BILATERAL URETERAL STENT PLACEMENT (Bilateral: Ureter)     Patient location during evaluation: PACU Anesthesia Type: General Level of consciousness: awake and alert Pain management: pain level controlled Vital Signs Assessment: post-procedure vital signs reviewed and stable Respiratory status: spontaneous breathing, nonlabored ventilation and respiratory function stable Cardiovascular status: blood pressure returned to baseline and stable Postop Assessment: no apparent nausea or vomiting Anesthetic complications: no   No notable events documented.  Last Vitals:  Vitals:   11/26/23 0945 11/26/23 1005  BP: 122/70 126/70  Pulse: 60 60  Resp: 13 12  Temp:  36.6 C  SpO2: 91% 95%    Last Pain:  Vitals:   11/26/23 1005  TempSrc:   PainSc: 0-No pain                 Collene Schlichter

## 2023-11-29 ENCOUNTER — Other Ambulatory Visit: Payer: Self-pay | Admitting: Urology

## 2023-11-29 ENCOUNTER — Encounter (HOSPITAL_COMMUNITY): Payer: Self-pay | Admitting: Urology

## 2023-12-10 ENCOUNTER — Encounter: Payer: Self-pay | Admitting: Internal Medicine

## 2023-12-10 NOTE — Telephone Encounter (Signed)
 Spoke with pt regarding her symptoms. Pt stated she has been seeing "lightning bolts" in her right eye. The "lightning bolts" were consistently occurring this morning but since about 12 pm today they have started to occur less. Pt states she has regular visual disturbances and believes they are due to migraines, however, the pt stated she does not usually have a headache. Pt denies having a headache today, weakness, numbness, tingling, confusion, dizziness or loss of balance. Pt did not have any recent blood pressures or heart rates to share. Pt was advised to contact her ophthalmologist as her symptoms do not suggest a cardiac issue. Pt was told that should she begin to have weakness, loss of balance, dizziness, slurred speech, confusion or other stroke symptoms she should report to the ED. Pt verbalized understanding. All questions, if any, were answered.

## 2023-12-21 NOTE — Patient Instructions (Signed)
 SURGICAL WAITING ROOM VISITATION  Patients having surgery or a procedure may have no more than 2 support people in the waiting area - these visitors may rotate.    Children under the age of 63 must have an adult with them who is not the patient.  Due to an increase in RSV and influenza rates and associated hospitalizations, children ages 8 and under may not visit patients in [facility] hospitals.  Visitors with respiratory illnesses are discouraged from visiting and should remain at home.  If the patient needs to stay at the hospital during part of their recovery, the visitor guidelines for inpatient rooms apply. Pre-op nurse will coordinate an appropriate time for 1 support person to accompany patient in pre-op.  This support person may not rotate.    Please refer to the [facility] website for the visitor guidelines for Inpatients (after your surgery is over and you are in a regular room).    Your procedure is scheduled on: 01/04/24   Report to [facility] Main Entrance    Report to admitting at 5:15 AM   Call this number if you have problems the morning of surgery [phone]   Do not eat food or drink liquids :After Midnight.          If you have questions, please contact your surgeon's office.   FOLLOW BOWEL PREP AND ANY ADDITIONAL PRE OP INSTRUCTIONS YOU RECEIVED FROM YOUR SURGEON'S OFFICE!!!     Oral Hygiene is also important to reduce your risk of infection.                                    Remember - BRUSH YOUR TEETH THE MORNING OF SURGERY WITH YOUR REGULAR TOOTHPASTE  DENTURES WILL BE REMOVED PRIOR TO SURGERY PLEASE DO NOT APPLY "Poly grip" OR ADHESIVES!!!   Stop all vitamins and herbal supplements 7 days before surgery.   Take these medicines the morning of surgery with A SIP OF WATER: Zetia, Metoprolol, Sertraline   DO NOT TAKE ANY ORAL DIABETIC MEDICATIONS DAY OF YOUR SURGERY  How to Manage Your Diabetes Before and After Surgery  Why is it  important to control my blood sugar before and after surgery? Improving blood sugar levels before and after surgery helps healing and can limit problems. A way of improving blood sugar control is eating a healthy diet by:  Eating less sugar and carbohydrates  Increasing activity/exercise  Talking with your doctor about reaching your blood sugar goals High blood sugars (greater than 180 mg/dL) can raise your risk of infections and slow your recovery, so you will need to focus on controlling your diabetes during the weeks before surgery. Make sure that the doctor who takes care of your diabetes knows about your planned surgery including the date and location.  How do I manage my blood sugar before surgery? Check your blood sugar at least 4 times a day, starting 2 days before surgery, to make sure that the level is not too high or low. Check your blood sugar the morning of your surgery when you wake up and every 2 hours until you get to the Short Stay unit. If your blood sugar is less than 70 mg/dL, you will need to treat for low blood sugar: Do not take insulin. Treat a low blood sugar (less than 70 mg/dL) with  cup of clear juice (cranberry or apple), 4 glucose tablets, OR glucose  gel. Recheck blood sugar in 15 minutes after treatment (to make sure it is greater than 70 mg/dL). If your blood sugar is not greater than 70 mg/dL on recheck, call [phone] for further instructions. Report your blood sugar to the short stay nurse when you get to Short Stay.  If you are admitted to the hospital after surgery: Your blood sugar will be checked by the staff and you will probably be given insulin after surgery (instead of oral diabetes medicines) to make sure you have good blood sugar levels. The goal for blood sugar control after surgery is 80-180 mg/dL.   WHAT DO I DO ABOUT MY DIABETES MEDICATION?  Do not take oral diabetes medicines (pills) the morning of surgery.  Hold Jardiance for 3 days  prior to surgery. Last dose 01/01/24.  THE DAY BEFORE SURGERY, take Metformin as prescribed.     THE MORNING OF SURGERY, do not take Metformin.   Reviewed and Endorsed by [facility] Patient Education Committee, August 2015                              You may not have any metal on your body including hair pins, jewelry, and body piercing             Do not wear make-up, lotions, powders, perfumes, or deodorant  Do not wear nail polish including gel and S&S, artificial/acrylic nails, or any other type of covering on natural nails including finger and toenails. If you have artificial nails, gel coating, etc. that needs to be removed by a nail salon please have this removed prior to surgery or surgery may need to be canceled/ delayed if the surgeon/ anesthesia feels like they are unable to be safely monitored.   Do not shave  48 hours prior to surgery.    Do not bring valuables to the hospital. [facility] IS NOT             RESPONSIBLE   FOR VALUABLES.   Contacts, glasses, dentures or bridgework may not be worn into surgery.  DO NOT BRING YOUR HOME MEDICATIONS TO THE HOSPITAL. PHARMACY WILL DISPENSE MEDICATIONS LISTED ON YOUR MEDICATION LIST TO YOU DURING YOUR ADMISSION IN THE HOSPITAL!    Patients discharged on the day of surgery will not be allowed to drive home.  Someone NEEDS to stay with you for the first 24 hours after anesthesia.   Special Instructions: Bring a copy of your healthcare power of attorney and living will documents the day of surgery if you haven't scanned them before.              Please read over the following fact sheets you were given: IF YOU HAVE QUESTIONS ABOUT YOUR PRE-OP INSTRUCTIONS PLEASE CALL [phone] Contras    If you received a COVID test during your pre-op visit  it is requested that you wear a mask when out in public, stay away from anyone that may not be feeling well and notify your surgeon if you develop symptoms. If you test positive for Covid or  have been in contact with anyone that has tested positive in the last 10 days please notify you surgeon.    [facility] - Preparing for Surgery Before surgery, you can play an important role.  Because skin is not sterile, your skin needs to be as free of germs as possible.  You can reduce the number of germs on your skin by  washing with CHG (chlorahexidine gluconate) soap before surgery.  CHG is an antiseptic cleaner which kills germs and bonds with the skin to continue killing germs even after washing. Please DO NOT use if you have an allergy to CHG or antibacterial soaps.  If your skin becomes reddened/irritated stop using the CHG and inform your nurse when you arrive at Short Stay. Do not shave (including legs and underarms) for at least 48 hours prior to the first CHG shower.  You may shave your face/neck.  Please follow these instructions carefully:  1.  Shower with CHG Soap the night before surgery and the  morning of surgery.  2.  If you choose to wash your hair, wash your hair first as usual with your normal  shampoo.  3.  After you shampoo, rinse your hair and body thoroughly to remove the shampoo.                             4.  Use CHG as you would any other liquid soap.  You can apply chg directly to the skin and wash.  Gently with a scrungie or clean washcloth.  5.  Apply the CHG Soap to your body ONLY FROM THE NECK DOWN.   Do   not use on face/ open                           Wound or open sores. Avoid contact with eyes, ears mouth and   genitals (private parts).                       Wash face,  Genitals (private parts) with your normal soap.             6.  Wash thoroughly, paying special attention to the area where your    surgery  will be performed.  7.  Thoroughly rinse your body with warm water from the neck down.  8.  DO NOT shower/wash with your normal soap after using and rinsing off the CHG Soap.                9.  Pat yourself dry with a clean towel.            10.  Wear  clean pajamas.            11.  Place clean sheets on your bed the night of your first shower and do not  sleep with pets. Day of Surgery : Do not apply any lotions/deodorants the morning of surgery.  Please wear clean clothes to the hospital/surgery center.  FAILURE TO FOLLOW THESE INSTRUCTIONS MAY RESULT IN THE CANCELLATION OF YOUR SURGERY  PATIENT SIGNATURE_________________________________  NURSE SIGNATURE__________________________________  ________________________________________________________________________

## 2023-12-21 NOTE — Progress Notes (Signed)
 COVID Vaccine Completed:  Date of COVID positive in last 90 days:  PCP - Delbert Harness, MD Cardiologist - Alverda Skeans, MD  Chest x-ray -  EKG - 06/23/23 Epic Stress Test -  ECHO - 07/02/23 Epic Cardiac Cath - 12/05/21 Epic Pacemaker/ICD device last checked: Spinal Cord Stimulator:  Bowel Prep -   Sleep Study -  CPAP -   Fasting Blood Sugar -  Checks Blood Sugar _____ times a day  Last dose of GLP1 agonist-  N/A GLP1 instructions:  Hold 7 days before surgery    Last dose of SGLT-2 inhibitors-  N/A SGLT-2 instructions:  Hold 3 days before surgery    Blood Thinner Instructions:  Last dose:   Time: Aspirin Instructions: ASA 81 Last Dose:  Activity level:  Can go up a flight of stairs and perform activities of daily living without stopping and without symptoms of chest pain or shortness of breath.  Able to exercise without symptoms  Unable to go up a flight of stairs without symptoms of     Anesthesia review: HTN, TIA, CAD, CABG x4, stroke, DM2  Patient denies shortness of breath, fever, cough and chest pain at PAT appointment  Patient verbalized understanding of instructions that were given to them at the PAT appointment. Patient was also instructed that they will need to review over the PAT instructions again at home before surgery.

## 2023-12-22 ENCOUNTER — Other Ambulatory Visit: Payer: Self-pay

## 2023-12-22 ENCOUNTER — Encounter (HOSPITAL_COMMUNITY)
Admission: RE | Admit: 2023-12-22 | Discharge: 2023-12-22 | Disposition: A | Discharge status: Home / Self Care | Source: Ambulatory Visit | Attending: Urology | Admitting: Urology

## 2023-12-22 ENCOUNTER — Encounter (HOSPITAL_COMMUNITY): Payer: Self-pay

## 2023-12-22 VITALS — BP 129/80 | HR 73 | Temp 98.1°F | Resp 16 | Ht 65.0 in | Wt 350.0 lb

## 2023-12-22 DIAGNOSIS — E1165 Type 2 diabetes mellitus with hyperglycemia: Secondary | ICD-10-CM | POA: Insufficient documentation

## 2023-12-22 DIAGNOSIS — Z01812 Encounter for preprocedural laboratory examination: Secondary | ICD-10-CM | POA: Diagnosis present

## 2023-12-22 LAB — BASIC METABOLIC PANEL WITH GFR
Anion gap: 10 (ref 5–15)
BUN: 20 mg/dL (ref 6–20)
CO2: 24 mmol/L (ref 22–32)
Calcium: 9.2 mg/dL (ref 8.9–10.3)
Chloride: 104 mmol/L (ref 98–111)
Creatinine, Ser: 0.73 mg/dL (ref 0.44–1.00)
GFR, Estimated: >60 mL/min (ref >60)
Glucose, Bld: 147 mg/dL — ABNORMAL HIGH (ref 70–99)
Potassium: 4.9 mmol/L (ref 3.5–5.1)
Sodium: 138 mmol/L (ref 135–145)

## 2023-12-22 LAB — CBC
HCT: 39.7 % (ref 36.0–46.0)
Hemoglobin: 12.1 g/dL (ref 12.0–15.0)
MCH: 26.7 pg (ref 26.0–34.0)
MCHC: 30.5 g/dL (ref 30.0–36.0)
MCV: 87.6 fL (ref 80.0–100.0)
Platelets: 267 10*3/uL (ref 150–400)
RBC: 4.53 MIL/uL (ref 3.87–5.11)
RDW: 16.2 % — ABNORMAL HIGH (ref 11.5–15.5)
WBC: 7.7 10*3/uL (ref 4.0–10.5)
nRBC: 0 % (ref 0.0–0.2)

## 2023-12-22 LAB — GLUCOSE, CAPILLARY: Glucose-Capillary: 151 mg/dL — ABNORMAL HIGH (ref 70–99)

## 2023-12-23 LAB — HEMOGLOBIN A1C
Hgb A1c MFr Bld: 7.2 % — ABNORMAL HIGH (ref 4.8–5.6)
Mean Plasma Glucose: 160 mg/dL

## 2024-01-02 ENCOUNTER — Encounter: Payer: Self-pay | Admitting: Psychiatry

## 2024-01-03 ENCOUNTER — Telehealth: Payer: Self-pay

## 2024-01-03 NOTE — Anesthesia Preprocedure Evaluation (Signed)
 Anesthesia Evaluation  Patient identified by MRN, date of birth, ID band Patient awake    Reviewed: Allergy & Precautions, NPO status , Patient's Chart, lab work & pertinent test results  History of Anesthesia Complications Negative for: history of anesthetic complications  Airway Mallampati: II  TM Distance: >3 FB Neck ROM: Full    Dental  (+) Missing,    Pulmonary former smoker   Pulmonary exam normal        Cardiovascular hypertension, Pt. on medications and Pt. 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  Reproductive/Obstetrics                              Anesthesia Physical Anesthesia Plan  ASA: 3  Anesthesia Plan: General   Post-op Pain Management: Tylenol  PO (pre-op)*   Induction: Intravenous  PONV Risk Score and Plan: 3 and Ondansetron , Dexamethasone , Treatment may vary due to age or medical condition and Midazolam   Airway Management Planned: Oral ETT  Additional Equipment: None  Intra-op Plan:   Post-operative Plan: Extubation in OR  Informed Consent: I have reviewed the patients History and Physical, chart, labs and discussed the procedure including the risks, benefits and alternatives for the proposed anesthesia with the patient or authorized representative who has indicated his/her understanding and acceptance.     Dental advisory given  Plan Discussed with: CRNA  Anesthesia Plan Comments:          Anesthesia Quick Evaluation

## 2024-01-03 NOTE — Telephone Encounter (Addendum)
 Pt called back wanting to be seen on Wednesday 4/23 with Dr.Jackson-Moore, appointment scheduled for 3:45.     I spoke to Ms.Ludlum regarding the Mychart message she sent over the weekend.   Pt states on Saturday she noticed 3 spots of red blood on a pad, reports it was a one time incident. She was not doing anything prior to spotting, no heavy lifting, straining and no intercourse. Reports no vaginal discharge, no cramping/pain. It did not saturate a pad, no clots. She is not on any HRT treatment. Pt states she is having surgery tomorrow to get ureter stents removed, but she does not think it was coming from the ureter.   Pt declined an appointment for evaluation with Dr.Jackson-Moore on Wednesday 4/23, she states she will monitor and if gets worse will call back. She has a follow up appointment with Dr.Newton on Monday 5/5,

## 2024-01-04 ENCOUNTER — Ambulatory Visit (HOSPITAL_COMMUNITY): Payer: Self-pay | Admitting: Physician Assistant

## 2024-01-04 ENCOUNTER — Encounter (HOSPITAL_COMMUNITY)
Admission: RE | Disposition: A | Discharge status: Home / Self Care | Payer: Self-pay | Source: Ambulatory Visit | Attending: Urology

## 2024-01-04 ENCOUNTER — Ambulatory Visit (HOSPITAL_COMMUNITY)
Admission: RE | Admit: 2024-01-04 | Discharge: 2024-01-04 | Disposition: A | Discharge status: Home / Self Care | Source: Ambulatory Visit | Attending: Urology | Admitting: Urology

## 2024-01-04 ENCOUNTER — Ambulatory Visit (HOSPITAL_COMMUNITY)

## 2024-01-04 ENCOUNTER — Other Ambulatory Visit: Payer: Self-pay

## 2024-01-04 ENCOUNTER — Encounter (HOSPITAL_COMMUNITY): Payer: Self-pay | Admitting: Urology

## 2024-01-04 DIAGNOSIS — I2510 Atherosclerotic heart disease of native coronary artery without angina pectoris: Secondary | ICD-10-CM | POA: Insufficient documentation

## 2024-01-04 DIAGNOSIS — Z6841 Body Mass Index (BMI) 40.0 and over, adult: Secondary | ICD-10-CM | POA: Insufficient documentation

## 2024-01-04 DIAGNOSIS — Z8673 Personal history of transient ischemic attack (TIA), and cerebral infarction without residual deficits: Secondary | ICD-10-CM | POA: Insufficient documentation

## 2024-01-04 DIAGNOSIS — Z87891 Personal history of nicotine dependence: Secondary | ICD-10-CM | POA: Insufficient documentation

## 2024-01-04 DIAGNOSIS — F419 Anxiety disorder, unspecified: Secondary | ICD-10-CM | POA: Diagnosis not present

## 2024-01-04 DIAGNOSIS — I10 Essential (primary) hypertension: Secondary | ICD-10-CM | POA: Diagnosis not present

## 2024-01-04 DIAGNOSIS — E119 Type 2 diabetes mellitus without complications: Secondary | ICD-10-CM | POA: Insufficient documentation

## 2024-01-04 DIAGNOSIS — F32A Depression, unspecified: Secondary | ICD-10-CM | POA: Insufficient documentation

## 2024-01-04 DIAGNOSIS — E1165 Type 2 diabetes mellitus with hyperglycemia: Secondary | ICD-10-CM

## 2024-01-04 DIAGNOSIS — Z951 Presence of aortocoronary bypass graft: Secondary | ICD-10-CM | POA: Insufficient documentation

## 2024-01-04 DIAGNOSIS — E6689 Other obesity not elsewhere classified: Secondary | ICD-10-CM | POA: Diagnosis not present

## 2024-01-04 DIAGNOSIS — Z7984 Long term (current) use of oral hypoglycemic drugs: Secondary | ICD-10-CM | POA: Insufficient documentation

## 2024-01-04 DIAGNOSIS — N200 Calculus of kidney: Secondary | ICD-10-CM | POA: Diagnosis present

## 2024-01-04 HISTORY — PX: CYSTOSCOPY/URETEROSCOPY/HOLMIUM LASER/STENT PLACEMENT: SHX6546

## 2024-01-04 LAB — GLUCOSE, CAPILLARY
Glucose-Capillary: 177 mg/dL — ABNORMAL HIGH (ref 70–99)
Glucose-Capillary: 149 mg/dL — ABNORMAL HIGH (ref 70–99)
Glucose-Capillary: 152 mg/dL — ABNORMAL HIGH (ref 70–99)

## 2024-01-04 SURGERY — CYSTOSCOPY/URETEROSCOPY/HOLMIUM LASER/STENT PLACEMENT
Anesthesia: General | Laterality: Bilateral

## 2024-01-04 MED ORDER — CHLORHEXIDINE GLUCONATE 0.12 % MT SOLN
15.0000 mL | Freq: Once | OROMUCOSAL | Status: AC
  Administered 2024-01-04: 15 mL via OROMUCOSAL

## 2024-01-04 MED ORDER — LIDOCAINE HCL (CARDIAC) PF 100 MG/5ML IV SOSY
PREFILLED_SYRINGE | INTRAVENOUS | Status: DC | PRN
  Administered 2024-01-04: 100 mg via INTRAVENOUS

## 2024-01-04 MED ORDER — ONDANSETRON HCL 4 MG/2ML IJ SOLN
INTRAMUSCULAR | Status: DC | PRN
  Administered 2024-01-04: 4 mg via INTRAVENOUS

## 2024-01-04 MED ORDER — ROCURONIUM BROMIDE 10 MG/ML (PF) SYRINGE
PREFILLED_SYRINGE | INTRAVENOUS | Status: AC
  Filled 2024-01-04: qty 10

## 2024-01-04 MED ORDER — PROPOFOL 10 MG/ML IV BOLUS
INTRAVENOUS | Status: AC
  Filled 2024-01-04: qty 20

## 2024-01-04 MED ORDER — MIDAZOLAM HCL 5 MG/5ML IJ SOLN
INTRAMUSCULAR | Status: DC | PRN
  Administered 2024-01-04: 2 mg via INTRAVENOUS

## 2024-01-04 MED ORDER — INSULIN ASPART 100 UNIT/ML IJ SOLN
INTRAMUSCULAR | Status: AC
  Administered 2024-01-04: 2 [IU] via SUBCUTANEOUS
  Filled 2024-01-04: qty 1

## 2024-01-04 MED ORDER — SUCCINYLCHOLINE CHLORIDE 200 MG/10ML IV SOSY
PREFILLED_SYRINGE | INTRAVENOUS | Status: DC | PRN
  Administered 2024-01-04: 200 mg via INTRAVENOUS

## 2024-01-04 MED ORDER — ROCURONIUM BROMIDE 100 MG/10ML IV SOLN
INTRAVENOUS | Status: DC | PRN
  Administered 2024-01-04: 10 mg via INTRAVENOUS
  Administered 2024-01-04 (×2): 20 mg via INTRAVENOUS
  Administered 2024-01-04: 10 mg via INTRAVENOUS

## 2024-01-04 MED ORDER — FENTANYL CITRATE (PF) 100 MCG/2ML IJ SOLN
INTRAMUSCULAR | Status: AC
  Filled 2024-01-04: qty 2

## 2024-01-04 MED ORDER — OXYCODONE HCL 5 MG PO TABS
ORAL_TABLET | ORAL | Status: AC
  Filled 2024-01-04: qty 1

## 2024-01-04 MED ORDER — ONDANSETRON HCL 4 MG/2ML IJ SOLN
INTRAMUSCULAR | Status: AC
  Filled 2024-01-04: qty 2

## 2024-01-04 MED ORDER — OXYCODONE HCL 5 MG PO TABS
5.0000 mg | ORAL_TABLET | Freq: Once | ORAL | Status: AC | PRN
  Administered 2024-01-04: 5 mg via ORAL

## 2024-01-04 MED ORDER — SUGAMMADEX SODIUM 200 MG/2ML IV SOLN
INTRAVENOUS | Status: DC | PRN
  Administered 2024-01-04: 300 mg via INTRAVENOUS

## 2024-01-04 MED ORDER — DEXAMETHASONE SODIUM PHOSPHATE 10 MG/ML IJ SOLN
INTRAMUSCULAR | Status: AC
  Filled 2024-01-04: qty 1

## 2024-01-04 MED ORDER — ORAL CARE MOUTH RINSE: 15.0000 mL | Freq: Once | OROMUCOSAL | Status: AC

## 2024-01-04 MED ORDER — SODIUM CHLORIDE 0.9 % IR SOLN
Status: DC | PRN
  Administered 2024-01-04: 6000 mL

## 2024-01-04 MED ORDER — DEXAMETHASONE SODIUM PHOSPHATE 10 MG/ML IJ SOLN
INTRAMUSCULAR | Status: DC | PRN
  Administered 2024-01-04: 5 mg via INTRAVENOUS

## 2024-01-04 MED ORDER — FENTANYL CITRATE PF 50 MCG/ML IJ SOSY
25.0000 ug | PREFILLED_SYRINGE | INTRAMUSCULAR | Status: DC | PRN
  Administered 2024-01-04 (×2): 25 ug via INTRAVENOUS

## 2024-01-04 MED ORDER — SODIUM CHLORIDE 0.9 % IV SOLN
2.0000 g | INTRAVENOUS | Status: AC
  Administered 2024-01-04: 2 g via INTRAVENOUS
  Filled 2024-01-04: qty 20

## 2024-01-04 MED ORDER — MIDAZOLAM HCL 2 MG/2ML IJ SOLN
INTRAMUSCULAR | Status: AC
  Filled 2024-01-04: qty 2

## 2024-01-04 MED ORDER — INSULIN ASPART 100 UNIT/ML IJ SOLN
0.0000 [IU] | INTRAMUSCULAR | Status: AC | PRN
  Administered 2024-01-04: 2 [IU] via SUBCUTANEOUS

## 2024-01-04 MED ORDER — FENTANYL CITRATE (PF) 100 MCG/2ML IJ SOLN
INTRAMUSCULAR | Status: DC | PRN
  Administered 2024-01-04 (×3): 50 ug via INTRAVENOUS
  Administered 2024-01-04 (×2): 25 ug via INTRAVENOUS

## 2024-01-04 MED ORDER — FENTANYL CITRATE PF 50 MCG/ML IJ SOSY
PREFILLED_SYRINGE | INTRAMUSCULAR | Status: AC
  Filled 2024-01-04: qty 1

## 2024-01-04 MED ORDER — ACETAMINOPHEN 500 MG PO TABS
1000.0000 mg | ORAL_TABLET | Freq: Once | ORAL | Status: AC
  Administered 2024-01-04: 1000 mg via ORAL
  Filled 2024-01-04: qty 2

## 2024-01-04 MED ORDER — DROPERIDOL 2.5 MG/ML IJ SOLN: 0.6250 mg | Freq: Once | INTRAMUSCULAR | Status: DC | PRN

## 2024-01-04 MED ORDER — LIDOCAINE HCL (PF) 2 % IJ SOLN
INTRAMUSCULAR | Status: AC
  Filled 2024-01-04: qty 5

## 2024-01-04 MED ORDER — LACTATED RINGERS IV SOLN: INTRAVENOUS | Status: DC

## 2024-01-04 MED ORDER — PROPOFOL 10 MG/ML IV BOLUS
INTRAVENOUS | Status: DC | PRN
  Administered 2024-01-04: 200 mg via INTRAVENOUS

## 2024-01-04 MED ORDER — OXYCODONE HCL 5 MG/5ML PO SOLN: 5.0000 mg | Freq: Once | ORAL | Status: AC | PRN

## 2024-01-04 SURGICAL SUPPLY — 20 items
BAG URO CATCHER STRL LF (MISCELLANEOUS) ×1 IMPLANT
BASKET LASER NITINOL 1.9FR (BASKET) IMPLANT
BASKET ZERO TIP NITINOL 2.4FR (BASKET) IMPLANT
CATH URETL OPEN END 6FR 70 (CATHETERS) ×1 IMPLANT
CLOTH BEACON ORANGE TIMEOUT ST (SAFETY) ×1 IMPLANT
EXTRACTOR STONE 1.7FRX115CM (UROLOGICAL SUPPLIES) IMPLANT
FIBER LASER MOSES 200 DFL (Laser) IMPLANT
FIBER LASER MOSES 365 DFL (Laser) IMPLANT
GLOVE BIO SURGEON STRL SZ7.5 (GLOVE) ×1 IMPLANT
GOWN STRL REUS W/ TWL XL LVL3 (GOWN DISPOSABLE) ×1 IMPLANT
GUIDEWIRE STR DUAL SENSOR (WIRE) ×1 IMPLANT
GUIDEWIRE ZIPWRE .038 STRAIGHT (WIRE) IMPLANT
KIT TURNOVER KIT A (KITS) IMPLANT
MANIFOLD NEPTUNE II (INSTRUMENTS) ×1 IMPLANT
PACK CYSTO (CUSTOM PROCEDURE TRAY) ×1 IMPLANT
SHEATH NAVIGATOR HD 11/13X28 (SHEATH) IMPLANT
SHEATH NAVIGATOR HD 11/13X36 (SHEATH) IMPLANT
STENT URET 6FRX24 CONTOUR (STENTS) IMPLANT
TUBING CONNECTING 10 (TUBING) ×1 IMPLANT
TUBING UROLOGY SET (TUBING) ×1 IMPLANT

## 2024-01-04 NOTE — OR Nursing (Signed)
 Removed 6 x 24 stent left side

## 2024-01-04 NOTE — H&P (Signed)
 H&P  Chief Complaint: Bilateral renal stones  History of Present Illness: Penny Huerta is a 58 year old female with a history of endometrial cancer underwent surveillance CT December 2024 which showed an 18 mm left lower pole stone and a 10 mm right lower pole stone.  She was brought for bilateral ureteroscopy March 2025 for the left lower pole stone was difficult to access and the right collecting system was difficult to access and she underwent bilateral stents.  She has been on nightly antibiotic and done well.  No bladder pain or fever.  No gross hematuria.  She did have vaginal spotting and has a follow-up with Dr. Daisey Dryer.  Otherwise she is well without cough cold or congestion.  Past Medical History:  Diagnosis Date   Allergy    Anxiety    Arthritis    Atrial fibrillation (HCC)    B12 deficiency    Carotid stenosis    Chest pain    Coronary artery disease    Depression    Diabetes mellitus without complication (HCC)    endometrial ca 05/2023   Family history of hyperlipidemia    Heart murmur    ARRHYTHMIA   History of drug use    History of kidney stones    Hx of CABG    Hyperlipidemia    Hypertension    Obesity    Pneumonia    SOB (shortness of breath)    Stroke [facility])    Past Surgical History:  Procedure Laterality Date   CORONARY ARTERY BYPASS GRAFT  01/31/2022   x 4   CYSTOSCOPY/URETEROSCOPY/HOLMIUM LASER/STENT PLACEMENT Bilateral 11/26/2023   Procedure: CYSTOSCOPY/LEFT URETEROSCOPY LEFT HOLMIUM LASER RIGHT RETROGRADE PYELOGRAM BILATERAL URETERAL STENT PLACEMENT;  Surgeon: Christina Coyer, MD;  Location: [facility];  Service: Urology;  Laterality: Bilateral;   DILATION AND CURETTAGE OF UTERUS     HYSTEROSCOPY W/ ENDOMETRIAL ABLATION  09/14/2009   x2   LEFT HEART CATH AND CORONARY ANGIOGRAPHY N/A 12/05/2021   Procedure: LEFT HEART CATH AND CORONARY ANGIOGRAPHY;  Surgeon: Kyra Phy, MD;  Location: [facility];  Service: Cardiovascular;  Laterality: N/A;   ROBOTIC  ASSISTED TOTAL HYSTERECTOMY WITH BILATERAL SALPINGO OOPHERECTOMY N/A 07/06/2023   Procedure: XI ROBOTIC ASSISTED TOTAL HYSTERECTOMY WITH BILATERAL SALPINGO OOPHORECTOMY and CYSTOSCOPY;  Surgeon: Derrel Flies, MD;  Location: [facility];  Service: Gynecology;  Laterality: N/A;    Home Medications:  Medications Prior to Admission  Medication Sig Dispense Refill Last Dose/Taking   aspirin  EC 81 MG tablet Take 81 mg by mouth daily. Swallow whole.   Past Week   Cholecalciferol (VITAMIN D3) 250 MCG (10000 UT) TABS Take 10,000 Units by mouth every Sunday.   Past Week   empagliflozin  (JARDIANCE ) 10 MG TABS tablet Take 1 tablet (10 mg total) by mouth daily. 30 tablet 11 01/01/2024   ezetimibe  (ZETIA ) 10 MG tablet Take 1 tablet (10 mg total) by mouth daily. 90 tablet 3 01/03/2024   hydroxypropyl methylcellulose / hypromellose (ISOPTO TEARS / GONIOVISC) 2.5 % ophthalmic solution Place 1 drop into both eyes as needed for dry eyes.   Taking As Needed   ibuprofen (ADVIL) 200 MG tablet Take 800 mg by mouth 2 (two) times daily.   Past Week   losartan  (COZAAR ) 50 MG tablet Take 1 tablet (50 mg total) by mouth daily. 90 tablet 3 01/03/2024   metFORMIN  (GLUCOPHAGE -XR) 500 MG 24 hr tablet Take 500 mg by mouth 2 (two) times daily.   01/03/2024   Metoprolol  Tartrate 37.5 MG TABS  Take 37.5 mg by mouth 2 (two) times daily.   01/04/2024 at  4:45 AM   OVER THE COUNTER MEDICATION Take 1 capsule by mouth 2 (two) times daily. Doterra Tri-Ease (for allergies)   Past Week   sertraline  (ZOLOFT ) 50 MG tablet Take 1 tablet (50 mg total) by mouth daily. 90 tablet 0 01/03/2024   tamsulosin (FLOMAX) 0.4 MG CAPS capsule Take 0.4 mg by mouth at bedtime.   01/03/2024   trimethoprim (TRIMPEX) 100 MG tablet Take 100 mg by mouth at bedtime.   01/03/2024   Evolocumab  (REPATHA  SURECLICK) 140 MG/ML SOAJ INJECT 1 DOSE INTO THE SKIN EVERY 14 DAYS 6 mL 3    nitrofurantoin  (MACRODANTIN ) 50 MG capsule Take 1 capsule (50 mg total) by mouth at bedtime.  (Patient not taking: Reported on 12/20/2023) 45 capsule 0 Not Taking   ondansetron  (ZOFRAN ) 8 MG tablet Take 1 tablet (8 mg total) by mouth every 8 (eight) hours as needed for nausea or vomiting. (Patient not taking: Reported on 12/20/2023) 20 tablet 0 Not Taking   Allergies:  Allergies  Allergen Reactions   Alprazolam Other (See Comments)    Caused mood changes   Citalopram Hydrobromide Other (See Comments)    Mood changes   Lisinopril  Cough   Nitrofurantoin  Monohyd Macro Nausea And Vomiting   Statins Other (See Comments)    MUSCLE LOSS - lipitor, zocor   Sulfa Drugs Cross Reactors Nausea And Vomiting    Family History  Problem Relation Age of Onset   Heart disease Mother    Stroke Mother    Hyperlipidemia Mother    Transient ischemic attack Mother    Hypertension Mother    Diabetes Mother    Depression Mother    Anxiety disorder Mother    Obesity Mother    Stroke Father    Transient ischemic attack Father    Sudden death Maternal Uncle    Heart disease Maternal Grandmother 26   Hyperlipidemia Maternal Grandmother    Hyperlipidemia Maternal Grandfather    Heart disease Maternal Grandfather    Ovarian cancer Paternal Grandmother    Stroke Paternal Grandfather    Hypertension Paternal Grandfather    Diabetes Paternal Grandfather    Colon cancer Neg Hx    Breast cancer Neg Hx    Endometrial cancer Neg Hx    Pancreatic cancer Neg Hx    Prostate cancer Neg Hx    Social History:  reports that she quit smoking about 26 years ago. Her smoking use included cigarettes. She started smoking about 46 years ago. She has never used smokeless tobacco. She reports that she does not currently use drugs. She reports that she does not drink alcohol.  ROS: A complete review of systems was performed.  All systems are negative except for pertinent findings as noted. Review of Systems  All other systems reviewed and are negative.    Physical Exam:  Vital signs in last 24 hours: Temp:   [98 F (36.7 C)] 98 F (36.7 C) (04/22 0545) Pulse Rate:  [74] 74 (04/22 0545) Resp:  [16] 16 (04/22 0545) BP: (150)/(70) 150/70 (04/22 0545) SpO2:  [93 %] 93 % (04/22 0545) Weight:  [158.8 kg] 158.8 kg (04/22 0545) General:  Alert and oriented, No acute distress HEENT: Normocephalic, atraumatic Cardiovascular: Regular rate and rhythm Lungs: Regular rate and effort Abdomen: Soft, nontender, nondistended, no abdominal masses Back: No CVA tenderness Extremities: No edema Neurologic: Grossly intact  Laboratory Data:  Results for orders placed or performed during  the hospital encounter of 01/04/24 (from the past 24 hours)  Glucose, capillary     Status: Abnormal   Collection Time: 01/04/24  5:50 AM  Result Value Ref Range   Glucose-Capillary 177 (H) 70 - 99 mg/dL   Comment 1 Notify RN    Comment 2 Document in Chart    No results found for this or any previous visit (from the past 240 hours). Creatinine: No results for input(s): "CREATININE" in the last 168 hours.  Impression/Assessment:  Bilateral renal stones-  Plan:  I discussed with the patient and her husband the nature, potential benefits, risks and alternatives to cystoscopy with bilateral ureteroscopy laser lithotripsy and stent exchange, including side effects of the proposed treatment, the likelihood of the patient achieving the goals of the procedure, and any potential problems that might occur during the procedure or recuperation.  I drew them a picture of the anatomy and discussed the difficulty in accessing lower pole stones.  She may need a staged procedure.  All questions answered. Patient elects to proceed.   Christina Coyer 01/04/2024, 7:16 AM

## 2024-01-04 NOTE — Discharge Instructions (Signed)
 Removal of the stents: Remove the stents on Friday morning, January 07, 2024 by pulling the strings as instructed

## 2024-01-04 NOTE — Transfer of Care (Signed)
 Immediate Anesthesia Transfer of Care Note  Patient: Penny Huerta  Procedure(s) Performed: CYSTOSCOPY/URETEROSCOPY/HOLMIUM LASER/STENT PLACEMENT (Bilateral)  Patient Location: PACU  Anesthesia Type:General  Level of Consciousness: awake, alert , and oriented  Airway & Oxygen Therapy: Patient Spontanous Breathing and Patient connected to face mask oxygen  Post-op Assessment: Report given to RN and Post -op Vital signs reviewed and stable  Post vital signs: Reviewed and stable  Last Vitals:  Vitals Value Taken Time  BP 144/74 01/04/24 0911  Temp    Pulse 80 01/04/24 0912  Resp 18 01/04/24 0912  SpO2 94 % 01/04/24 0912  Vitals shown include unfiled device data.  Last Pain:  Vitals:   01/04/24 0545  TempSrc: Oral  PainSc: 0-No pain         Complications: No notable events documented.

## 2024-01-04 NOTE — Anesthesia Postprocedure Evaluation (Signed)
 Anesthesia Post Note  Patient: Penny Huerta  Procedure(s) Performed: CYSTOSCOPY/URETEROSCOPY/HOLMIUM LASER/STENT PLACEMENT (Bilateral)     Patient location during evaluation: PACU Anesthesia Type: General Level of consciousness: awake and alert Pain management: pain level controlled Vital Signs Assessment: post-procedure vital signs reviewed and stable Respiratory status: spontaneous breathing, nonlabored ventilation and respiratory function stable Cardiovascular status: blood pressure returned to baseline Postop Assessment: no apparent nausea or vomiting Anesthetic complications: no   No notable events documented.  Last Vitals:  Vitals:   01/04/24 1030 01/04/24 1054  BP: 137/66 (!) 148/76  Pulse: 79 82  Resp: 14 16  Temp:  36.7 C  SpO2: 94% 92%    Last Pain:  Vitals:   01/04/24 1054  TempSrc: Oral  PainSc: 2                  Rayfield Cairo

## 2024-01-04 NOTE — Anesthesia Procedure Notes (Signed)
 Procedure Name: Intubation Date/Time: 01/04/2024 7:32 AM  Performed by: Bing Buff, RNPre-anesthesia Checklist: Patient identified, Emergency Drugs available, Suction available and Patient being monitored Patient Re-evaluated:Patient Re-evaluated prior to induction Oxygen Delivery Method: Circle system utilized Preoxygenation: Pre-oxygenation with 100% oxygen Induction Type: IV induction Ventilation: Mask ventilation without difficulty Laryngoscope Size: Mac and 4 Grade View: Grade I Tube type: Oral Tube size: 7.0 mm Number of attempts: 1 Airway Equipment and Method: Stylet and Oral airway Placement Confirmation: ETT inserted through vocal cords under direct vision, positive ETCO2 and breath sounds checked- equal and bilateral Secured at: 23 cm Tube secured with: Tape Dental Injury: Teeth and Oropharynx as per pre-operative assessment

## 2024-01-04 NOTE — OR Nursing (Signed)
 Removed right side stent 6 x24

## 2024-01-04 NOTE — Op Note (Signed)
 Preoperative diagnosis: Bilateral renal stones Postoperative diagnosis: Same  Procedure: Cystoscopy with bilateral ureteroscopy laser lithotripsy and bilateral ureteral stent exchange  Surgeon: Derrick Fling  Anesthesia: General  Indication for procedure: Penny Huerta is a 58 year old female with bilateral lower pole stones and was brought for bilateral ureteroscopy after prestenting.  Findings: On exam the vulva appeared normal without lesion.  There was no vaginal bleeding noted.  On cystoscopy the bladder was unremarkable.  Both stents were in place but there were no urothelial lesions or stones in the bladder.  No erythema or sign of fistula in the bladder.  Left ureteroscopy revealed the stone in the lower pole.  We are able to get about 90% of that stone there was 1 piece that was lateral and inferior that we could not reach with the laser, or to basket.  Right ureteroscopy revealed the stone in the right lower pole which was completely dusted.  Both stones were visible on fluoroscopy so we also used fluoroscopy as a guide for fragmentation.  No significant stone remained on the right on fluoroscopy and just a small fragment possibly on the left.    Description of procedure: After consent was obtained patient brought to the operating room.  After adequate anesthesia she is placed lithotomy position and prepped and draped in the usual sterile fashion.  Timeout was performed to confirm the patient and procedure.  The cystoscope was passed per urethra and the left ureteral stent grasped and removed through the urethral meatus.  A sensor wire is advanced and coiled in the upper calyx.  We used an access sheath to get 2 wires in place and went adjacent to the sensor as the safety.  A dual channel digital ureteroscope was advanced up into the kidney where the stone was located.  A 242 m laser fiber was advanced and the stone was dusted.  Once we worked as much as we could we passed a engage ZeroTip basket  into the lower pole and were able to sweep out a large fragment of the stone and pushed into a midpole calyx where it was dusted.  There was 1 fragment that was just outside of the ureteroscope I could not quite angle 2.  We made multiple attempts with the ZeroTip basket and then switched to an engage tried to pass that and grasp the top of the stone but could not.  I passed the laser again and at slow and fast frequency tried to jostled the stone around as well as irrigate the stone out of its position but could not.  Therefore that fragment remains.  We then inspected the collecting system and renal pelvis noted that to be free of any other stone fragment.  We backed the access sheath out over the ureteroscope and the renal pelvis and ureter carefully inspected on the way out noted there to be no injury or stone.  The cystoscope was then passed per urethra and the right ureteral stent grasped and removed through the urethral meatus.  The sensor wire would not advance through the stent therefore I passed the scope back in the bladder and passed a sensor wire adjacent to the stent and coiled that in the midpole calyx.  The stent was then removed.  I used a short access sheath to get 2 wires in place on the right and went back in with the access sheath adjacent to the sensor wire leaving it as the safety.  The ureteroscope was passed up into the right kidney and the  calyces inspected.  The stone was located in the right lower pole and the 242 m laser fiber advanced and the stone was dusted.  No significant stone fragment remained. The access sheath was then backed out on the ureteroscope and the collecting system was inspected as well as the renal pelvis and ureter on the way out noted to have no stone fragment or injury.  The wire was backloaded on the cystoscope and a 6 x 24 cm stent was advanced up the right.  The wire was removed with a good coil seen in the kidney and a good coil in the bladder.  The left  wire was then backloaded on the ureteroscope and a 6 x 24 cm stent advanced.  The wire was removed with a good coil seen in the kidney and a good coil in the bladder.  Bladder was inspected 1 more time and the scope backed out.  She was awakened and taken to the recovery room in stable condition.  Complications: None  Blood loss: Minimal  Specimens: None  Drains: Bilateral 6 x 24 cm ureteral stents  Disposition: Patient stable to PACU

## 2024-01-05 ENCOUNTER — Inpatient Hospital Stay: Attending: Obstetrics & Gynecology | Admitting: Obstetrics & Gynecology

## 2024-01-05 ENCOUNTER — Encounter (HOSPITAL_COMMUNITY): Payer: Self-pay | Admitting: Urology

## 2024-01-05 VITALS — BP 123/68 | HR 91 | Temp 98.2°F | Resp 19 | Ht 65.0 in | Wt 350.4 lb

## 2024-01-05 DIAGNOSIS — Z90710 Acquired absence of both cervix and uterus: Secondary | ICD-10-CM | POA: Diagnosis not present

## 2024-01-05 DIAGNOSIS — Z9079 Acquired absence of other genital organ(s): Secondary | ICD-10-CM | POA: Insufficient documentation

## 2024-01-05 DIAGNOSIS — Z90722 Acquired absence of ovaries, bilateral: Secondary | ICD-10-CM | POA: Diagnosis not present

## 2024-01-05 DIAGNOSIS — N950 Postmenopausal bleeding: Secondary | ICD-10-CM | POA: Diagnosis not present

## 2024-01-05 DIAGNOSIS — Z8542 Personal history of malignant neoplasm of other parts of uterus: Secondary | ICD-10-CM | POA: Diagnosis not present

## 2024-01-05 DIAGNOSIS — C541 Malignant neoplasm of endometrium: Secondary | ICD-10-CM

## 2024-01-05 NOTE — Patient Instructions (Signed)
 Keep regularly scheduled appointment with Dr. Daisey Dryer.

## 2024-01-05 NOTE — Progress Notes (Signed)
 Follow Up Note: Gyn-Onc  Penny Huerta 58 y.o. female  CC: Vaginal bleeding   HPI: The oncology history was reviewed.  Interval History: Discussed the use of AI scribe software for clinical note transcription with the patient, who gave verbal consent to proceed.  History of Present Illness   She experienced an episode of vaginal bleeding, which she is unsure about. This occurred several days ago.  No concomitant sxs  I.e., cough, lethargy or abdominal distention.       Review of Systems  Review of Systems  Constitutional:  Negative for malaise/fatigue and weight loss.  Respiratory:  Negative for shortness of breath and wheezing.   Cardiovascular:  Negative for chest pain and leg swelling.  Gastrointestinal:  Negative for abdominal pain, blood in stool, constipation, nausea and vomiting.  Genitourinary: See above  Musculoskeletal:  Negative for joint pain and myalgias.  Neurological:  Negative for weakness.  Psychiatric/Behavioral:  Negative for depression. The patient does not have insomnia.    Current medications, allergy, social history, past surgical history, past medical history, family history were all reviewed.    Vitals:  Blood pressure 123/68, pulse 91, temperature 98.2 F (36.8 C), temperature source Oral, resp. rate 19, height 5\' 5"  (1.651 m), weight (!) 350 lb 6.4 oz (158.9 kg), last menstrual period 01/06/2012, SpO2 95%.  Physical Exam:  Physical Exam Exam conducted with a chaperone present.  Constitutional:      General: She is not in acute distress. Cardiovascular:     Rate and Rhythm: Normal rate and regular rhythm.  Pulmonary:     Effort: Pulmonary effort is normal.     Breath sounds: Normal breath sounds. No wheezing or rhonchi.  Abdominal:     Palpations: Abdomen is soft.     Tenderness: There is no abdominal tenderness. There is no right CVA tenderness or left CVA tenderness.     Hernia: No hernia is present.  Genitourinary:    General: Normal  vulva. Right-sided perinea hyperpigmented plaque--3 - 5 mm    Urethra: No urethral lesion.     Vagina: No lesions. No bleeding Musculoskeletal:     Cervical back: Neck supple.     Right lower leg: No edema.     Left lower leg: No edema.  Lymphadenopathy:     Upper Body:     Right upper body: No supraclavicular adenopathy.     Left upper body: No supraclavicular adenopathy.     Lower Body: No right inguinal adenopathy. No left inguinal adenopathy.  Skin:    Findings: No rash.  Neurological:     Mental Status: She is oriented to person, place, and time.   Assessment/Plan:  Endometrial cancer, grade I (HCC) Penny Huerta is a 58 y.o. woman with  Stage IA2 FIGO grade 1 endometrial cancer s/p RA-TLH, BSO, cystoscopy (SLNBx not performed due to increased retroperitoneal and visceral adiposity significantly limiting visualization) on 07/06/23.  Recent episode of vaginal bleeding.  No associated sxs.  Normal vaginal exam.  Consider possible GSM.   .-Continue to monitor sxs - Keep next scheduled visit w/Dr. Daisey Dryer   I personally spent 25 minutes face-to-face and non-face-to-face in the care of this patient, which includes all pre, intra, and post visit time on the date of service.     Abdul Hodgkin, MD

## 2024-01-05 NOTE — Assessment & Plan Note (Signed)
 Penny Huerta is a 58 y.o. woman with  Stage IA2 FIGO grade 1 endometrial cancer s/p RA-TLH, BSO, cystoscopy (SLNBx not performed due to increased retroperitoneal and visceral adiposity significantly limiting visualization) on 07/06/23.  Recent episode of vaginal bleeding.  No associated sxs.  Normal vaginal exam.  Consider possible GSM.   .-Continue to monitor sxs - Keep next scheduled visit w/Dr. Daisey Dryer

## 2024-01-17 ENCOUNTER — Inpatient Hospital Stay: Payer: BC Managed Care – PPO | Attending: Obstetrics & Gynecology | Admitting: Psychiatry

## 2024-01-17 ENCOUNTER — Encounter: Payer: Self-pay | Admitting: Psychiatry

## 2024-01-17 VITALS — BP 122/82 | HR 69 | Temp 98.5°F | Resp 18 | Ht 65.0 in | Wt 354.0 lb

## 2024-01-17 DIAGNOSIS — Z9079 Acquired absence of other genital organ(s): Secondary | ICD-10-CM | POA: Diagnosis not present

## 2024-01-17 DIAGNOSIS — Z90710 Acquired absence of both cervix and uterus: Secondary | ICD-10-CM | POA: Insufficient documentation

## 2024-01-17 DIAGNOSIS — Z8542 Personal history of malignant neoplasm of other parts of uterus: Secondary | ICD-10-CM | POA: Diagnosis not present

## 2024-01-17 DIAGNOSIS — N950 Postmenopausal bleeding: Secondary | ICD-10-CM

## 2024-01-17 DIAGNOSIS — C541 Malignant neoplasm of endometrium: Secondary | ICD-10-CM

## 2024-01-17 DIAGNOSIS — Z90722 Acquired absence of ovaries, bilateral: Secondary | ICD-10-CM | POA: Insufficient documentation

## 2024-01-17 NOTE — Patient Instructions (Signed)
 It was a pleasure to see you in clinic today. - Normal exam today - Will order a ct scan - Return visit planned for 6 months unless indicated sooner by ct scan.  Thank you very much for allowing me to provide care for you today.  I appreciate your confidence in choosing our Gynecologic Oncology team at [facility].  If you have any questions about your visit today please call our office or send us  a MyChart message and we will get back to you as soon as possible.

## 2024-01-17 NOTE — Progress Notes (Signed)
 Gynecologic Oncology Return Clinic Visit  Date of Service: 01/17/2024 Referring Provider: Terri Fester, MD   Assessment & Plan: Penny Huerta is a 58 y.o. 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  Endometrial cancer: - No high-intermediate risk factors. SLNBx not performed. Pathology would not have met Mayo criteria. - Given no lymph node evaluation, postop CT performed and negative (08/30/23). - Given some limitations of exam due to body habitus, no lymph node evaluation and recents episodes of vaginal bleeding, will get CT scan. Ordered. - Otherwise NED on exam today. - Signs/symptoms of recurrence reviewed. - Continue surveillance with follow-up q6 months x2 years then yearly.  - Reviewed that after 5 years NED, will be safe to return to Ob/Gyn.   RTC 45mo unless indicated sooner.  Derrel Flies, MD Gynecologic Oncology   Medical Decision Making I personally spent  TOTAL 25 minutes face-to-face and non-face-to-face in the care of this patient, which includes all pre, intra, and post visit time on the date of service.  ----------------------- Reason for Visit: Surveillance  Treatment History: Oncology History  Endometrial cancer, grade I (HCC)  05/28/2023 Initial Biopsy   Endometrial biopsy: Well-differentiated endometrial adenocarcinoma (FIGO I)   05/28/2023 Initial Diagnosis   Endometrial cancer, grade I (HCC)   06/07/2023 Imaging   Pelvic ultrasound: Impression: Technically difficult study due to patient BMI There appears to be a post left intramural fibroid 2.1 x 2.1 x 1.8 cm. The endometrium appears focally thickened towards the fundus measuring 16.4 mm.  Cannot rule out polyp versus other. Limited TAS was added to  evaluate ovaries. The right ovary was not visualized. No free fluid.   07/06/2023 Cancer Staging   Staging form: Corpus Uteri - Carcinoma and Carcinosarcoma, AJCC 8th Edition - Clinical stage from 07/06/2023: FIGO Stage IA, calculated as Stage Unknown (cT1a, cNX, cM0) - Signed by Derrel Flies, MD on 07/19/2023 Histopathologic type: Endometrioid adenocarcinoma, NOS Stage prefix: Initial diagnosis Histologic grade (G): G1 Histologic grading system: 3 grade system   07/06/2023 Surgery   Robotic-assisted total laparoscopic hysterectomy, bilateral salpingo-oophorectomy, cystoscopy   Findings: Upper abdominal survey with nodular appearance of liver, possible fatty liver. Normal diaphragm. Normal appearing small and large bowel. Increased visceral adiposity limiting visualization in the pelvis. Normal uterus, tubes, and left ovary.  Approximate 3cm adnexal mass on the right ovary. No evidence of peritoneal disease, ascites, or carcinomatosis.  Evaluation for sentinel lymph nodes deferred due to increased retroperitoneal and visceral adiposity significantly limiting visualization in the pelvis and limiting safety of retroperitoneal dissection.  Cystoscopy with normal efflux from bilateral ureteral orifices.      07/06/2023 Pathology Results   A. UTERUS, CERVIX, BILATERAL FALLOPIAN TUBES, AND OVARIES, RESECTION: - Endometrium: Focal endometrioid carcinoma, FIGO grade 1, with background extensive endometrial intraepithelial neoplasia - Myometrial invasion: Less than 50% - Cervix: Benign, nabothian cysts - Leiomyomata - Bilateral fallopian tubes: Benign, no significant pathologic changes - Right ovary: Benign monodermal teratoma - Left ovary: Benign, follicular cyst - see oncology table  ONCOLOGY TABLE:   UTERUS, CARCINOMA OR CARCINOSARCOMA: Resection  Procedure: Total hysterectomy and bilateral salpingo-oophorectomy Histologic Type: Endometrioid carcinoma, FIGO grade 1 Histologic  Grade: Grade 1 Myometrial Invasion:      Depth of Myometrial Invasion (mm): 5 mm      Myometrial Thickness (mm): 15 mm  Percentage of Myometrial Invasion: 30% Uterine Serosa Involvement: Not identified Cervical stromal Involvement: Not identified Extent of involvement of other tissue/organs: Not identified Peritoneal/Ascitic Fluid: Not applicable Lymphovascular Invasion: Not identified Regional Lymph Nodes: Not applicable Distant Metastasis:      Distant Site(s) Involved: Not applicable Pathologic Stage Classification (pTNM, AJCC 8th Edition): pT 1a, pN not assigned Ancillary Studies: MMR / MSI testing will be ordered Representative Tumor Block: A3 Comment(s): P53 immunohistochemical stain is pending and will be reported in addendum   IHC EXPRESSION RESULTS  TEST           RESULT MLH1:          Preserved nuclear expression MSH2:          Preserved nuclear expression MSH6:          Preserved nuclear expression PMS2:          Preserved nuclear expression      Interval History: Presents today with her husband.  Recently underwent cystoscopy, bilateral ureteral stent placement, laser lithotripsy for bilateral renal stones.  She then was seen by Dr. Gaylin Ke on 01/05/2024 for concern of an episode of vaginal bleeding.  Exam overall was not concerning for recurrence.  Patient reports 1 additional spots since that visit that was smaller than the size of the eraser of pencil.  She also notes a couple days of nausea this past week but nothing persistent beyond that and no vomiting.  Otherwise denies persistent abdominal or pelvic pains, unintentional weight loss, change in bowel or bladder habits, early satiety, bloating, vomiting.   Past Medical/Surgical History: Past Medical History:  Diagnosis Date   Allergy    Anxiety    Arthritis    Atrial fibrillation (HCC)    B12 deficiency    Carotid stenosis    Chest pain    Coronary artery disease    Depression    Diabetes  mellitus without complication (HCC)    endometrial ca 05/2023   Family history of hyperlipidemia    Heart murmur    ARRHYTHMIA   History of drug use    History of kidney stones    Hx of CABG    Hyperlipidemia    Hypertension    Obesity    Pneumonia    SOB (shortness of breath)    Stroke [facility])     Past Surgical History:  Procedure Laterality Date   CORONARY ARTERY BYPASS GRAFT  01/31/2022   x 4   CYSTOSCOPY/URETEROSCOPY/HOLMIUM LASER/STENT PLACEMENT Bilateral 11/26/2023   Procedure: CYSTOSCOPY/LEFT URETEROSCOPY LEFT HOLMIUM LASER RIGHT RETROGRADE PYELOGRAM BILATERAL URETERAL STENT PLACEMENT;  Surgeon: Christina Coyer, MD;  Location: [facility];  Service: Urology;  Laterality: Bilateral;   CYSTOSCOPY/URETEROSCOPY/HOLMIUM LASER/STENT PLACEMENT Bilateral 01/04/2024   Procedure: CYSTOSCOPY/URETEROSCOPY/HOLMIUM LASER/STENT PLACEMENT;  Surgeon: Christina Coyer, MD;  Location: [facility];  Service: Urology;  Laterality: Bilateral;   DILATION AND CURETTAGE OF UTERUS     HYSTEROSCOPY W/ ENDOMETRIAL ABLATION  09/14/2009   x2   LEFT HEART CATH AND CORONARY ANGIOGRAPHY N/A 12/05/2021   Procedure: LEFT HEART CATH AND CORONARY ANGIOGRAPHY;  Surgeon: Kyra Phy, MD;  Location: [facility];  Service: Cardiovascular;  Laterality: N/A;   ROBOTIC ASSISTED TOTAL HYSTERECTOMY WITH BILATERAL SALPINGO OOPHERECTOMY N/A 07/06/2023   Procedure: XI ROBOTIC ASSISTED TOTAL HYSTERECTOMY WITH BILATERAL SALPINGO OOPHORECTOMY and CYSTOSCOPY;  Surgeon: Derrel Flies, MD;  Location: [facility];  Service: Gynecology;  Laterality: N/A;    Family History  Problem Relation Age of Onset   Heart disease  Mother    Stroke Mother    Hyperlipidemia Mother    Transient ischemic attack Mother    Hypertension Mother    Diabetes Mother    Depression Mother    Anxiety disorder Mother    Obesity Mother    Stroke Father    Transient ischemic attack Father    Sudden death Maternal Uncle    Heart disease Maternal  Grandmother 26   Hyperlipidemia Maternal Grandmother    Hyperlipidemia Maternal Grandfather    Heart disease Maternal Grandfather    Ovarian cancer Paternal Grandmother    Stroke Paternal Grandfather    Hypertension Paternal Grandfather    Diabetes Paternal Grandfather    Colon cancer Neg Hx    Breast cancer Neg Hx    Endometrial cancer Neg Hx    Pancreatic cancer Neg Hx    Prostate cancer Neg Hx     Social History   Socioeconomic History   Marital status: Married    Spouse name: Not on file   Number of children: Not on file   Years of education: Not on file   Highest education level: Not on file  Occupational History   Occupation: Print production planner  Tobacco Use   Smoking status: Former    Current packs/day: 0.00    Types: Cigarettes    Start date: 09/14/1977    Quit date: 09/14/1997    Years since quitting: 26.3   Smokeless tobacco: Never  Vaping Use   Vaping status: Never Used  Substance and Sexual Activity   Alcohol use: Never   Drug use: Not Currently    Comment: "most of them" - quit in 1994   Sexual activity: Not Currently    Partners: Male  Other Topics Concern   Not on file  Social History Narrative   Exercise-- none   Right handed   Social Drivers of Health   Financial Resource Strain: Medium Risk (10/03/2023)   Received from [facility]   Overall Financial Resource Strain (CARDIA)    Difficulty of Paying Living Expenses: Somewhat hard  Food Insecurity: Food Insecurity Present (10/03/2023)   Received from [facility]   Hunger Vital Sign    Worried About Running Out of Food in the Last Year: Sometimes true    Ran Out of Food in the Last Year: Never true  Transportation Needs: No Transportation Needs (10/03/2023)   Received from [facility]   PRAPARE - Transportation    Lack of Transportation (Medical): No    Lack of Transportation (Non-Medical): No  Physical Activity: Insufficiently Active (10/03/2023)   Received from [facility]   Exercise Vital  Sign    Days of Exercise per Week: 3 days    Minutes of Exercise per Session: 10 min  Stress: Stress Concern Present (10/03/2023)   Received from [facility] - Occupational Stress Questionnaire    Feeling of Stress : To some extent  Social Connections: Moderately Integrated (10/03/2023)   Received from [facility]   Social Network    How would you rate your social network (family, work, friends)?: Adequate participation with social networks    Current Medications:  Current Outpatient Medications:    aspirin  EC 81 MG tablet, Take 81 mg by mouth daily. Swallow whole., Disp: , Rfl:    Cholecalciferol (VITAMIN D3) 250 MCG (10000 UT) TABS, Take 10,000 Units by mouth every Sunday., Disp: , Rfl:    empagliflozin  (JARDIANCE ) 10 MG TABS tablet, Take 1 tablet (10  mg total) by mouth daily., Disp: 30 tablet, Rfl: 11   Evolocumab  (REPATHA  SURECLICK) 140 MG/ML SOAJ, INJECT 1 DOSE INTO THE SKIN EVERY 14 DAYS, Disp: 6 mL, Rfl: 3   ezetimibe  (ZETIA ) 10 MG tablet, Take 1 tablet (10 mg total) by mouth daily., Disp: 90 tablet, Rfl: 3   hydroxypropyl methylcellulose / hypromellose (ISOPTO TEARS / GONIOVISC) 2.5 % ophthalmic solution, Place 1 drop into both eyes as needed for dry eyes., Disp: , Rfl:    ibuprofen (ADVIL) 200 MG tablet, Take 800 mg by mouth 2 (two) times daily., Disp: , Rfl:    losartan  (COZAAR ) 50 MG tablet, Take 1 tablet (50 mg total) by mouth daily., Disp: 90 tablet, Rfl: 3   metFORMIN  (GLUCOPHAGE -XR) 500 MG 24 hr tablet, Take 500 mg by mouth 2 (two) times daily., Disp: , Rfl:    Metoprolol  Tartrate 37.5 MG TABS, Take 37.5 mg by mouth 2 (two) times daily., Disp: , Rfl:    OVER THE COUNTER MEDICATION, Take 1 capsule by mouth 2 (two) times daily. Doterra Tri-Ease (for allergies), Disp: , Rfl:    sertraline  (ZOLOFT ) 50 MG tablet, Take 1 tablet (50 mg total) by mouth daily., Disp: 90 tablet, Rfl: 0   tamsulosin (FLOMAX) 0.4 MG CAPS capsule, Take 0.4 mg  by mouth at bedtime., Disp: , Rfl:   Review of Symptoms: Complete 10-system review is negative except as above in Interval History.  Physical Exam: BP 122/82 (BP Location: Left Arm, Patient Position: Sitting)   Pulse 69   Temp 98.5 F (36.9 C) (Oral)   Resp 18   Ht 5\' 5"  (1.651 m)   Wt (!) 354 lb (160.6 kg)   LMP 01/06/2012   SpO2 95%   BMI 58.91 kg/m  General: Alert, oriented, no acute distress. HEENT: Normocephalic, atraumatic. Neck symmetric without masses. Sclera anicteric.  Chest: Normal work of breathing. Clear to auscultation bilaterally.   Cardiovascular: Regular rate and rhythm, no murmurs. Abdomen: Soft, nontender.  Normoactive bowel sounds.  No masses appreciated.  Well-healed incisions. Extremities: Grossly normal range of motion.  Warm, well perfused.  No edema bilaterally. Skin: No rashes or lesions noted. Lymphatics: No cervical, supraclavicular, or inguinal adenopathy. GU: Normal appearing external genitalia without erythema, excoriation, or lesions.  Speculum exam reveals normal vaginal mucosa and cuff.  Bimanual exam reveals smooth vaginal mucosa, no nodularity or pelvic mass, limited by body habitus.  Exam chaperoned by Dorice Gardner, NT     Laboratory & Radiologic Studies: CT ABDOMEN PELVIS W CONTRAST 08/30/2023  Narrative CLINICAL DATA:  History of genital cancer, staging. Endometrial cancer. * Tracking Code: BO *.  EXAM: CT ABDOMEN AND PELVIS WITH CONTRAST  TECHNIQUE: Multidetector CT imaging of the abdomen and pelvis was performed using the standard protocol following bolus administration of intravenous contrast.  RADIATION DOSE REDUCTION: This exam was performed according to the departmental dose-optimization program which includes automated exposure control, adjustment of the mA and/or kV according to patient size and/or use of iterative reconstruction technique.  CONTRAST:  OMNIPAQUE  IOHEXOL  300 MG/ML  SOLN  COMPARISON:  MRI abdomen  August 06, 2020  FINDINGS: Lower chest: No acute abnormality.  Hepatobiliary: Hepatomegaly with diffuse hepatic steatosis. Cholelithiasis without findings of acute cholecystitis. No biliary ductal dilation.  Pancreas: No pancreatic ductal dilation or evidence of acute inflammation.  Spleen: No splenomegaly.  Adrenals/Urinary Tract: Bilateral adrenal glands appear normal. No hydronephrosis. Bilateral nonobstructive renal stones including a staghorn type calculus in the left lower pole measuring 18 mm and  a 10 mm stone in the right lower pole. Urinary bladder is unremarkable for degree of distension.  Stomach/Bowel: Radiopaque enteric contrast material traverses the splenic flexure. Stomach is minimally distended limiting evaluation. No pathologic dilation of small or large bowel. Normal appendix. Colonic diverticulosis without findings of acute diverticulitis. No evidence of acute bowel inflammation  Vascular/Lymphatic: Aortic atherosclerosis. Normal caliber abdominal aorta. Smooth IVC contours. The portal, splenic and superior mesenteric veins are patent. No pathologically enlarged abdominal or pelvic lymph nodes.  Reproductive: Status post hysterectomy without enhancing nodularity along the vaginal cuff. No suspicious adnexal masses.  Other: No significant abdominopelvic free fluid.  Musculoskeletal: No aggressive lytic or blastic lesion of bone. Multilevel degenerative changes spine.  IMPRESSION: 1. Status post hysterectomy without evidence of local recurrence. 2. No evidence of metastatic disease in the abdomen or pelvis. 3. Hepatomegaly with diffuse hepatic steatosis. 4. Cholelithiasis without findings of acute cholecystitis. 5. Bilateral nonobstructive renal stones including a staghorn type calculus in the left lower pole measuring 18 mm and a 10 mm stone in the right lower pole. 6. Colonic diverticulosis without findings of acute diverticulitis. 7.  Aortic  Atherosclerosis (ICD10-I70.0).   Electronically Signed By: Tama Fails M.D. On: 09/04/2023 09:35

## 2024-01-18 ENCOUNTER — Encounter: Payer: Self-pay | Admitting: Psychiatry

## 2024-01-18 ENCOUNTER — Telehealth: Payer: Self-pay

## 2024-01-18 NOTE — Telephone Encounter (Signed)
 Per Vira Grieves NP, I reached out to Mrs.Goedecke regarding her Mychart message with concern of rectal bleeding. She states she is not having it any longer, no pain. It happened just once. She will monitor and call back if it returns. She states she also has a CT scan on Friday. (See message from [facility] attached to [employer])

## 2024-01-21 ENCOUNTER — Ambulatory Visit (HOSPITAL_COMMUNITY)
Admission: RE | Admit: 2024-01-21 | Discharge: 2024-01-21 | Disposition: A | Discharge status: Home / Self Care | Source: Ambulatory Visit | Attending: Psychiatry | Admitting: Psychiatry

## 2024-01-21 DIAGNOSIS — C541 Malignant neoplasm of endometrium: Secondary | ICD-10-CM | POA: Insufficient documentation

## 2024-01-21 MED ORDER — IOHEXOL 300 MG/ML  SOLN
100.0000 mL | Freq: Once | INTRAMUSCULAR | Status: AC | PRN
  Administered 2024-01-21: 100 mL via INTRAVENOUS

## 2024-01-24 ENCOUNTER — Encounter: Payer: Self-pay | Admitting: Psychiatry

## 2024-01-30 NOTE — Progress Notes (Signed)
 Cardiology Office Note:   Date:  01/31/2024  ID:  Penny Huerta, DOB 08-Feb-1966, MRN [id number] PCP:  Claudell Cruz, MD  [facility] Providers Cardiologist:  Alyssa Backbone, MD Referring MD: Claudell Cruz, MD  Chief Complaint/Reason for Referral: Cardiology follow-up ASSESSMENT:    1. S/P CABG x 4   2. Type 2 diabetes mellitus with hyperglycemia, without long-term current use of insulin  (HCC)   3. Hypertension associated with diabetes (HCC)   4. Hyperlipidemia associated with type 2 diabetes mellitus (HCC)   5. Aortic atherosclerosis (HCC)   6. BMI 50.0-59.9, adult (HCC)   7. Bilateral carotid artery stenosis      PLAN:   In order of problems listed above:  History of CABG: Continue aspirin  81, Repatha  140 q2weeks, Zetia  10; prescribed as needed nitroglycerin .  . Type 2 diabetes mellitus: Continue losartan  50, aspirin  81,and Jardiance  10 mg daily.  Likely restart Repatha  as below.   Hypertension: Increase losartan  70 mg and check labs next week. Hyperlipidemia: Check lipid panel next week; the patient is currently off Repatha  and will likely restart.  Continue Zetia  10 mg daily. Aortic atherosclerosis:  Continue aspirin  81, Repatha  140 q2weeks, Zetia  10, and strict BP control. Elevated BMI: Patient is not interested in GLP-1 receptor agonist therapy. Bilateral carotid disease: Followed at [facility].  Continue aspirin  81,  Zetia  10, and strict BP control.  Likely restart Repatha  as above.       I spent 35 minutes reviewing all clinical data during and prior to this visit including all relevant imaging studies, laboratories, clinical information from other health systems and prior notes from both Cardiology and other specialties, interviewing the patient, conducting a complete physical examination, and coordinating care in order to formulate a comprehensive and personalized evaluation and treatment plan.      Dispo:  Return in about 6 months (around 08/02/2024).       Medication Adjustments/Labs and Tests Ordered: Current medicines are reviewed at length with the patient today.  Concerns regarding medicines are outlined above.  The following changes have been made:     Labs/tests ordered: Orders Placed This Encounter  Procedures   Comprehensive metabolic panel with GFR   Lipid panel    Medication Changes: Meds ordered this encounter  Medications   losartan  (COZAAR ) 50 MG tablet    Sig: Take 1.5 tablets (75 mg total) by mouth daily.    Dispense:  135 tablet    Refill:  2    Dose change (increased to 75 mg daily on 01/31/24).    Current medicines are reviewed at length with the patient today.  The patient does not have concerns regarding medicines.  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 Maximo Spar Aortic atherosclerosis CT A/P 2024 BMI 51 Patient defers GLP1RA Peripheral vascular disease  intracranial atherosclerosis, 70% stenosis of left ICA and severe stenosis of right ICA Followed at [facility]   March 2023: The patient returns for follow-up.  She was initially seen in January 2023 due to chest pain.  She ultimately underwent cardiac catheterization which demonstrated severe multivessel disease.  Due to her body habitus and carotid disease she was not felt to be a candidate for surgical revascularization at [facility].  She  ultimately sought second opinion at [facility] and underwent uncomplicated multivessel CABG in early May 2023.  She also saw neurosurgery there regarding her carotid stenosis with plans to repeat a carotid ultrasound and pursue surgical revascularization if indicated.  July 2023: The patient is doing well.  She is participating in cardiac rehabilitation.  She has plans to have her  left carotid disease treated with a stent.  She feels like she is getting stronger.  She has not been using her Repatha .  She denies any significant chest pain, palpitations, paroxysmal nocturnal dyspnea, orthopnea, or shortness of breath.  She feels very well.  She does report a cough and thinks it may be due to lisinopril .  Plan: Continue Plavix  for 6 months and then aspirin  monotherapy; stop lisinopril  due to cough and start losartan ; refer to pharmacy given elevated BMI.  October 2024: The patient is here due to preoperative recommendations for gynecologic surgery.  The patient did not follow-up as requested.  Another one of her doctor stopped her Jardiance  and she is not exactly sure why.  Following her CABG surgery she was participating in cardiac rehabilitation and was feeling quite well.  Unfortunately her husband developed some complex medical problems and he needed a lot of care from her.  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  She is able to walk and go up stairs without chest pain.  She does get short of breath mostly from deconditioning.  Her weight is up about 20 pounds since I saw her last.  She has had no signs or symptoms of stroke.  She did develop abnormal uterine bleeding and was unfortunately diagnosed with uterine adenocarcinoma.  She is scheduled for a total hysterectomy.  For this reason she was seen today for recommendations regarding her dual antiplatelet therapy.  She had been placed on Plavix  by neurosurgeon some years ago with no definite plan.  She has not seen this provider in some time.  In terms of other medications she was off Repatha  for some time due to cost issues.  She is back on it.  She did have a lipid panel drawn when she was not taking it in June which showed an LDL of 181.  Plan:  TTE, stop plavix , start Jardiance  10, refer to pharm D for GLP1RA, increase losartan  50mg , check BMP in one week, and refer to vascular surgery  for carotid disease.  May 2025:  Patient consents to use of AI scribe. The patient saw PharmD and elected to defer GLP1RA due to GI issues after her hysterectomy.  She saw Vascular Surgery her and elected to be cared for at [facility].  She has been off Repatha  due to surgeries but is open to resuming it, having experienced no side effects. Her current medications include aspirin , Zetia , losartan , and metoprolol . Blood pressure was 122/80 last week. She is taking losartan  50 mg.  She has consulted Dr. Christia Cowboy and Dr. Sumner Ends regarding issues with the arteries in her neck. Dr. Christia Cowboy could not perform surgery due to anatomical concerns, and Dr. Fargan suggested a different approach but has been difficult to contact. This issue has been deprioritized due to recent surgeries.  She denies any chest pain, significant shortness of breath, or peripheral edema.  Once she had her kidney stones manage she has felt much better.          Current Medications: Current Meds  Medication Sig  aspirin  EC 81 MG tablet Take 81 mg by mouth daily. Swallow whole.   Cholecalciferol (VITAMIN D3) 250 MCG (10000 UT) TABS Take 10,000 Units by mouth every Sunday.   empagliflozin  (JARDIANCE ) 10 MG TABS tablet Take 1 tablet (10 mg total) by mouth daily.   ezetimibe  (ZETIA ) 10 MG tablet Take 1 tablet (10 mg total) by mouth daily.   hydroxypropyl methylcellulose / hypromellose (ISOPTO TEARS / GONIOVISC) 2.5 % ophthalmic solution Place 1 drop into both eyes as needed for dry eyes.   ibuprofen (ADVIL) 200 MG tablet Take 800 mg by mouth 2 (two) times daily.   metFORMIN  (GLUCOPHAGE -XR) 500 MG 24 hr tablet Take 500 mg by mouth 2 (two) times daily.   Metoprolol  Tartrate 37.5 MG TABS Take 37.5 mg by mouth 2 (two) times daily.   OVER THE COUNTER MEDICATION Take 1 capsule by mouth 2 (two) times daily. Doterra Tri-Ease (for allergies)   sertraline  (ZOLOFT ) 50 MG tablet Take 1 tablet (50 mg total) by mouth daily.    tamsulosin (FLOMAX) 0.4 MG CAPS capsule Take 0.4 mg by mouth at bedtime.   [DISCONTINUED] losartan  (COZAAR ) 50 MG tablet Take 1 tablet (50 mg total) by mouth daily.     Review of Systems:   Please see the history of present illness.    All other systems reviewed and are negative.     EKGs/Labs/Other Test Reviewed:   EKG: EKG that I personally reviewed from October 2024 demonstrates sinus rhythm  EKG Interpretation Date/Time:    Ventricular Rate:    PR Interval:    QRS Duration:    QT Interval:    QTC Calculation:   R Axis:      Text Interpretation:           Risk Assessment/Calculations:          Physical Exam:   VS:  BP (!) 140/90   Pulse 73   Ht 5\' 5"  (1.651 m)   Wt (!) 354 lb (160.6 kg)   LMP 01/06/2012   SpO2 97%   BMI 58.91 kg/m    HYPERTENSION CONTROL Vitals:   01/31/24 0816 01/31/24 0848  BP: (!) 166/122 (!) 140/90    The patient's blood pressure is elevated above target today.  In order to address the patient's elevated BP:       Wt Readings from Last 3 Encounters:  01/31/24 (!) 354 lb (160.6 kg)  01/17/24 (!) 354 lb (160.6 kg)  01/05/24 (!) 350 lb 6.4 oz (158.9 kg)      GENERAL:  No apparent distress, AOx3 HEENT:  Bilateral carotid bruits, +2 carotid impulses, no scleral icterus CAR: RRR no murmurs, gallops, rubs, or thrills RES:  Clear to auscultation bilaterally ABD:  Soft, nontender, nondistended, positive bowel sounds x 4 VASC:  +2 radial pulses, +2 carotid pulses NEURO:  CN 2-12 grossly intact; motor and sensory grossly intact PSYCH:  No active depression or anxiety EXT:  No edema, ecchymosis, or cyanosis  Signed, Khylee Algeo K Aubryana Vittorio, MD  01/31/2024 8:51 AM    [facility] [address] [place], [place], [place]  [address] Phone: [phone]; Fax: [phone]   Note:  This document was prepared using Dragon voice recognition software and may include unintentional dictation errors.

## 2024-01-31 ENCOUNTER — Ambulatory Visit: Attending: Internal Medicine | Admitting: Internal Medicine

## 2024-01-31 ENCOUNTER — Encounter: Payer: Self-pay | Admitting: Internal Medicine

## 2024-01-31 VITALS — BP 140/90 | HR 73 | Ht 65.0 in | Wt 354.0 lb

## 2024-01-31 DIAGNOSIS — E1169 Type 2 diabetes mellitus with other specified complication: Secondary | ICD-10-CM

## 2024-01-31 DIAGNOSIS — E1165 Type 2 diabetes mellitus with hyperglycemia: Secondary | ICD-10-CM | POA: Diagnosis not present

## 2024-01-31 DIAGNOSIS — I700 Atherosclerosis of aorta: Secondary | ICD-10-CM

## 2024-01-31 DIAGNOSIS — Z951 Presence of aortocoronary bypass graft: Secondary | ICD-10-CM

## 2024-01-31 DIAGNOSIS — I6523 Occlusion and stenosis of bilateral carotid arteries: Secondary | ICD-10-CM

## 2024-01-31 DIAGNOSIS — Z6841 Body Mass Index (BMI) 40.0 and over, adult: Secondary | ICD-10-CM

## 2024-01-31 DIAGNOSIS — E1159 Type 2 diabetes mellitus with other circulatory complications: Secondary | ICD-10-CM

## 2024-01-31 DIAGNOSIS — I152 Hypertension secondary to endocrine disorders: Secondary | ICD-10-CM

## 2024-01-31 DIAGNOSIS — E785 Hyperlipidemia, unspecified: Secondary | ICD-10-CM

## 2024-01-31 MED ORDER — LOSARTAN POTASSIUM 50 MG PO TABS: 75.0000 mg | ORAL_TABLET | Freq: Every day | ORAL | 2 refills | Status: AC

## 2024-01-31 NOTE — Patient Instructions (Signed)
 Medication Instructions:  Your physician has recommended you make the following change in your medication:   1) INCREASE losartan  to 75 mg daily  *If you need a refill on your cardiac medications before your next appointment, please call your pharmacy*  Lab Work: Next week: CMP and Lipid panel If you have labs (blood work) drawn today and your tests are completely normal, you will receive your results only by: MyChart Message (if you have MyChart) OR A paper copy in the mail If you have any lab test that is abnormal or we need to change your treatment, we will call you to review the results.  Follow-Up: At [facility], you and your health needs are our priority.  As part of our continuing mission to provide you with exceptional heart care, our providers are all part of one team.  This team includes your primary Cardiologist (physician) and Advanced Practice Providers or APPs (Physician Assistants and Nurse Practitioners) who all work together to provide you with the care you need, when you need it.  Your next appointment:   6 month(s)  Provider:   Lovette Rud, PA-C, Charles Connor, NP, Graciela Lava, PA-C, Theotis Flake, PA-C, Marlyse Single, PA-C, or Leala Prince, PA-C      We recommend signing up for the patient portal called "MyChart".  Sign up information is provided on this After Visit Summary.  MyChart is used to connect with patients for Virtual Visits (Telemedicine).  Patients are able to view lab/test results, encounter notes, upcoming appointments, etc.  Non-urgent messages can be sent to your provider as well.   To learn more about what you can do with MyChart, go to [email or web address].

## 2024-02-10 ENCOUNTER — Telehealth: Payer: Self-pay | Admitting: Pharmacy Technician

## 2024-02-10 ENCOUNTER — Other Ambulatory Visit (HOSPITAL_COMMUNITY): Payer: Self-pay

## 2024-02-10 ENCOUNTER — Telehealth: Payer: Self-pay | Admitting: Internal Medicine

## 2024-02-10 NOTE — Telephone Encounter (Signed)
 PA request has been Submitted. New Encounter has been or will be created for follow up. For additional info see Pharmacy Prior Auth telephone encounter from 02/10/24.

## 2024-02-10 NOTE — Telephone Encounter (Signed)
 Pharmacy Patient Advocate Encounter   Received notification from Pt Calls Messages that prior authorization for REPATHA  is required/requested.   Insurance verification completed.   The patient is insured through [facility] .   Per test claim: PA required; PA submitted to above mentioned insurance via CoverMyMeds Key/confirmation #/EOC [id number] Status is pending

## 2024-02-10 NOTE — Telephone Encounter (Signed)
 Pharmacy calling to see if fax rec'vd for PA for Repatha  . When cb please provide Rx# [phone]

## 2024-02-11 NOTE — Telephone Encounter (Signed)
 Faxed more info. Ins made it be a continuation pa even though she stopped and is restarting.

## 2024-02-11 NOTE — Telephone Encounter (Signed)
 Pharmacy Patient Advocate Encounter  Received notification from [facility] that Prior Authorization for repatha  has been APPROVED from 02/10/24 to 02/09/25. Spoke to pharmacy to process.Copay is $0.00.

## 2024-04-02 ENCOUNTER — Encounter: Payer: Self-pay | Admitting: Internal Medicine

## 2024-04-03 ENCOUNTER — Other Ambulatory Visit (HOSPITAL_COMMUNITY): Payer: Self-pay

## 2024-04-03 ENCOUNTER — Other Ambulatory Visit: Payer: Self-pay

## 2024-04-03 MED ORDER — EZETIMIBE 10 MG PO TABS
10.0000 mg | ORAL_TABLET | Freq: Every day | ORAL | 3 refills | Status: DC
  Filled 2024-04-03: qty 90, 90d supply, fill #0

## 2024-04-04 ENCOUNTER — Encounter: Payer: Self-pay | Admitting: Internal Medicine

## 2024-04-04 ENCOUNTER — Other Ambulatory Visit (HOSPITAL_COMMUNITY): Payer: Self-pay

## 2024-04-04 MED ORDER — EZETIMIBE 10 MG PO TABS: 10.0000 mg | ORAL_TABLET | Freq: Every day | ORAL | 3 refills | Status: AC

## 2024-04-05 ENCOUNTER — Other Ambulatory Visit (HOSPITAL_COMMUNITY): Payer: Self-pay

## 2024-04-06 ENCOUNTER — Other Ambulatory Visit (HOSPITAL_COMMUNITY): Payer: Self-pay

## 2024-04-10 ENCOUNTER — Ambulatory Visit: Admitting: Internal Medicine

## 2024-04-12 ENCOUNTER — Other Ambulatory Visit (HOSPITAL_COMMUNITY): Payer: Self-pay

## 2024-07-13 ENCOUNTER — Encounter: Payer: Self-pay | Admitting: Psychiatry

## 2024-07-17 ENCOUNTER — Encounter: Payer: Self-pay | Admitting: Radiology

## 2024-07-17 ENCOUNTER — Encounter: Payer: Self-pay | Admitting: Psychiatry

## 2024-07-17 ENCOUNTER — Inpatient Hospital Stay: Attending: Psychiatry | Admitting: Psychiatry

## 2024-07-17 VITALS — BP 134/56 | HR 79 | Temp 98.7°F | Resp 19 | Wt 363.0 lb

## 2024-07-17 DIAGNOSIS — Z90710 Acquired absence of both cervix and uterus: Secondary | ICD-10-CM | POA: Insufficient documentation

## 2024-07-17 DIAGNOSIS — Z9079 Acquired absence of other genital organ(s): Secondary | ICD-10-CM | POA: Insufficient documentation

## 2024-07-17 DIAGNOSIS — Z90722 Acquired absence of ovaries, bilateral: Secondary | ICD-10-CM | POA: Diagnosis not present

## 2024-07-17 DIAGNOSIS — Z8542 Personal history of malignant neoplasm of other parts of uterus: Secondary | ICD-10-CM | POA: Diagnosis not present

## 2024-07-17 DIAGNOSIS — C541 Malignant neoplasm of endometrium: Secondary | ICD-10-CM

## 2024-07-17 NOTE — Progress Notes (Signed)
 Gynecologic Oncology Return Clinic Visit  Date of Service: 07/17/2024 Referring Provider: Robbi Render, MD   Assessment & Plan: Penny Huerta is a 58 y.o. 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  Endometrial cancer: - No high-intermediate risk factors. SLNBx not performed. Pathology would not have met Mayo criteria. - Given no lymph node evaluation, postop CT performed and negative (08/30/23). - Given some limitations of exam due to body habitus, no lymph node evaluation and episodes of vaginal bleeding, CT a/p repeated on 01/21/24 and NED. Repeat in 1 year (~01/2024). - NED on exam today. - Signs/symptoms of recurrence reviewed. - Continue surveillance with follow-up q6 months x2 years then yearly.  - Reviewed that after 5 years NED, will be safe to return to Ob/Gyn.   RTC 34mo  Hoy Masters, MD Gynecologic Oncology   Medical Decision Making I personally spent  TOTAL 15 minutes face-to-face and non-face-to-face in the care of this patient, which includes all pre, intra, and post visit time on the date of service.  ----------------------- Reason for Visit: Surveillance  Treatment History: Oncology History  Endometrial cancer, grade I (HCC)  05/28/2023 Initial Biopsy   Endometrial biopsy: Well-differentiated endometrial adenocarcinoma (FIGO I)   05/28/2023 Initial Diagnosis   Endometrial cancer, grade I (HCC)   06/07/2023 Imaging   Pelvic ultrasound: Impression: Technically difficult study due to patient BMI There appears to be a post left intramural fibroid 2.1 x 2.1 x 1.8 cm. The endometrium appears focally thickened towards the fundus measuring 16.4 mm.  Cannot rule out polyp versus other. Limited TAS was added to evaluate  ovaries. The right ovary was not visualized. No free fluid.   07/06/2023 Cancer Staging   Staging form: Corpus Uteri - Carcinoma and Carcinosarcoma, AJCC 8th Edition - Clinical stage from 07/06/2023: FIGO Stage IA, calculated as Stage Unknown (cT1a, cNX, cM0) - Signed by Masters Hoy, MD on 07/19/2023 Histopathologic type: Endometrioid adenocarcinoma, NOS Stage prefix: Initial diagnosis Histologic grade (G): G1 Histologic grading system: 3 grade system   07/06/2023 Surgery   Robotic-assisted total laparoscopic hysterectomy, bilateral salpingo-oophorectomy, cystoscopy   Findings: Upper abdominal survey with nodular appearance of liver, possible fatty liver. Normal diaphragm. Normal appearing small and large bowel. Increased visceral adiposity limiting visualization in the pelvis. Normal uterus, tubes, and left ovary.  Approximate 3cm adnexal mass on the right ovary. No evidence of peritoneal disease, ascites, or carcinomatosis.  Evaluation for sentinel lymph nodes deferred due to increased retroperitoneal and visceral adiposity significantly limiting visualization in the pelvis and limiting safety of retroperitoneal dissection.  Cystoscopy with normal efflux from bilateral ureteral orifices.      07/06/2023 Pathology Results   A. UTERUS, CERVIX, BILATERAL FALLOPIAN TUBES, AND OVARIES, RESECTION: - Endometrium: Focal endometrioid carcinoma, FIGO grade 1, with background extensive endometrial intraepithelial neoplasia - Myometrial invasion: Less than 50% - Cervix: Benign, nabothian cysts - Leiomyomata - Bilateral fallopian tubes: Benign, no significant pathologic changes - Right ovary: Benign monodermal teratoma - Left ovary: Benign, follicular cyst - see oncology table  ONCOLOGY TABLE:   UTERUS, CARCINOMA OR CARCINOSARCOMA: Resection  Procedure: Total hysterectomy and bilateral salpingo-oophorectomy Histologic Type: Endometrioid carcinoma, FIGO grade 1 Histologic Grade: Grade  1 Myometrial Invasion:      Depth of Myometrial Invasion (mm): 5 mm      Myometrial Thickness (mm):  15 mm      Percentage of Myometrial Invasion: 30% Uterine Serosa Involvement: Not identified Cervical stromal Involvement: Not identified Extent of involvement of other tissue/organs: Not identified Peritoneal/Ascitic Fluid: Not applicable Lymphovascular Invasion: Not identified Regional Lymph Nodes: Not applicable Distant Metastasis:      Distant Site(s) Involved: Not applicable Pathologic Stage Classification (pTNM, AJCC 8th Edition): pT 1a, pN not assigned Ancillary Studies: MMR / MSI testing will be ordered Representative Tumor Block: A3 Comment(s): P53 immunohistochemical stain is pending and will be reported in addendum   IHC EXPRESSION RESULTS  TEST           RESULT MLH1:          Preserved nuclear expression MSH2:          Preserved nuclear expression MSH6:          Preserved nuclear expression PMS2:          Preserved nuclear expression      Interval History: Doing well. No changes. No new vaginal bleeding, abdominal/pelvic pain, unintentional weight loss, change in bowel or bladder habits, early satiety, bloating, nausea/vomiting.      Past Medical/Surgical History: Past Medical History:  Diagnosis Date   Allergy    Anxiety    Arthritis    Atrial fibrillation (HCC)    B12 deficiency    Carotid stenosis    Chest pain    Coronary artery disease    Depression    Diabetes mellitus without complication (HCC)    endometrial ca 05/2023   Family history of hyperlipidemia    Heart murmur    ARRHYTHMIA   History of drug use    History of kidney stones    Hx of CABG    Hyperlipidemia    Hypertension    Obesity    Pneumonia    SOB (shortness of breath)    Stroke [facility])     Past Surgical History:  Procedure Laterality Date   CORONARY ARTERY BYPASS GRAFT  01/31/2022   x 4   CYSTOSCOPY/URETEROSCOPY/HOLMIUM LASER/STENT PLACEMENT Bilateral 11/26/2023    Procedure: CYSTOSCOPY/LEFT URETEROSCOPY LEFT HOLMIUM LASER RIGHT RETROGRADE PYELOGRAM BILATERAL URETERAL STENT PLACEMENT;  Surgeon: Nieves Cough, MD;  Location: [facility];  Service: Urology;  Laterality: Bilateral;   CYSTOSCOPY/URETEROSCOPY/HOLMIUM LASER/STENT PLACEMENT Bilateral 01/04/2024   Procedure: CYSTOSCOPY/URETEROSCOPY/HOLMIUM LASER/STENT PLACEMENT;  Surgeon: Nieves Cough, MD;  Location: [facility];  Service: Urology;  Laterality: Bilateral;   DILATION AND CURETTAGE OF UTERUS     HYSTEROSCOPY W/ ENDOMETRIAL ABLATION  09/14/2009   x2   LEFT HEART CATH AND CORONARY ANGIOGRAPHY N/A 12/05/2021   Procedure: LEFT HEART CATH AND CORONARY ANGIOGRAPHY;  Surgeon: Wendel Lurena POUR, MD;  Location: [facility];  Service: Cardiovascular;  Laterality: N/A;   ROBOTIC ASSISTED TOTAL HYSTERECTOMY WITH BILATERAL SALPINGO OOPHERECTOMY N/A 07/06/2023   Procedure: XI ROBOTIC ASSISTED TOTAL HYSTERECTOMY WITH BILATERAL SALPINGO OOPHORECTOMY and CYSTOSCOPY;  Surgeon: Eldonna Mays, MD;  Location: [facility];  Service: Gynecology;  Laterality: N/A;    Family History  Problem Relation Age of Onset   Heart disease Mother    Stroke Mother    Hyperlipidemia Mother    Transient ischemic attack Mother    Hypertension Mother    Diabetes Mother    Depression Mother    Anxiety disorder Mother    Obesity Mother    Stroke Father    Transient ischemic attack Father    Sudden death Maternal Uncle    Heart disease Maternal Grandmother 75   Hyperlipidemia  Maternal Grandmother    Hyperlipidemia Maternal Grandfather    Heart disease Maternal Grandfather    Ovarian cancer Paternal Grandmother    Stroke Paternal Grandfather    Hypertension Paternal Grandfather    Diabetes Paternal Grandfather    Colon cancer Neg Hx    Breast cancer Neg Hx    Endometrial cancer Neg Hx    Pancreatic cancer Neg Hx    Prostate cancer Neg Hx     Social History   Socioeconomic History   Marital status: Married    Spouse  name: Not on file   Number of children: Not on file   Years of education: Not on file   Highest education level: Not on file  Occupational History   Occupation: print production planner  Tobacco Use   Smoking status: Former    Current packs/day: 0.00    Types: Cigarettes    Start date: 09/14/1977    Quit date: 09/14/1997    Years since quitting: 26.8   Smokeless tobacco: Never  Vaping Use   Vaping status: Never Used  Substance and Sexual Activity   Alcohol use: Never   Drug use: Not Currently    Comment: most of them - quit in 1994   Sexual activity: Not Currently    Partners: Male  Other Topics Concern   Not on file  Social History Narrative   Exercise-- none   Right handed   Social Drivers of Health   Financial Resource Strain: Medium Risk (10/03/2023)   Received from [facility]   Overall Financial Resource Strain (CARDIA)    Difficulty of Paying Living Expenses: Somewhat hard  Food Insecurity: Food Insecurity Present (10/03/2023)   Received from [facility]   Hunger Vital Sign    Within the past 12 months, you worried that your food would run out before you got the money to buy more.: Sometimes true    Within the past 12 months, the food you bought just didn't last and you didn't have money to get more.: Never true  Transportation Needs: No Transportation Needs (10/03/2023)   [facility] - Transportation    Lack of Transportation (Medical): No    Lack of Transportation (Non-Medical): No  Physical Activity: Insufficiently Active (10/03/2023)   Received from [facility]   Exercise Vital Sign    On average, how many days per week do you engage in moderate to strenuous exercise (like a brisk walk)?: 3 days    On average, how many minutes do you engage in exercise at this level?: 10 min  Stress: Stress Concern Present (10/03/2023)   Received from [facility] - Occupational Stress Questionnaire    Feeling of  Stress : To some extent  Social Connections: Moderately Integrated (10/03/2023)   Received from [facility]   Social Network    How would you rate your social network (family, work, friends)?: Adequate participation with social networks    Current Medications:  Current Outpatient Medications:    aspirin  EC 81 MG tablet, Take 81 mg by mouth daily. Swallow whole., Disp: , Rfl:    Cholecalciferol (VITAMIN D3) 250 MCG (10000 UT) TABS, Take 10,000 Units by mouth every Sunday., Disp: , Rfl:    ezetimibe  (ZETIA ) 10 MG tablet, Take 1 tablet (10 mg total) by mouth daily., Disp: 90 tablet, Rfl: 3   hydroxypropyl methylcellulose / hypromellose (ISOPTO TEARS / GONIOVISC) 2.5 % ophthalmic solution, Place 1 drop into both eyes  as needed for dry eyes., Disp: , Rfl:    ibuprofen (ADVIL) 200 MG tablet, Take 800 mg by mouth 2 (two) times daily., Disp: , Rfl:    losartan  (COZAAR ) 50 MG tablet, Take 1.5 tablets (75 mg total) by mouth daily., Disp: 135 tablet, Rfl: 2   metFORMIN  (GLUCOPHAGE -XR) 500 MG 24 hr tablet, Take 500 mg by mouth 2 (two) times daily., Disp: , Rfl:    Metoprolol  Tartrate 37.5 MG TABS, Take 37.5 mg by mouth 2 (two) times daily., Disp: , Rfl:    ondansetron  (ZOFRAN ) 4 MG tablet, Take 4 mg by mouth., Disp: , Rfl:    OVER THE COUNTER MEDICATION, Take 1 capsule by mouth 2 (two) times daily. Doterra Tri-Ease (for allergies), Disp: , Rfl:    sertraline  (ZOLOFT ) 50 MG tablet, Take 1 tablet (50 mg total) by mouth daily., Disp: 90 tablet, Rfl: 0   empagliflozin  (JARDIANCE ) 10 MG TABS tablet, Take 1 tablet (10 mg total) by mouth daily. (Patient not taking: Reported on 07/13/2024), Disp: 30 tablet, Rfl: 11  Review of Symptoms: Complete 10-system review is negative except as above in Interval History.  Physical Exam: LMP 01/06/2012  General: Alert, oriented, no acute distress. HEENT: Normocephalic, atraumatic. Neck symmetric without masses. Sclera anicteric.  Chest: Normal work of breathing. Clear  to auscultation bilaterally.   Cardiovascular: Regular rate and rhythm, no murmurs. Abdomen: Soft, nontender.  Normoactive bowel sounds.  No masses appreciated.  Well-healed incisions. Extremities: Grossly normal range of motion.  Warm, well perfused.  No edema bilaterally. Skin: No rashes or lesions noted. Lymphatics: No cervical, supraclavicular, or inguinal adenopathy. GU: Normal appearing external genitalia without erythema, excoriation, or lesions.  Speculum exam reveals normal vaginal mucosa and cuff.  Bimanual exam reveals smooth vaginal mucosa, no nodularity or pelvic mass, limited by body habitus.  Exam chaperoned by Kimberly Jordan, CMA      Laboratory & Radiologic Studies: CT ABDOMEN PELVIS W CONTRAST 01/21/2024  Narrative EXAMINATION: CT ABDOMEN PELVIS W CONTRAST  CLINICAL INDICATION: Female, 58 years old. Genital cancer, monitor  TECHNIQUE: Axial CT of the abdomen and pelvis with 100 cc Omnipaque  300 intravenous contrast. Multiplanar reformations provided. Unless otherwise specified, incidental thyroid, adrenal, renal lesions do not require dedicated imaging follow up. Additionally, any mentioned pulmonary nodules do not require dedicated imaging follow-up based on the Fleischner guidelines unless otherwise specified. Coronary calcifications are not identified unless otherwise specified.  COMPARISON: 08/30/2023  FINDINGS:  The lung bases are clear. The heart is normal in size. There are coronary calcifications. There is hepatic steatosis with mild hepatomegaly. There is cholelithiasis. The spleen is normal. The pancreas is normal. The adrenals are normal. The right kidney is normal. There are small nonobstructing left intrarenal stones. Abdominal aorta is normal in caliber. Scattered atherosclerotic changes are present. The urinary bladder is compressed. The uterus is surgically absent. There is colonic diverticulosis. The appendix is normal. Large and small bowel  loops are otherwise within normal limits. There is no free fluid or suspicious lymphadenopathy. No concerning osseous lesions. There are degenerative changes of the spine.  IMPRESSION:  Status post hysterectomy with no evidence for malignancy within the abdomen or pelvis.  DOSE REDUCTION: This exam was performed according to our departmental dose-optimization program which includes automated exposure control, adjustment of the mA and/or kV according to patient size and/or use of iterative reconstruction technique.  Electronically signed by: Chad Engel MD 01/22/2024 11:04 AM EDT RP Workstation: [id number]

## 2024-07-17 NOTE — Patient Instructions (Signed)
 It was a pleasure to see you in clinic today. - Normal exam today - Return visit planned for 6 mo with CT scan prior  Thank you very much for allowing me to provide care for you today.  I appreciate your confidence in choosing our Gynecologic Oncology team at [facility].  If you have any questions about your visit today please call our office or send us  a MyChart message and we will get back to you as soon as possible.

## 2024-08-13 ENCOUNTER — Other Ambulatory Visit: Payer: Self-pay | Admitting: Internal Medicine

## 2024-08-13 DIAGNOSIS — E1165 Type 2 diabetes mellitus with hyperglycemia: Secondary | ICD-10-CM

## 2024-08-17 ENCOUNTER — Encounter: Payer: Self-pay | Admitting: Internal Medicine

## 2024-08-17 DIAGNOSIS — E1165 Type 2 diabetes mellitus with hyperglycemia: Secondary | ICD-10-CM

## 2024-08-17 MED ORDER — EMPAGLIFLOZIN 10 MG PO TABS: 10.0000 mg | ORAL_TABLET | Freq: Every day | ORAL | 2 refills | Status: AC

## 2025-01-15 ENCOUNTER — Other Ambulatory Visit (HOSPITAL_COMMUNITY)

## 2025-01-22 ENCOUNTER — Inpatient Hospital Stay: Attending: Psychiatry | Admitting: Psychiatry
